# Patient Record
Sex: Female | Born: 1945 | Race: White | Hispanic: No | Marital: Married | Smoking: Never smoker
Health system: Southern US, Community
[De-identification: ages and names within clinical notes are randomized; demographics above are authoritative.]

## PROBLEM LIST (undated history)

## (undated) DIAGNOSIS — E871 Hypo-osmolality and hyponatremia: Secondary | ICD-10-CM

## (undated) DIAGNOSIS — E785 Hyperlipidemia, unspecified: Secondary | ICD-10-CM

## (undated) DIAGNOSIS — E039 Hypothyroidism, unspecified: Secondary | ICD-10-CM

## (undated) DIAGNOSIS — I10 Essential (primary) hypertension: Secondary | ICD-10-CM

## (undated) DIAGNOSIS — K635 Polyp of colon: Secondary | ICD-10-CM

## (undated) DIAGNOSIS — R001 Bradycardia, unspecified: Secondary | ICD-10-CM

## (undated) DIAGNOSIS — R609 Edema, unspecified: Secondary | ICD-10-CM

## (undated) DIAGNOSIS — Z950 Presence of cardiac pacemaker: Secondary | ICD-10-CM

## (undated) DIAGNOSIS — F419 Anxiety disorder, unspecified: Secondary | ICD-10-CM

## (undated) DIAGNOSIS — K649 Unspecified hemorrhoids: Secondary | ICD-10-CM

## (undated) DIAGNOSIS — J302 Other seasonal allergic rhinitis: Secondary | ICD-10-CM

## (undated) DIAGNOSIS — S83209A Unspecified tear of unspecified meniscus, current injury, unspecified knee, initial encounter: Secondary | ICD-10-CM

## (undated) DIAGNOSIS — M169 Osteoarthritis of hip, unspecified: Secondary | ICD-10-CM

## (undated) DIAGNOSIS — C4491 Basal cell carcinoma of skin, unspecified: Secondary | ICD-10-CM

## (undated) DIAGNOSIS — K5790 Diverticulosis of intestine, part unspecified, without perforation or abscess without bleeding: Secondary | ICD-10-CM

## (undated) DIAGNOSIS — R55 Syncope and collapse: Secondary | ICD-10-CM

## (undated) DIAGNOSIS — K219 Gastro-esophageal reflux disease without esophagitis: Secondary | ICD-10-CM

## (undated) DIAGNOSIS — N6019 Diffuse cystic mastopathy of unspecified breast: Secondary | ICD-10-CM

## (undated) DIAGNOSIS — E119 Type 2 diabetes mellitus without complications: Secondary | ICD-10-CM

## (undated) HISTORY — DX: Hypothyroidism, unspecified: E03.9

## (undated) HISTORY — DX: Basal cell carcinoma of skin, unspecified: C44.91

## (undated) HISTORY — DX: Gastro-esophageal reflux disease without esophagitis: K21.9

## (undated) HISTORY — PX: TONSILLECTOMY: SUR1361

## (undated) HISTORY — PX: OTHER SURGICAL HISTORY: SHX169

## (undated) HISTORY — PX: CHOLECYSTECTOMY: SHX55

## (undated) HISTORY — DX: Edema, unspecified: R60.9

## (undated) HISTORY — DX: Unspecified tear of unspecified meniscus, current injury, unspecified knee, initial encounter: S83.209A

## (undated) HISTORY — DX: Polyp of colon: K63.5

## (undated) HISTORY — DX: Diffuse cystic mastopathy of unspecified breast: N60.19

## (undated) HISTORY — DX: Bradycardia, unspecified: R00.1

## (undated) HISTORY — DX: Type 2 diabetes mellitus without complications: E11.9

## (undated) HISTORY — PX: KNEE ARTHROSCOPY: SUR90

## (undated) HISTORY — DX: Syncope and collapse: R55

## (undated) HISTORY — DX: Hyperlipidemia, unspecified: E78.5

## (undated) HISTORY — DX: Other seasonal allergic rhinitis: J30.2

## (undated) HISTORY — DX: Diverticulosis of intestine, part unspecified, without perforation or abscess without bleeding: K57.90

## (undated) HISTORY — DX: Essential (primary) hypertension: I10

## (undated) HISTORY — DX: Unspecified hemorrhoids: K64.9

## (undated) HISTORY — DX: Hypo-osmolality and hyponatremia: E87.1

## (undated) HISTORY — PX: TUBAL LIGATION: SHX77

## (undated) HISTORY — DX: Osteoarthritis of hip, unspecified: M16.9

---

## 1998-11-26 ENCOUNTER — Other Ambulatory Visit: Admission: RE | Admit: 1998-11-26 | Discharge: 1998-11-26 | Payer: Self-pay | Admitting: *Deleted

## 2000-04-12 ENCOUNTER — Other Ambulatory Visit: Admission: RE | Admit: 2000-04-12 | Discharge: 2000-04-12 | Payer: Self-pay | Admitting: Obstetrics and Gynecology

## 2000-05-11 ENCOUNTER — Other Ambulatory Visit: Admission: RE | Admit: 2000-05-11 | Discharge: 2000-05-11 | Payer: Self-pay | Admitting: Obstetrics and Gynecology

## 2001-02-01 ENCOUNTER — Encounter: Payer: Self-pay | Admitting: *Deleted

## 2001-02-01 ENCOUNTER — Ambulatory Visit (HOSPITAL_COMMUNITY): Admission: RE | Admit: 2001-02-01 | Discharge: 2001-02-01 | Payer: Self-pay | Admitting: *Deleted

## 2001-05-05 ENCOUNTER — Ambulatory Visit (HOSPITAL_COMMUNITY): Admission: RE | Admit: 2001-05-05 | Discharge: 2001-05-05 | Payer: Self-pay | Admitting: Family Medicine

## 2001-05-05 ENCOUNTER — Encounter: Payer: Self-pay | Admitting: Family Medicine

## 2001-06-21 ENCOUNTER — Other Ambulatory Visit: Admission: RE | Admit: 2001-06-21 | Discharge: 2001-06-21 | Payer: Self-pay | Admitting: Obstetrics and Gynecology

## 2001-10-10 ENCOUNTER — Ambulatory Visit (HOSPITAL_COMMUNITY): Admission: RE | Admit: 2001-10-10 | Discharge: 2001-10-10 | Payer: Self-pay | Admitting: *Deleted

## 2001-10-10 ENCOUNTER — Encounter: Payer: Self-pay | Admitting: *Deleted

## 2002-06-26 ENCOUNTER — Other Ambulatory Visit: Admission: RE | Admit: 2002-06-26 | Discharge: 2002-06-26 | Payer: Self-pay | Admitting: Obstetrics and Gynecology

## 2002-08-20 ENCOUNTER — Encounter: Admission: RE | Admit: 2002-08-20 | Discharge: 2002-11-18 | Payer: Self-pay | Admitting: *Deleted

## 2002-11-25 ENCOUNTER — Inpatient Hospital Stay (HOSPITAL_COMMUNITY): Admission: EM | Admit: 2002-11-25 | Discharge: 2002-11-28 | Payer: Self-pay | Admitting: Emergency Medicine

## 2002-11-25 ENCOUNTER — Encounter: Payer: Self-pay | Admitting: Emergency Medicine

## 2002-11-26 ENCOUNTER — Encounter: Payer: Self-pay | Admitting: Internal Medicine

## 2002-11-27 ENCOUNTER — Encounter: Payer: Self-pay | Admitting: Internal Medicine

## 2003-01-04 ENCOUNTER — Encounter: Payer: Self-pay | Admitting: Internal Medicine

## 2003-02-05 ENCOUNTER — Encounter: Admission: RE | Admit: 2003-02-05 | Discharge: 2003-05-06 | Payer: Self-pay | Admitting: *Deleted

## 2003-07-03 ENCOUNTER — Other Ambulatory Visit: Admission: RE | Admit: 2003-07-03 | Discharge: 2003-07-03 | Payer: Self-pay | Admitting: Obstetrics and Gynecology

## 2004-07-09 ENCOUNTER — Other Ambulatory Visit: Admission: RE | Admit: 2004-07-09 | Discharge: 2004-07-09 | Payer: Self-pay | Admitting: Obstetrics and Gynecology

## 2004-09-11 ENCOUNTER — Ambulatory Visit (HOSPITAL_COMMUNITY): Admission: RE | Admit: 2004-09-11 | Discharge: 2004-09-11 | Payer: Self-pay | Admitting: Family Medicine

## 2005-07-29 ENCOUNTER — Other Ambulatory Visit: Admission: RE | Admit: 2005-07-29 | Discharge: 2005-07-29 | Payer: Self-pay | Admitting: Obstetrics and Gynecology

## 2006-01-14 ENCOUNTER — Ambulatory Visit: Payer: Self-pay | Admitting: Internal Medicine

## 2006-03-03 ENCOUNTER — Ambulatory Visit: Payer: Self-pay | Admitting: Internal Medicine

## 2006-08-02 ENCOUNTER — Other Ambulatory Visit: Admission: RE | Admit: 2006-08-02 | Discharge: 2006-08-02 | Payer: Self-pay | Admitting: Obstetrics and Gynecology

## 2006-08-09 ENCOUNTER — Encounter: Admission: RE | Admit: 2006-08-09 | Discharge: 2006-08-09 | Payer: Self-pay | Admitting: Obstetrics and Gynecology

## 2007-02-01 ENCOUNTER — Encounter: Admission: RE | Admit: 2007-02-01 | Discharge: 2007-02-01 | Payer: Self-pay | Admitting: General Surgery

## 2007-08-03 ENCOUNTER — Other Ambulatory Visit: Admission: RE | Admit: 2007-08-03 | Discharge: 2007-08-03 | Payer: Self-pay | Admitting: Obstetrics and Gynecology

## 2007-08-30 ENCOUNTER — Encounter: Admission: RE | Admit: 2007-08-30 | Discharge: 2007-08-30 | Payer: Self-pay | Admitting: Obstetrics and Gynecology

## 2007-09-21 ENCOUNTER — Emergency Department (HOSPITAL_COMMUNITY): Admission: EM | Admit: 2007-09-21 | Discharge: 2007-09-21 | Payer: Self-pay | Admitting: Emergency Medicine

## 2008-04-04 ENCOUNTER — Ambulatory Visit (HOSPITAL_COMMUNITY): Admission: RE | Admit: 2008-04-04 | Discharge: 2008-04-04 | Payer: Self-pay | Admitting: Family Medicine

## 2008-04-26 ENCOUNTER — Ambulatory Visit (HOSPITAL_COMMUNITY): Admission: RE | Admit: 2008-04-26 | Discharge: 2008-04-26 | Payer: Self-pay | Admitting: Family Medicine

## 2008-05-01 ENCOUNTER — Ambulatory Visit: Payer: Self-pay | Admitting: Internal Medicine

## 2008-05-29 ENCOUNTER — Encounter: Payer: Self-pay | Admitting: Internal Medicine

## 2008-05-29 ENCOUNTER — Ambulatory Visit: Payer: Self-pay | Admitting: Internal Medicine

## 2008-06-07 ENCOUNTER — Encounter: Payer: Self-pay | Admitting: Internal Medicine

## 2008-08-05 ENCOUNTER — Other Ambulatory Visit: Admission: RE | Admit: 2008-08-05 | Discharge: 2008-08-05 | Payer: Self-pay | Admitting: Obstetrics and Gynecology

## 2008-08-30 ENCOUNTER — Encounter: Admission: RE | Admit: 2008-08-30 | Discharge: 2008-08-30 | Payer: Self-pay | Admitting: Obstetrics and Gynecology

## 2008-10-28 ENCOUNTER — Encounter: Payer: Self-pay | Admitting: Internal Medicine

## 2008-11-13 ENCOUNTER — Encounter: Payer: Self-pay | Admitting: Internal Medicine

## 2008-12-05 ENCOUNTER — Ambulatory Visit: Payer: Self-pay | Admitting: Internal Medicine

## 2008-12-05 DIAGNOSIS — R079 Chest pain, unspecified: Secondary | ICD-10-CM | POA: Insufficient documentation

## 2008-12-05 DIAGNOSIS — K219 Gastro-esophageal reflux disease without esophagitis: Secondary | ICD-10-CM

## 2008-12-05 DIAGNOSIS — K589 Irritable bowel syndrome without diarrhea: Secondary | ICD-10-CM | POA: Insufficient documentation

## 2009-07-03 ENCOUNTER — Ambulatory Visit: Payer: Self-pay | Admitting: Internal Medicine

## 2009-07-03 DIAGNOSIS — R109 Unspecified abdominal pain: Secondary | ICD-10-CM | POA: Insufficient documentation

## 2009-07-29 ENCOUNTER — Encounter: Admission: RE | Admit: 2009-07-29 | Discharge: 2009-07-29 | Payer: Self-pay | Admitting: Obstetrics and Gynecology

## 2009-08-06 ENCOUNTER — Other Ambulatory Visit: Admission: RE | Admit: 2009-08-06 | Discharge: 2009-08-06 | Payer: Self-pay | Admitting: Obstetrics and Gynecology

## 2009-10-06 ENCOUNTER — Telehealth: Payer: Self-pay | Admitting: Internal Medicine

## 2010-01-01 ENCOUNTER — Ambulatory Visit: Payer: Self-pay | Admitting: Internal Medicine

## 2010-01-01 DIAGNOSIS — R197 Diarrhea, unspecified: Secondary | ICD-10-CM

## 2010-01-01 DIAGNOSIS — R159 Full incontinence of feces: Secondary | ICD-10-CM

## 2010-01-01 DIAGNOSIS — B356 Tinea cruris: Secondary | ICD-10-CM | POA: Insufficient documentation

## 2010-02-19 ENCOUNTER — Ambulatory Visit: Payer: Self-pay | Admitting: Internal Medicine

## 2010-08-04 ENCOUNTER — Ambulatory Visit: Payer: Self-pay | Admitting: Internal Medicine

## 2010-08-04 DIAGNOSIS — K5900 Constipation, unspecified: Secondary | ICD-10-CM | POA: Insufficient documentation

## 2010-08-07 ENCOUNTER — Encounter: Admission: RE | Admit: 2010-08-07 | Discharge: 2010-08-07 | Payer: Self-pay | Admitting: Obstetrics and Gynecology

## 2010-08-10 ENCOUNTER — Ambulatory Visit: Payer: Self-pay | Admitting: Internal Medicine

## 2010-08-11 ENCOUNTER — Other Ambulatory Visit: Admission: RE | Admit: 2010-08-11 | Discharge: 2010-08-11 | Payer: Self-pay | Admitting: Obstetrics and Gynecology

## 2010-09-08 ENCOUNTER — Encounter: Payer: Self-pay | Admitting: Internal Medicine

## 2010-09-23 ENCOUNTER — Telehealth: Payer: Self-pay | Admitting: Internal Medicine

## 2010-10-20 ENCOUNTER — Encounter: Payer: Self-pay | Admitting: Internal Medicine

## 2010-12-03 NOTE — Progress Notes (Signed)
Summary: abnormal anorectal mano  Phone Note Outgoing Call   Summary of Call: let her know that anorectal maonometry shows abnormal function of the rectum - this can be helped by biofeedback training and a physical therapist in [place] can help her I would like to refer her for that. I am not sure if she was seen for more than anorectal mano at [facility], please clarify this and about PT referral Iva Boop MD, [facility]  September 23, 2010 5:27 PM    Follow-up for Phone Call        pt is notified of mano results.  She is unsure about PT.  She does not know what it entails and I was unable to adequately explain it to her.  Pt would also like to know if her insurance will pay for those services.  Pt states she did not see a provider at [facility], she just had the anorectal manometry. Dr. Leone Payor, who do you recommend for PT if the patient chooses to persue and do we have any info on biofeedback? Follow-up by: Francee Piccolo CMA Duncan Dull),  September 29, 2010 2:16 PM  Additional Follow-up for Phone Call Additional follow up Details #1::        I am meeting Ruben Gottron (PT) tomorrow AM to discuss options). will follow-up Iva Boop MD, [facility]  September 29, 2010 4:41 PM  LM to [facility] at home number Francee Piccolo CMA Duncan Dull)  September 30, 2010 9:50 AM   Pt notified of above and we will call her w/more info later.  Pt agreeable.  Additional Follow-up by: Francee Piccolo CMA Duncan Dull),  September 30, 2010 4:12 PM    Additional Follow-up for Phone Call Additional follow up Details #2::    I explained the biofeedback PT to patient and she is willijg to try. she would like to start before end of year if possible and [place] costs as she "will have to pay for everything" starting Jan 1. Did not guarantee that but at least perhaps she could get billing info, etc. 1) Fax an order for Physical therapy for pelvic floor dyssynergia to Ruben Gottron, PT at [facility] (fax # on card on my desk) 2)  Fax Amalia Greenhouse the last office note and the anorectal mano report.  Follow-up by: Iva Boop MD, Clementeen Graham,  October 01, 2010 6:38 PM  Additional Follow-up for Phone Call Additional follow up Details #3:: Details for Additional Follow-up Action Taken: APPT scheudled and pt notified.  Records faxed to [facility]. Additional Follow-up by: Francee Piccolo CMA Duncan Dull),  October 05, 2010 4:42 PM    MISC. Report  Procedure date:  09/08/2010  Findings:      ANORECTAL MANOMETRY:  Anal wink absent Balloon expulsion failed Good tone for squeeze on rectal pooor tone on rectal for bear down  hypotensive anal sphincter 43.1 mm Hg Squeeze anal sphincter pressure WNL 206.7 mm Hg RAIR present @10cc  Sensation @80  cc, Urge at 100 cc Discomfort at 150 cc Type I dyssynergia

## 2010-12-03 NOTE — Assessment & Plan Note (Signed)
Summary: FECAL INCONTINENCE....EM   History of Present Illness Visit Type: Follow-up Visit Primary GI MD: Stan Head MD [facility] Primary Provider: Durwin Nora, MD Requesting Provider: n/a Chief Complaint: Rectal bleeding, and fecal incontinence History of Present Illness:   65 yo woman last here in 07/2009 with abdominal wall pain that is better. Stools used to be formed but in Oct/Nov her stools changed such that the consistency is soft and formless and like melted chocolate. There is a problem cleansing adequately and it causes intermittent problems with walking due to perianal irritation from seepage.   She is ssing bright red  blood with wiping intermittently. She is not certain if she has had this before but if so it would have been years ago. She thinks fiber supplements relieved t then. There has been no change in medications or transient therapy or change in diet. She did start Benefiber in a change from chewable fiber tabs. Stopping fiber did not change this. They did firm up slightly with Benefiber.   GI Review of Systems      Denies abdominal pain, acid reflux, belching, bloating, chest pain, dysphagia with liquids, dysphagia with solids, heartburn, loss of appetite, nausea, vomiting, vomiting blood, weight loss, and  weight gain.      Reports fecal incontinence and  rectal bleeding.     Denies anal fissure, black tarry stools, change in bowel habit, constipation, diarrhea, diverticulosis, heme positive stool, hemorrhoids, irritable bowel syndrome, jaundice, light color stool, liver problems, and  rectal pain.    Current Medications (verified): 1)  Synthroid 50 Mcg Tabs (Levothyroxine Sodium) .Marland Kitchen.. 1 By Mouth Once Daily 2)  Crestor 5 Mg Tabs (Rosuvastatin Calcium) .Marland Kitchen.. 1 Tablet Mon & Thurs 3)  Adult Aspirin Ec Low Strength 81 Mg Tbec (Aspirin) .... Take 1 Tablet By Mouth Once A Day 4)  Lasix 20 Mg Tabs (Furosemide) .... As Needed 5)  Vitamin C 100 Mg Tabs (Ascorbic Acid) .... As  Directed 6)  Vitamin E 100 Unit Caps (Vitamin E) .... Take 1 Tablet By Mouth Once A Day 7)  Vitamin D 1000 Unit Tabs (Cholecalciferol) .... Take 1 Tablet By Mouth Once A Day 8)  Calcium + D 600-200 Mg-Unit Tabs (Calcium Carbonate-Vitamin D) .... Take 1 Tablet By Mouth Once A Day 9)  Azor 5-20 Mg Tabs (Amlodipine-Olmesartan) .... Take 1 Tablet By Mouth Once A Day 10)  Prilosec Otc 20 Mg Tbec (Omeprazole Magnesium) .Marland Kitchen.. 1 By Mouth Once Daily 11)  Zantac 300 Mg Tabs (Ranitidine Hcl) .Marland Kitchen.. 1 By Mouth Once Daily 12)  Benefiber  Powd (Wheat Dextrin) .... Twice Daily 13)  Sportscreme 10 % Crea (Trolamine Salicylate) .... Apply To Abdominal Wall 2 Times A Day  Allergies (verified): 1)  ! Amoxicillin 2)  ! Biaxin 3)  ! Hydrochlorothiazide  Past History:  Past Medical History: Diabetes Hyperlipidemia Hypertension Hypothyroidism Osteoarthritis GERD Irritable Bowel Syndrome Fecal Incontinence Some birth trauma with first of 3 vaginal deliveries Abdominal wall pain  Past Surgical History: Reviewed history from 07/03/2009 and no changes required. Cholecystectomy Tonsillectomy  Family History: Reviewed history from 07/03/2009 and no changes required. Family History of Pancreatic Cancer: Father Family History of Heart Disease: Brother Family History of Colon Cancer: Mother Family History of Diabetes: Uncle Family History of Liver Cancer: Mother?? mets from colon  Social History: Reviewed history from 07/03/2009 and no changes required. Patient has never smoked.  Illicit Drug Use - no Patient gets regular exercise. Alcohol Use - yes-rare Daily Caffeine Use-1 cup daily  Review of Systems       last TSH check would have been Oct and was ok as best she knows but is not certain it was checked still has intermittent chest pain related to foods and she took Nexium x few days only and then back to Prilosec  Vital Signs:  Patient profile:   65 year old female Height:      64  inches Weight:      190 pounds BMI:     32.73 BSA:     1.92 Pulse rate:   72 / minute Pulse rhythm:   regular BP sitting:   132 / 80  (left arm) Cuff size:   regular  Vitals Entered By: Ok Anis CMA (January 01, 2010 1:35 PM)  Physical Exam  General:  obese.  NAD Eyes:  anicteric Lungs:  Clear throughout to auscultation. Heart:  Regular rate and rhythm; no murmurs, rubs,  or bruits. Abdomen:  obese and non-tender no hsm or mass BS+ Rectal:  female staff present anoderm shows an erythematous perianal dermatitis slightly reduced sphincter tone at rest, no mass, soft brown stool. good voluntary squeeze. 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 TSH 2)  We will call you after we have lab results to arrange further follow up. 3)  Please pick up your medications at your pharmacy. NYSTATIN CREAM 4)  Hold your  benefiber. 5)  Copy sent to : Holley Bouche, MD 6)  The medication list was reviewed and reconciled.  All changed / newly prescribed medications were explained.  A complete medication list was provided to the patient / caregiver. Prescriptions: NYSTATIN-TRIAMCINOLONE 100000-0.1 UNIT/GM-% CREA (NYSTATIN-TRIAMCINOLONE) apply to anal area two times a day x 2 weeks and then as needed  #30 grams x 1   Entered and Authorized by:   Iva Boop MD, [facility]   Signed by:   Iva Boop MD, FACG on 01/01/2010   Method used:   Electronically to        [facility] [phone]* (retail)       [address]       [place], [place]  [address]       Ph: [phone]       Fax: [phone]   RxID:   [id number]  Patient: [name] Note: All result statuses are Final unless otherwise noted.  Tests: (1) TSH (TSH)   FastTSH  1.40 uIU/mL                 0.35-5.50  Note: An exclamation mark (!) indicates a result that was not dispersed into the flowsheet. Document Creation Date: 01/01/2010 4:36 PM

## 2010-12-03 NOTE — Assessment & Plan Note (Signed)
Summary: stomach problems....em   History of Present Illness Visit Type: Follow-up Visit Primary GI MD: Stan Head MD [facility] Primary Provider: Durwin Nora, MD Requesting Provider: n/a Chief Complaint: Follow up History of Present Illness:   Patient complains of some abdominal discomfort but nothing new to her. She states that she is having some constipation but her stools go from constipation to soft there is no median.  She gets constipated, will take a purge with citrate of Mgensia but then she begins to have fecal incontinence again and they leak out without warning. Small amount of stool intermittently. Once she notices that she will defecate and empty but still have incotinence again. she is unable to ttell me how many days of constipation there are, she says "it goes back and forth". She uses citrate of magnesia about 1x a month and is very concerned about regular laxative use. She thought that I had told her to stop Benefiber after last visit.  The perianal dermatitis comes and goes but responds to topical therapy.  while lifting a box over er head she developed a LUQ pain x 2  and has occasionally had similar problems with pushing lawnmower and sweeping Sports cream helps that but did not help much when she had the pain with the boxes.  She thinks Align is elping her "my stomach feels better when I to take  it" would like to try generic.   GI Review of Systems      Denies abdominal pain, acid reflux, belching, bloating, chest pain, dysphagia with liquids, dysphagia with solids, heartburn, loss of appetite, nausea, vomiting, vomiting blood, weight loss, and  weight gain.      Reports change in bowel habits and  constipation.     Denies anal fissure, black tarry stools, diarrhea, diverticulosis, fecal incontinence, heme positive stool, hemorrhoids, irritable bowel syndrome, jaundice, light color stool, liver problems, rectal bleeding, and  rectal pain.    Clinical Reports  Reviewed:  Colonoscopy:  05/29/2008:  Comments: 1) TWO DIMINUTIVE POLYPS REMOVED 2) SIGMOID DIVERTICULOSIS 3) VERY SMALL EXTERNAL HEMORRHOIDS 4) OTHERWISE NORMAL, EXCELLENT PREP 5) FAMILY HX COLON CANCER (ELDERLY MOM)  ***MICROSCOPIC EXAMINATION AND DIAGNOSIS***    COLON, SPLENIC FLEXURE/RECTUM, POLYPS:   - HYPERPLASTIC POLYP.   - SEPARATE PIECE OF POLYPOID COLORECTAL MUCOSA.   - THERE IS NO EVIDENCE OF MALIGNANCY.  EGD:  01/04/2003:  Comments: Mild-moderate non erosive gastritis. Otherwise normal. No cause of dysphagia seen.   Current Medications (verified): 1)  Synthroid 50 Mcg Tabs (Levothyroxine Sodium) .Marland Kitchen.. 1 By Mouth Once Daily 2)  Crestor 5 Mg Tabs (Rosuvastatin Calcium) .Marland Kitchen.. 1 Tablet Mon & Thurs 3)  Adult Aspirin Ec Low Strength 81 Mg Tbec (Aspirin) .... Take 1 Tablet By Mouth Once A Day 4)  Lasix 20 Mg Tabs (Furosemide) .... As Needed 5)  Vitamin C 100 Mg Tabs (Ascorbic Acid) .... As Directed 6)  Vitamin E 100 Unit Caps (Vitamin E) .... Take 1 Tablet By Mouth Once A Day 7)  Vitamin D 1000 Unit Tabs (Cholecalciferol) .... Take 1 Tablet By Mouth Once A Day 8)  Calcium + D 600-200 Mg-Unit Tabs (Calcium Carbonate-Vitamin D) .... Take 1 Tablet By Mouth Once A Day 9)  Azor 5-20 Mg Tabs (Amlodipine-Olmesartan) .... Take 1 Tablet By Mouth Once A Day 10)  Prilosec Otc 20 Mg Tbec (Omeprazole Magnesium) .Marland Kitchen.. 1 By Mouth Once Daily 11)  Zantac 300 Mg Tabs (Ranitidine Hcl) .Marland Kitchen.. 1 By Mouth Once Daily 12)  Benefiber  Powd (Wheat  Dextrin) .... Twice Daily--Hold 13)  Sportscreme 10 % Crea (Trolamine Salicylate) .... Apply To Abdominal Wall 2 Times A Day 14)  Align  Caps (Probiotic Product) .... One Capsule By Mouth Once Daily (Ran Out) 15)  Citrate of Magnesia  Soln (Magnesium Citrate) .... Use As Needed  Allergies (verified): 1)  ! Amoxicillin 2)  ! Biaxin 3)  ! Hydrochlorothiazide  Past History:  Past Medical History: Reviewed history from 01/01/2010 and no changes  required. 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 Cholecystectomy Tonsillectomy  Family History: Reviewed history from 07/03/2009 and no changes required. Family History of Pancreatic Cancer: Father Family History of Heart Disease: Brother Family History of Colon Cancer: Mother Family History of Diabetes: Uncle Family History of Liver Cancer: Mother?? mets from colon  Social History: Reviewed history from 07/03/2009 and no changes required. Patient has never smoked.  Illicit Drug Use - no Patient gets regular exercise. Alcohol Use - yes-rare Daily Caffeine Use-1 cup daily  Vital Signs:  Patient profile:   65 year old female Height:      64 inches Weight:      191.8 pounds BMI:     33.04 Pulse rate:   60 / minute Pulse rhythm:   regular BP sitting:   130 / 72  (left arm) Cuff size:   regular  Vitals Entered By: Harlow Mares CMA Duncan Dull) (August 04, 2010 3:55 PM)  Physical Exam  General:  obese.  NAD   Impression & Recommendations:  Problem # 1:  IRRITABLE BOWEL SYNDROME (ICD-564.1) Assessment Unchanged alternating habits continue, with periods of soft stools + fecal incontinence and then obstipation/constipation relieved by citrate of Magnesia she is fearful of regular laxatives "my mom was dependent" but is not satisfied with quality of life'either I miscommunicated about this or she misunderstood in past that regular fiber or laxatives could be what she needs - she was to resume Benefiber if constipation returmned per last note  sitz marks study planned to evaluate this will also refer to colonic motility expert for additional help in diagnostic evaluation and further therapy I will call patient once sitzmarks complete  Problem  # 2:  FULL INCONTINENCE OF 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 good voluntary squeeze. moderate rectocele  also has hx of birtrh trauma so sphincter defect at least part I suspect  will refer to colonic motility specialist as suspect ano-rectal manometry will help  Problem # 3:  ABDOMINAL WALL PAIN (ICD-789.09) Assessment: Unchanged still having from time to time  Other Orders: KUB for SITZ Marks (KUB for SITZ)  Patient Instructions: 1)  You have been given a Sitzmarks capsule to take tomorrow-08/05/10.  Please take with 8oz of water. 2)  Do not use any laxatives for the next five days. 3)  You will need to come to our [facility] department for an XRay on Monday morning. 4)  We will make a referral to Dr. Jacinto Reap at [facility].  We willl contact you with the appt date/time. 5)  We will call you with further follow up after reviewing these results. 6)  Copy sent to : Holley Bouche, MD 7)  The medication list was reviewed and reconciled.  All changed / newly prescribed  medications were explained.  A complete medication list was provided to the patient / caregiver.

## 2010-12-03 NOTE — Procedures (Signed)
Summary: Anorectal Manometry/[facility]  Anorectal Manometry/[facility]   Imported By: Sherian Rein 09/30/2010 11:37:52  _____________________________________________________________________  External Attachment:    Type:   Image     Comment:   External Document

## 2010-12-03 NOTE — Assessment & Plan Note (Signed)
Summary: 6 week follow up/sheri   History of Present Illness Visit Type: Follow-up Visit Primary GI MD: Stan Head MD [facility] Primary Provider: Durwin Nora, MD Requesting Provider: n/a Chief Complaint: 6/ week f/u, no problems with bowels the last week. Denies rectal bleeding and stool leakage. Pt states she did go see her PCP for back pain and got a bowel purge and felt better afterwards.  History of Present Illness:   65 yo woman last seen early March for fecal incontinence and anal rash. She is better since having a bowel pure for abdominal pain problems, originally thought to be a UTI. Her PCP recommended citrate of magnesia and she purgd with good results. Now no problems.  Anal irritation is improved.   GI Review of Systems      Denies abdominal pain, acid reflux, belching, bloating, chest pain, dysphagia with liquids, dysphagia with solids, heartburn, loss of appetite, nausea, vomiting, vomiting blood, weight loss, and  weight gain.        Denies anal fissure, black tarry stools, change in bowel habit, constipation, diarrhea, diverticulosis, fecal incontinence, heme positive stool, hemorrhoids, irritable bowel syndrome, jaundice, light color stool, liver problems, rectal bleeding, and  rectal pain.    Colonoscopy  Procedure date:  05/29/2008  Findings:      Results: Hemorrhoids.     Results: Diverticulosis.         Comments:      Repeat colonoscopy in 5 years. FAMILY HX COLON CANCR  EGD  Procedure date:  01/04/2003  Findings:      Findings: Gastritis     Current Medications (verified): 1)  Synthroid 50 Mcg Tabs (Levothyroxine Sodium) .Marland Kitchen.. 1 By Mouth Once Daily 2)  Crestor 5 Mg Tabs (Rosuvastatin Calcium) .Marland Kitchen.. 1 Tablet Mon & Thurs 3)  Adult Aspirin Ec Low Strength 81 Mg Tbec (Aspirin) .... Take 1 Tablet By Mouth Once A Day 4)  Lasix 20 Mg Tabs (Furosemide) .... As Needed 5)  Vitamin C 100 Mg Tabs (Ascorbic Acid) .... As Directed 6)  Vitamin E 100 Unit Caps  (Vitamin E) .... Take 1 Tablet By Mouth Once A Day 7)  Vitamin D 1000 Unit Tabs (Cholecalciferol) .... Take 1 Tablet By Mouth Once A Day 8)  Calcium + D 600-200 Mg-Unit Tabs (Calcium Carbonate-Vitamin D) .... Take 1 Tablet By Mouth Once A Day 9)  Azor 5-20 Mg Tabs (Amlodipine-Olmesartan) .... Take 1 Tablet By Mouth Once A Day 10)  Prilosec Otc 20 Mg Tbec (Omeprazole Magnesium) .Marland Kitchen.. 1 By Mouth Once Daily 11)  Zantac 300 Mg Tabs (Ranitidine Hcl) .Marland Kitchen.. 1 By Mouth Once Daily 12)  Benefiber  Powd (Wheat Dextrin) .... Twice Daily--Hold 13)  Sportscreme 10 % Crea (Trolamine Salicylate) .... Apply To Abdominal Wall 2 Times A Day 14)  Nystatin-Triamcinolone 100000-0.1 Unit/gm-% Crea (Nystatin-Triamcinolone) .... Apply To Anal Area Two Times A Day X 2 Weeks and Then As Needed 15)  Align  Caps (Probiotic Product) .... One Capsule By Mouth Once Daily  Allergies (verified): 1)  ! Amoxicillin 2)  ! Biaxin 3)  ! Hydrochlorothiazide  Past History:  Past Medical History: Reviewed history from 01/01/2010 and no changes required. 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 Cholecystectomy Tonsillectomy  Family History: Reviewed history from 07/03/2009 and no changes required. Family History of Pancreatic Cancer: Father Family History of Heart  Disease: Brother Family History of Colon Cancer: Mother Family History of Diabetes: Uncle Family History of Liver Cancer: Mother?? mets from colon  Social History: Reviewed history from 07/03/2009 and no changes required. Patient has never smoked.  Illicit Drug Use - no Patient gets regular exercise. Alcohol Use - yes-rare Daily Caffeine Use-1 cup daily  Review of Systems       weight increasing and knee pain  Vital  Signs:  Patient profile:   65 year old female Height:      64 inches Weight:      192.25 pounds BMI:     33.12 Pulse rate:   70 / minute Pulse rhythm:   regular BP sitting:   128 / 72  (right arm) Cuff size:   regular  Vitals Entered By: Christie Nottingham CMA Duncan Dull) (February 19, 2010 10:45 AM)  Physical Exam  General:  obese.  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 Maybe she had a high impaction problem. She is ok to re-introduce dietary fiber and watch for recurrent problems. If similar episodes may purge bowels - MiraLax purge instructions provided. Assessment from 3/3 is below. reduced resting sphincter tone, hx of some birth trauma so suspect that is likely cuprit though the loose stools may be primary issue now as was not having when stools were formed PMH from 12/2008 indicates IBS and fecal incontinence colonoscopy 2009 with diverticulosis, hemorrhoids and hyperplastic polyp so do not think endoscopic evaluation needed at this time  Problem # 2:  DERMATOPHYTOSIS OF GROIN AND PERIANAL AREA (ICD-110.3) Assessment: Improved She reports resolution.  Problem # 3:  IRRITABLE BOWEL SYNDROME (ICD-564.1) Assessment: Improved She can restart Benefiber when needed (if gets constipated again). Using [employer] and will reintroduce dietary fiber.  Patient Instructions: 1)  You may use the following regimen to help fture problems as discussed. 2)  Take 4 Dulcolax or Senokot tablets with a large glass of water. One (1) hour later drink Miralax 1 dose (1 packet or 1 tablespoon in 8 oz clear lquid) 6-8 times in 2-3 hours.  3)  Please schedule a follow-up appointment as needed.  4)  Copy sent to : Holley Bouche, MD 5)  The medication list was reviewed and reconciled.  All changed / newly prescribed medications were explained.  A complete medication list was  provided to the patient / caregiver. Patient: [name] Note: All result statuses are Final unless otherwise noted.  Tests: (1) TSH (TSH)   FastTSH                   1.40 uIU/mL                 0.35-5.50  Note: An exclamation mark (!) indicates a result that was not dispersed into the flowsheet. Document Creation Date: 01/01/2010 4:36 PM

## 2010-12-03 NOTE — Consult Note (Signed)
Summary: [facility]   Imported By: Sherian Rein 11/03/2010 10:55:07  _____________________________________________________________________  External Attachment:    Type:   Image     Comment:   External Document

## 2011-01-14 ENCOUNTER — Ambulatory Visit (HOSPITAL_COMMUNITY)
Admission: RE | Admit: 2011-01-14 | Discharge: 2011-01-14 | Disposition: A | Discharge status: Home / Self Care | Payer: 59 | Source: Ambulatory Visit | Attending: Cardiology | Admitting: Cardiology

## 2011-01-14 DIAGNOSIS — R079 Chest pain, unspecified: Secondary | ICD-10-CM | POA: Insufficient documentation

## 2011-01-14 LAB — GLUCOSE, CAPILLARY: Glucose-Capillary: 124 mg/dL — ABNORMAL HIGH (ref 70–99)

## 2011-01-25 NOTE — Procedures (Signed)
NAMEJAQUILLA, WOODROOF NO.:  [id number]  MEDICAL RECORD NO.:  [id number]           PATIENT TYPE:  O  LOCATION:  [facility]                         FACILITY:  [facility]  PHYSICIAN:  Jake Bathe, MD      DATE OF BIRTH:  1945-11-14  DATE OF PROCEDURE:  01/14/2011 DATE OF DISCHARGE:  01/14/2011                           CARDIAC CATHETERIZATION   PROCEDURES:  Left heart catheterization via right femoral artery, angiography, left ventriculogram, coronary angiography.  INDICATIONS:  A 65 year old female with ongoing chest discomfort who had mildly abnormal nuclear stress test showing ischemia in the apical/distal anterolateral/inferolateral segments with some T-wave inversion seen on EKG in the precordial leads.  Informed consent was obtained.  Risk of stroke, heart attack, death, renal impairment, arterial damage, bleeding were explained to the patient at length.  Radial artery access was attempted; however, nonpulsatile flow was obtained and wire was not able to pass effectively.  Radial site was aborted.  Hemostasis maintained and groin access was then performed.  Femoral visualization was performed via fluoroscopy prior to insertion of sheath.  A 5-French sheath was then inserted in the right femoral artery after lidocaine and using the Seldinger technique.  Judkins right #4 catheter and Judkins left #4 catheter were used to selectively cannulate the coronary arteries. Multiple views with hand injection of Omnipaque were obtained.  Angled pigtail was then used to crossing the left ventricle in RAO position, 30 mL left ventriculogram was performed with power injection.  Following procedure, sheath was removed.  She tolerated the procedure well without any difficulty.  FINDINGS: 1. Left main artery branched into the circumflex as well as the LAD.     No angiographically significant disease. 2. LAD - there is two very small diagonal branches proximally, then a  moderate-sized diagonal branch in the mid segment.  The LAD then     continues to the apex and takes a sharp bend in the mid-to-distal     segment and is quite small in caliber in the distal segment.  There     is no angiographically significant disease present however. 3. Circumflex artery - there are two significant obtuse marginal     branches both of which are widely patent.  No angiographically     significant disease. 4. Right coronary artery - there is no angiographically significant     disease present.  The right coronary artery distributes to the     posterior descending artery.  The mid-to-distal posterior     descending artery is quite small in caliber and tortuous.  LEFT VENTRICULOGRAM:  Normal left ventricular ejection fraction of 60% with no wall motion abnormalities.  No mitral regurgitation present. Thoracic aorta appears normal.  IMPRESSION: 1. No angiographically significant coronary artery disease. 2. Normal left ventricular ejection fraction with no wall motion     abnormalities. 3. No mitral regurgitation.  No aortic stenosis.  Hemodynamics showed left ventricular systolic pressure 135 with an end- diastolic pressure of 16 mmHg.  Aortic pressure was 135/60 with a mean of 94 mmHg.  PLAN:  Reassuring cardiac catheterization.  Abnormal nuclear stress test most likely  secondary to attenuation artifact; however, she does have small caliber distal vessels.  Nonetheless, continue with medical management.  Also with her hyponatremia which has been a recurring issue, avoid use of hydrochlorothiazide and use of furosemide.  Reduce free water intake.  I asked her to once again see Dr. Tiburcio Pea. For further evaluation and to revisit this issue.  I will see her back in followup in 1 week.     Jake Bathe, MD     MCS/MEDQ  D:  01/14/2011  T:  01/15/2011  Job:  [id number]  cc:   Melida Quitter, M.D.  Electronically Signed by Donato Schultz MD on 01/25/2011  02:33:42 PM

## 2011-03-16 NOTE — Consult Note (Signed)
NAMEMARGIE, [name] NO.:  [id number]   MEDICAL RECORD NO.:  [id number]          PATIENT TYPE:  EMS   LOCATION:  [facility]                         FACILITY:  [facility]   PHYSICIAN:  Jake Bathe, MD      DATE OF BIRTH:  September 13, 1946   DATE OF CONSULTATION:  DATE OF DISCHARGE:  09/21/2007                                 CONSULTATION   REASON FOR CONSULTATION:  Evaluation of syncope.   HISTORY OF PRESENT ILLNESS:  This is a 65 year old female whom I saw in  clinic yesterday with diabetes, hypertension, hyperlipidemia, GERD, who  yesterday increased her Azor 5/20 to 10/40, which she took last night.  This morning, she was feeling slight abdominal discomfort, went to the  bathroom, had slightly loose stool, stood up and tried to walk to her  bedroom, but passed out.  Her left side hit door knob.  She awoken after  a short time and was able to call a friend, but was having some  difficulty getting up.  They then brought her over to Dr. Tiburcio Pea'  office, who performed a EKG and blood pressure.  Her blood pressure at  that time was in the 90s to upper 80s systolic.  After discussing the  case with me, I told her to come to the [facility] Emergency Department  for further evaluation.  When she arrived here, her blood pressure was  110 systolic with the pulse rate in the 60s.  She was feeling improved.  While in the emergency room, she received cardiac enzyme, which were  normal.  She received IV fluids.  She had no chest discomfort during  this episode.  No shortness of breath.  Nausea has improved.  Diaphoresis has gone away.  Currently, feeling improved except for some  right flank pain from the fall.   PAST MEDICAL HISTORY:  As above.   PAST SURGICAL HISTORY:  Cholecystectomy and tonsillectomy.   SOCIAL HISTORY:  She lives with her husband and has a son at home.  Denies any tobacco abuse, but her husband smokes heavily.  No  significant alcohol use.  No illicit drug use.   Her friend is a critical  care nurse here with her currently.   FAMILY HISTORY:  Brother had a myocardial infarction in his 25s.  Mother  had a MI in her 31s.   REVIEW OF SYSTEMS:  No bleeding problems, flank pain, or prior syncope.  No chest pain.  No shortness of breath.  No further nausea.  Unless  explained above, all other 12 review of systems are negative.   MEDICATIONS:  1. Azor was changed from 5/20 to 10/40.  2. Metoprolol 25 mg has not been filled.  3. Synthroid 50 mcg.  4. Aspirin 81 mg.  5. Micardis has been discontinued.  6. Prilosec.  7. Fish oil.   ALLERGIES:  BIAXIN, HYDROCHLOROTHIAZIDE, and AMOXICILLIN.  HYDROCHLOROTHIAZIDE supposedly caused decreased sodium and potassium.   PHYSICAL EXAMINATION:  VITAL SIGNS:  Blood pressure currently 144/75  standing and 133/70 lying down, pulse 61, respirations 16, and  temperature 96.6.  GENERAL:  Alert and oriented x3, in no acute distress, pleasant, here  with her friend.  HEENT:  Eyes, well perfused conjunctiva.  EOMI.  No scleral icterus.  Moist mucous membranes.  NECK:  Supple.  No thyromegaly.  CARDIOVASCULAR:  Regular rate and rhythm.  No murmurs, rubs, or gallops.  LUNGS:  Clear to auscultation bilaterally.  Normal respiratory effort.  ABDOMEN:  Soft, nontender, and normal active bowel sounds.  No bruits.  EXTREMITIES:  No clubbing, cyanosis, or edema.  Distal pulses are  slightly diminished, 2+ femoral pulses.  NEUROLOGIC:  Nonfocal.  Currently normal gait.  SKIN:  There is some mild or slight bruising in the right flank area  where she fell against the door knob.   DATA:  ECG obtained today demonstrates sinus rhythm rate 60 with T-wave  inversion in V2, V3, V4, which is essentially unchanged from compared  ECG at clinic yesterday.  I personally reviewed.  Chest x-ray and rib  film personally reviewed demonstrated 2 small likely calcified  granulomas; repeat chest x-ray in 6 months.  No acute cardiopulmonary   process.   LABORATORY:  White count slightly elevated at 14.9, hemoglobin 14,  hematocrit 43, and platelets 264,000.  Sodium 127 slightly low,  potassium 4.3, CO2 26, BUN 13, creatinine 0.9, and albumin 4.4.  Liver  function normal.  CK 211, MB 5, and troponin normal.   ASSESSMENT AND PLAN:  A 65 year old female with multiple cardiac risk  factors with recent fall or syncopal episode this morning.  1. Syncope - possible etiologies include iatrogenic secondary to      doubling her Azor from 5/20 to 10/40, which she took last night.      Her blood pressure in clinic was in the 160s; however, she      apparently could not tolerate this increased dose.      Electrocardiogram is unchanged.  Telemetry has been normal.  Blood      pressures currently are normal.  No orthostatic hypotension.  She      has received approximately 1 liter of IV fluid.  Personally, I      stood her, she feels well enough to go home.  We will have close      follow up with her on Monday in clinic.  I told her to hold all antihypertensives at this time and we will likely  restart the half dose Azor on Monday.  1. Hypertension - hold all antihypertensives at this time.  We will      reevaluate on Monday in clinic.  2. Hyperlipidemia - continue current medical management.  3. Diabetes - continue current management.  4. Prevention - aspirin 81.  5. Hypothyroidism - Synthroid 50 mcg.   I have planned on doing a cardiac catheterization on Monday given her  symptom previously in clinic of stable angina.  However, I will  postponed this until stable blood pressure.  Will see in clinic Monday  AM.      Jake Bathe, MD  Electronically Signed     MCS/MEDQ  D:  09/21/2007  T:  09/22/2007  Job:  [id number]   cc:   Holley Bouche, M.D.  Osvaldo Human, M.D.

## 2011-03-19 NOTE — Consult Note (Signed)
[name], GRONER NO.:  [id number]   MEDICAL RECORD NO.:  [id number]                   PATIENT TYPE:  INP   LOCATION:  2039                                 FACILITY:  [facility]   PHYSICIAN:  Meade Maw, M.D.                 DATE OF BIRTH:  1946/07/10   DATE OF CONSULTATION:  11/26/2002  DATE OF DISCHARGE:                                   CONSULTATION   REASON FOR CONSULTATION:  Chest pain.   IMPRESSION:  1. Atypical chest pain in a 65 year old female with coronary risk factors     including age, hypertension, dyslipidemia, obesity, borderline diabetes,     and family history.  She has had a thirteen year history of substernal     chest pain which is intermittent in nature, persistent for seconds up to     four minutes.  There appears to be no aggravating or alleviating factors.     She had her first workup approximately 13 years prior by a cardiologist     who performed an ECG without further workup.  She, subsequently, had a     stress test performed by Dr. Viann Fish three years prior.  The     stress test is reportedly negative.  She presented to the hospital this     time for increasing frequency of her chest pain.  2. Hypertension, under good control.  3. Elevated liver enzymes thought to be secondary to Lipitor/TriCor.  4. Hyponatremia.  Sodium is 128.  5. Hypothyroidism.   PLAN:  The plan is to proceed with a stress Cardiolite for further  evaluation as the pain is somewhat atypical.  Her ECG changes are somewhat  diagnostic in the setting of hyponatremia.  Blood pressure is currently well-  controlled with Avapro and Atenolol.  May consider switching Atenolol to  Toprol for less adverse reaction of fatigue.  Her nitroglycerin will be  discontinued, as she has a significant headache and may use sublingual  nitroglycerin.   HISTORY OF PRESENT ILLNESS:  This is a 65 year old female with more than a  13 year history of episodic  intervals of chest pain which are described as  substernal.  Previous workup as noted above.  This is associated with  dyspnea on exertion and fatigue.  She is also noted to have T-wave  abnormalities during this admission, unable to exclude new ECG changes.  On  review of systems it is noted that she is treated with cefuroxime for a  sinus infection.  She has been told that her elevated liver enzymes are  secondary to Lipitor and TriCor.  Her Atenolol was decreased from 50 to 25.   PAST MEDICAL HISTORY:  1. Hypertension for 10 years.  2. Hypothyroidism.  3. Diabetes mellitus 5 years untreated.  Glucose is 104.  4. Dyslipidemia.  5. Hyponatremia, felt to be secondary to hydrochlorothiazide in the  past.  6. History of chest pain, status post stress Cardiolite by Dr. Donnie Aho,     within normal limits.   PAST SURGICAL HISTORY:  1. Tonsillectomy.  2. Tubal ligation.  3. Cholecystectomy.    ALLERGIES:  1. AMOXICILLIN.  2. BIAXIN.  3. HYDROCHLOROTHIAZIDE results in hyponatremia.   MEDICATIONS:  At home include:  1. Synthroid  50 mcg q.d.  2. Avapro 150 mg q.d.  3. Cefuroxime 250 mg b.i.d.  4. TriCor 160 mg q.d.  5. Atenolol 50 mg q.d.  As noted above, Atenolol has been decreased to 25 mg     q.d.  6. Protonix 40 mg q.d. has been added.  7. Lovenox 80 mg subcu. b.i.d.   SOCIAL HISTORY:  There is no history of tobacco use.  Social drinker only.  Worked as a Futures trader for 28 years.  Has 3 sons who are alive and well.   FAMILY HISTORY:  Father passed from pancreatic cancer in his 53s.  Mother  passed from myocardial infarction at 7.  One brother with myocardial  infarction at age 10.  One sister alive and well.   REVIEW OF SYMPTOMS:  As noted above.  She has also had episodic dizziness  and dysphagia.  She complains of a dry mouth, lower back pain, episodic  urinary and fecal incontinence, arthritic pain in her left shoulder.   PHYSICAL EXAMINATION:  GENERAL:  This is a  middle-aged female appearing much  older than her stated age.  She is a difficult historian.  She appears to be  confused at times and is easily distracted.  VITAL SIGNS:  Systolic pressure is 130-150/70-80.  She is afebrile.  Telemetry reveals sinus rhythm rate 47-79, primarily staying in the 50s and  60s.  HEENT:  Unremarkable.  She has good carotid upstrokes, no carotid bruits.  No neck vein distention.  PULMONARY:  Breath sounds are equal and clear to auscultation, no use of the  accessory muscles.  CARDIOVASCULAR:  Regular rate and rhythm.  No rubs, murmurs or gallops  noted.  ABDOMEN:  Soft and nontender.  No usual bruits or pulsations are noted.  EXTREMITIES:  Good distal pulses.  Motor is 5/5 throughout.  NEUROLOGIC:  As noted above.   LABORATORY DATA:  White count 6,000, hematocrit 40.8, platelet count  237,000.  Her sodium is low at 128, creatinine is 0.9, glucose is 104.  Liver function tests are normal.  Her serial CKs are slightly elevated, but  with a normal relative index.  Her troponin I's are normal.  BNP is less  than 130.  Her ECG reveals sinus bradycardia at a rate of 59 beats per  minutes.  There is an interventricular conduction delay and a right bundle  branch block morphology.  There is T-wave inversion in V1-V6.  ECG is  unchanged from ECG earlier this morning.   Dictation ended at this point.                                               Meade Maw, M.D.    HP/MEDQ  D:  11/26/2002  T:  11/26/2002  Job:  [id number]

## 2011-03-19 NOTE — Discharge Summary (Signed)
NAMEJAVONDA, SUH NO.:  [id number]   MEDICAL RECORD NO.:  [id number]                   PATIENT TYPE:  INP   LOCATION:  2039                                 FACILITY:  [facility]   PHYSICIAN:  Sherin Quarry, MD                   DATE OF BIRTH:  [birth year]   DATE OF ADMISSION:  11/25/2002  DATE OF DISCHARGE:  11/28/2002                                 DISCHARGE SUMMARY   HISTORY OF PRESENT ILLNESS:  The patient is a 65 year old woman who  initially presented to the [facility] Emergency Room on November 25, 2002  with a history that one week prior to that time, she had developed the  findings of a sinus infection which was treated with ciprofloxacin.  She had  complaints of tiredness and her blood pressure medication was changed from a  beta blocker to an ACE inhibitor because of findings of bradycardia.  Subsequently, the blood pressure medicine was switched from an ACE inhibitor  to an ARB secondary to generalized complaints of malaise.  Subsequently, the  patient had intermittent complaints of chest pain, shoulder pain and back  pain; this was not associated with any dyspnea, orthopnea or PND.  There was  no associated nausea, vomiting or diaphoresis.   PHYSICAL EXAMINATION:  On physical exam, her blood pressure was 144/75,  pulse was 53, respirations 20.  O2 saturation was 100%.  HEENT exam was  within normal limits.  The chest was clear.  Cardiovascular exam revealed  normal S1 and S2 without murmurs, rubs, or gallops.  The abdomen was benign.  Neurologic testing and examination of extremities was normal according to  Dr. Deirdre Peer. Polite's examination.   LABORATORY AND ACCESSORY CLINICAL DATA:  Electrocardiogram showed a normal  sinus rhythm.  There was a left axis deviation and nonspecific ST and T wave  changes were identified.   Laboratory studies:  The sodium was 128, potassium 4.1, creatinine was 0.8,  BUN was 7 and glucose was 94.  CBC  revealed a hemoglobin of 13.3, white  count was 5400. Liver profile was normal.  The CPK was initially 383 with an  MB fraction of 7.3, troponin was 0.01 on several determinations and  subsequent CPKs came down; the values were sequentially 371, 294 and 233.   HOSPITAL COURSE:  Consultation was obtained from Dr. Meade Maw of the  cardiology service.  It was her impression that the patient's chest pain  should be viewed seriously in light of her risk factors which include age,  hypertension, dyslipidemia, obesity and borderline diabetes.  She  recommended proceeding with a Cardiolite study.  Ultimately, the Cardiolite  study was done on November 27, 2002; it showed normal perfusion, ejection  fraction was 65%.  It was Dr. Lindell Spar conclusion that the patient's  complaints were not secondary to an underlying cardiac problem.  On November 28, 2002, it was felt reasonable to discharge the patient.   DISCHARGE DIAGNOSES:  1. Atypical chest pain not of cardiac origin, possibly secondary to     gastroesophageal reflux.  2. Mild diabetes.  3. Hypertension.  4. Hypothyroidism.  5. Chronic bradycardia.   DISCHARGE MEDICATIONS:  1. In light of the patient's persistent bradycardia, I thought it would be     advisable to continue to withhold the atenolol.  Her blood pressure will     need to be closely monitored.  2. We will continue her on Avapro at a dose of 150 mg daily.  3. She will also continue Synthroid 50 mcg daily and Tricor 160 mg daily.   FOLLOWUP:  The patient indicates that she already has a return appointment  with Dr. Tama Headings. Marina Goodell, which will be appropriate as it will be necessary  to keep a close eye on her blood pressure.  She expresses persistent concern  about her sodium level and it would be prudent to check this again on her  next visit; the last sodium level prior to discharge was 133.   CONDITION ON DISCHARGE:  The patient's condition at the time of discharge   was good.                                               Sherin Quarry, MD    SY/MEDQ  D:  11/28/2002  T:  11/28/2002  Job:  [id number]   cc:   Meade Maw, M.D.  [address]  [place]  [place] [address]  Fax: [phone]   Tama Headings. Marina Goodell, M.D.  [address]  [place]  [place] [address]  Fax: [phone]

## 2011-04-28 ENCOUNTER — Other Ambulatory Visit: Payer: Self-pay | Admitting: Internal Medicine

## 2011-04-28 ENCOUNTER — Ambulatory Visit
Admission: RE | Admit: 2011-04-28 | Discharge: 2011-04-28 | Disposition: A | Discharge status: Home / Self Care | Source: Ambulatory Visit | Attending: Internal Medicine | Admitting: Internal Medicine

## 2011-04-28 DIAGNOSIS — R0609 Other forms of dyspnea: Secondary | ICD-10-CM

## 2011-04-28 DIAGNOSIS — R0989 Other specified symptoms and signs involving the circulatory and respiratory systems: Secondary | ICD-10-CM

## 2011-07-21 ENCOUNTER — Other Ambulatory Visit: Payer: Self-pay | Admitting: Obstetrics and Gynecology

## 2011-07-21 DIAGNOSIS — Z1231 Encounter for screening mammogram for malignant neoplasm of breast: Secondary | ICD-10-CM

## 2011-08-09 ENCOUNTER — Ambulatory Visit
Admission: RE | Admit: 2011-08-09 | Discharge: 2011-08-09 | Disposition: A | Discharge status: Home / Self Care | Payer: 59 | Source: Ambulatory Visit | Attending: Obstetrics and Gynecology | Admitting: Obstetrics and Gynecology

## 2011-08-09 DIAGNOSIS — Z1231 Encounter for screening mammogram for malignant neoplasm of breast: Secondary | ICD-10-CM

## 2011-08-10 LAB — DIFFERENTIAL
Lymphocytes Relative: 7 — ABNORMAL LOW
Lymphs Abs: 1.1
Monocytes Relative: 3
Neutro Abs: 13.4 — ABNORMAL HIGH
Neutrophils Relative %: 90 — ABNORMAL HIGH

## 2011-08-10 LAB — CBC
MCHC: 34.3
Platelets: 264
RBC: 4.96

## 2011-08-10 LAB — COMPREHENSIVE METABOLIC PANEL
BUN: 13
CO2: 26
Calcium: 9.4
Creatinine, Ser: 0.97
GFR calc non Af Amer: 59 — ABNORMAL LOW
Glucose, Bld: 140 — ABNORMAL HIGH
Sodium: 127 — ABNORMAL LOW
Total Protein: 7.5

## 2011-08-10 LAB — PROTIME-INR
INR: 0.9
Prothrombin Time: 12.4

## 2011-08-10 LAB — CK TOTAL AND CKMB (NOT AT ARMC)
CK, MB: 5 — ABNORMAL HIGH
Total CK: 211 — ABNORMAL HIGH

## 2011-09-03 ENCOUNTER — Ambulatory Visit (HOSPITAL_BASED_OUTPATIENT_CLINIC_OR_DEPARTMENT_OTHER)
Admission: RE | Admit: 2011-09-03 | Discharge: 2011-09-03 | Disposition: A | Discharge status: Home / Self Care | Payer: 59 | Source: Ambulatory Visit | Attending: Specialist | Admitting: Specialist

## 2011-09-03 DIAGNOSIS — M1710 Unilateral primary osteoarthritis, unspecified knee: Secondary | ICD-10-CM | POA: Insufficient documentation

## 2011-09-03 DIAGNOSIS — M23302 Other meniscus derangements, unspecified lateral meniscus, unspecified knee: Secondary | ICD-10-CM | POA: Insufficient documentation

## 2011-09-03 DIAGNOSIS — Z01812 Encounter for preprocedural laboratory examination: Secondary | ICD-10-CM | POA: Insufficient documentation

## 2011-09-03 DIAGNOSIS — M23329 Other meniscus derangements, posterior horn of medial meniscus, unspecified knee: Secondary | ICD-10-CM | POA: Insufficient documentation

## 2011-09-03 LAB — POCT I-STAT 4, (NA,K, GLUC, HGB,HCT): Glucose, Bld: 133 mg/dL — ABNORMAL HIGH (ref 70–99)

## 2011-09-03 LAB — GLUCOSE, CAPILLARY: Glucose-Capillary: 85 mg/dL (ref 70–99)

## 2011-09-11 NOTE — Op Note (Signed)
[name], FUELLING NO.:  [id number]  MEDICAL RECORD NO.:  [id number]  LOCATION:                                 FACILITY:  PHYSICIAN:  Jene Every, M.D.         DATE OF BIRTH:  DATE OF PROCEDURE:  09/03/2011 DATE OF DISCHARGE:                              OPERATIVE REPORT   PREOPERATIVE DIAGNOSIS:  Medial and lateral meniscus tear, degenerative joint disease of the right knee.  POSTOPERATIVE DIAGNOSIS:  Medial and lateral meniscus tear, degenerative joint disease of the right knee, extensive grade 3 changes of the medial femoral condyle, medial tibial plateau, patellofemoral arthrosis, minor grade 4 changes on the sulcus.  PROCEDURE PERFORMED:  Right knee arthroscopy, partial medial and lateral meniscectomies, chondroplasty of medial femoral condyle, patella femoral sulcus, partial lateral meniscectomy.  ANESTHESIA:  General.  No assistant.  BRIEF HISTORY:  A 65 year old with knee pain refractory to conservative treatment.  MRI indicating meniscus tear, tricompartmental degenerative changes, medial joint space narrowing.  She was indicated for arthroscopic debridement failing conservative treatment including corticosteroid injection, activity modification, analgesics.  Risk and benefits discussed including bleeding, infection, damage to neurovascular structures, no change in symptoms, worsening symptoms, need for repeat debridement, DVT, PE, anesthetic complications, etc., need for total knee.  TECHNIQUE:  Patient in supine position after induction of adequate anesthesia, 1 g vancomycin, right lower extremity prepped and draped in the usual sterile fashion.  A lateral parapatellar portal was fashioned with #11 blade.  Ingress cannula atraumatically placed.  Irrigant was utilized to insufflate the joint.  Under direct visualization, medial parapatellar portal was fashioned with #11 blade after localization with an 18-gauge needle sparing the  medial meniscus.  Noted medial compartment with extensive grade 3 changes, chondral flap tear of the femoral condyle, tearing of the posterior half of the medial meniscus, radial tearing, grade 3 changes of the tibial plateau.  A 2.5 shaver introduced and utilized to perform a light chondroplasty of femoral condyle and tibial plateau and debrided the meniscus to a stable base. Further contoured with a straight basket.  Approximately 20% posterior half of the size.  The remnant was stable to probe palpation.  ACL was unremarkable.  Lateral compartment revealed minor grade 2 changes of the femoral condyle and tibial plateau, radial tearing of the lateral meniscus.  The anterior horn shaver was introduced and utilized to perform partial lateral meniscectomy to a stable base.  Suprapatellar pouch revealed extensive grade 3 changes of the patellofemoral joint, sulcus patella.  Normal patellofemoral tracking, small grade 4 lesion there.  Light chondroplasty performed here. Gutters were unremarkable, reexamined all compartments.  No furtherpathology amenable to arthroscopic intervention.  We evacuated fair amount of loose cartilaginous debris.  Instrumentation was removed.  Portals were closed with 4-0 nylon simple sutures.  0.25% Marcaine with epinephrine was infiltrated in the joint. Wound was dressed sterilely.  Awoken without difficulty and transported to recovery room in satisfactory condition.  Patient tolerated the procedure well.  No complications.  No assistant. Minimal blood loss.     Jene Every, M.D.     Cordelia Pen  D:  09/03/2011  T:  09/04/2011  Job:  [id number]  Electronically Signed by Jene Every M.D. on 09/11/2011 06:54:27 AM

## 2012-07-21 ENCOUNTER — Other Ambulatory Visit: Payer: Self-pay | Admitting: Obstetrics and Gynecology

## 2012-07-21 DIAGNOSIS — Z1231 Encounter for screening mammogram for malignant neoplasm of breast: Secondary | ICD-10-CM

## 2012-08-09 ENCOUNTER — Ambulatory Visit
Admission: RE | Admit: 2012-08-09 | Discharge: 2012-08-09 | Disposition: A | Discharge status: Home / Self Care | Payer: 59 | Source: Ambulatory Visit | Attending: Obstetrics and Gynecology | Admitting: Obstetrics and Gynecology

## 2012-08-09 DIAGNOSIS — Z1231 Encounter for screening mammogram for malignant neoplasm of breast: Secondary | ICD-10-CM

## 2012-10-11 DIAGNOSIS — B373 Candidiasis of vulva and vagina: Secondary | ICD-10-CM | POA: Diagnosis not present

## 2012-10-11 DIAGNOSIS — N390 Urinary tract infection, site not specified: Secondary | ICD-10-CM | POA: Diagnosis not present

## 2012-10-11 DIAGNOSIS — E871 Hypo-osmolality and hyponatremia: Secondary | ICD-10-CM | POA: Diagnosis not present

## 2012-12-29 DIAGNOSIS — I498 Other specified cardiac arrhythmias: Secondary | ICD-10-CM | POA: Diagnosis not present

## 2012-12-29 DIAGNOSIS — I10 Essential (primary) hypertension: Secondary | ICD-10-CM | POA: Diagnosis not present

## 2013-02-27 DIAGNOSIS — E039 Hypothyroidism, unspecified: Secondary | ICD-10-CM | POA: Diagnosis not present

## 2013-02-27 DIAGNOSIS — E785 Hyperlipidemia, unspecified: Secondary | ICD-10-CM | POA: Diagnosis not present

## 2013-02-27 DIAGNOSIS — E119 Type 2 diabetes mellitus without complications: Secondary | ICD-10-CM | POA: Diagnosis not present

## 2013-02-27 DIAGNOSIS — Z23 Encounter for immunization: Secondary | ICD-10-CM | POA: Diagnosis not present

## 2013-02-27 DIAGNOSIS — E871 Hypo-osmolality and hyponatremia: Secondary | ICD-10-CM | POA: Diagnosis not present

## 2013-02-27 DIAGNOSIS — I10 Essential (primary) hypertension: Secondary | ICD-10-CM | POA: Diagnosis not present

## 2013-05-08 ENCOUNTER — Encounter: Payer: Self-pay | Admitting: Internal Medicine

## 2013-06-26 DIAGNOSIS — G43909 Migraine, unspecified, not intractable, without status migrainosus: Secondary | ICD-10-CM | POA: Diagnosis not present

## 2013-06-26 DIAGNOSIS — E119 Type 2 diabetes mellitus without complications: Secondary | ICD-10-CM | POA: Diagnosis not present

## 2013-07-04 DIAGNOSIS — R748 Abnormal levels of other serum enzymes: Secondary | ICD-10-CM | POA: Diagnosis not present

## 2013-07-04 DIAGNOSIS — I498 Other specified cardiac arrhythmias: Secondary | ICD-10-CM | POA: Diagnosis not present

## 2013-07-04 DIAGNOSIS — E785 Hyperlipidemia, unspecified: Secondary | ICD-10-CM | POA: Diagnosis not present

## 2013-07-04 DIAGNOSIS — E119 Type 2 diabetes mellitus without complications: Secondary | ICD-10-CM | POA: Diagnosis not present

## 2013-07-04 DIAGNOSIS — E871 Hypo-osmolality and hyponatremia: Secondary | ICD-10-CM | POA: Diagnosis not present

## 2013-07-04 DIAGNOSIS — I10 Essential (primary) hypertension: Secondary | ICD-10-CM | POA: Diagnosis not present

## 2013-07-10 ENCOUNTER — Other Ambulatory Visit: Payer: Self-pay

## 2013-07-10 DIAGNOSIS — Z1231 Encounter for screening mammogram for malignant neoplasm of breast: Secondary | ICD-10-CM

## 2013-08-10 ENCOUNTER — Ambulatory Visit
Admission: RE | Admit: 2013-08-10 | Discharge: 2013-08-10 | Disposition: A | Discharge status: Home / Self Care | Payer: Medicare Other | Source: Ambulatory Visit

## 2013-08-10 DIAGNOSIS — Z1231 Encounter for screening mammogram for malignant neoplasm of breast: Secondary | ICD-10-CM | POA: Diagnosis not present

## 2013-08-21 ENCOUNTER — Other Ambulatory Visit: Payer: Self-pay | Admitting: Nurse Practitioner

## 2013-08-21 ENCOUNTER — Other Ambulatory Visit (HOSPITAL_COMMUNITY)
Admission: RE | Admit: 2013-08-21 | Discharge: 2013-08-21 | Disposition: A | Discharge status: Home / Self Care | Payer: Medicare Other | Source: Ambulatory Visit | Attending: Nurse Practitioner | Admitting: Nurse Practitioner

## 2013-08-21 DIAGNOSIS — Z1151 Encounter for screening for human papillomavirus (HPV): Secondary | ICD-10-CM | POA: Insufficient documentation

## 2013-08-21 DIAGNOSIS — N760 Acute vaginitis: Secondary | ICD-10-CM | POA: Diagnosis not present

## 2013-08-21 DIAGNOSIS — Z01419 Encounter for gynecological examination (general) (routine) without abnormal findings: Secondary | ICD-10-CM | POA: Diagnosis not present

## 2013-08-21 DIAGNOSIS — R300 Dysuria: Secondary | ICD-10-CM | POA: Diagnosis not present

## 2013-08-29 DIAGNOSIS — N390 Urinary tract infection, site not specified: Secondary | ICD-10-CM | POA: Diagnosis not present

## 2013-08-29 DIAGNOSIS — I10 Essential (primary) hypertension: Secondary | ICD-10-CM | POA: Diagnosis not present

## 2013-08-29 DIAGNOSIS — E119 Type 2 diabetes mellitus without complications: Secondary | ICD-10-CM | POA: Diagnosis not present

## 2013-08-29 DIAGNOSIS — Z23 Encounter for immunization: Secondary | ICD-10-CM | POA: Diagnosis not present

## 2013-08-29 DIAGNOSIS — E039 Hypothyroidism, unspecified: Secondary | ICD-10-CM | POA: Diagnosis not present

## 2013-08-29 DIAGNOSIS — E785 Hyperlipidemia, unspecified: Secondary | ICD-10-CM | POA: Diagnosis not present

## 2013-08-29 DIAGNOSIS — K219 Gastro-esophageal reflux disease without esophagitis: Secondary | ICD-10-CM | POA: Diagnosis not present

## 2013-08-30 DIAGNOSIS — D235 Other benign neoplasm of skin of trunk: Secondary | ICD-10-CM | POA: Diagnosis not present

## 2013-09-13 ENCOUNTER — Encounter: Payer: Self-pay | Admitting: Cardiology

## 2013-10-23 DIAGNOSIS — R350 Frequency of micturition: Secondary | ICD-10-CM | POA: Diagnosis not present

## 2013-10-30 DIAGNOSIS — R3915 Urgency of urination: Secondary | ICD-10-CM | POA: Diagnosis not present

## 2013-11-15 DIAGNOSIS — R3915 Urgency of urination: Secondary | ICD-10-CM | POA: Diagnosis not present

## 2013-11-15 DIAGNOSIS — R300 Dysuria: Secondary | ICD-10-CM | POA: Diagnosis not present

## 2013-12-10 DIAGNOSIS — J329 Chronic sinusitis, unspecified: Secondary | ICD-10-CM | POA: Diagnosis not present

## 2013-12-10 DIAGNOSIS — H01009 Unspecified blepharitis unspecified eye, unspecified eyelid: Secondary | ICD-10-CM | POA: Diagnosis not present

## 2014-01-02 ENCOUNTER — Ambulatory Visit: Payer: 59 | Admitting: Cardiology

## 2014-01-07 ENCOUNTER — Ambulatory Visit (INDEPENDENT_AMBULATORY_CARE_PROVIDER_SITE_OTHER): Payer: Medicare Other | Admitting: Cardiology

## 2014-01-07 ENCOUNTER — Encounter: Payer: Self-pay | Admitting: Cardiology

## 2014-01-07 VITALS — BP 138/74 | HR 55 | Ht 64.5 in | Wt 191.0 lb

## 2014-01-07 DIAGNOSIS — E669 Obesity, unspecified: Secondary | ICD-10-CM | POA: Diagnosis not present

## 2014-01-07 DIAGNOSIS — M549 Dorsalgia, unspecified: Secondary | ICD-10-CM

## 2014-01-07 DIAGNOSIS — I10 Essential (primary) hypertension: Secondary | ICD-10-CM

## 2014-01-07 NOTE — Patient Instructions (Signed)
Your physician recommends that you continue on your current medications as directed. Please refer to the Current Medication list given to you today.  Your physician wants you to follow-up in: 6 months with Dr. Skains. You will receive a reminder letter in the mail two months in advance. If you don't receive a letter, please call our office to schedule the follow-up appointment.  

## 2014-01-07 NOTE — Progress Notes (Signed)
[facility]. [address], [place], [place]  [address] Phone: [phone] Fax:  [phone]  Date:  01/07/2014   ID:  [name], DOB 1946/02/01, MRN [id number]  PCP:  Default, Provider, MD   History of Present Illness: [name] is a 68 y.o. female with hyperlipidemia, hypertension, prior syncope due to elevated Azor dose, prior hyponatremia on a mild diuretic cardiac catheterization with no angiographically significant coronary artery disease 3/12.  BP can be elevated at times with stomach issues. 170 at times. Some head aches. Occasional light headed issues. Dr. Kenton Kingfisher has tried to change medications.  CPK 300's. Stable. Now taking once a day. No prior MI. +DM, no prior CVA. No early CAD. +HTN. Dr. Kenton Kingfisher wants to maintain Crestor because of DM.    Wt Readings from Last 3 Encounters:  01/07/14 191 lb (86.637 kg)  08/04/10 191 lb 12.8 oz (87 kg)  02/19/10 192 lb 4 oz (87.204 kg)     Past Medical History  Diagnosis Date  . Syncope   . Hyperlipidemia   . HTN (hypertension)   . Diabetes   . Hypothyroidism   . Esophageal reflux   . Seasonal allergies   . Fibrocystic breast   . OA (osteoarthritis) of hip   . Hemorrhoids   . Colon polyp   . Diverticulosis   . Basal cell carcinoma   . Acute meniscal tear of knee     No past surgical history on file.  Current Outpatient Prescriptions  Medication Sig Dispense Refill  . amLODipine (NORVASC) 5 MG tablet Take 5 mg by mouth daily.      . Ascorbic Acid (VITAMIN C) 100 MG tablet Take 100 mg by mouth daily.      Marland Kitchen aspirin 81 MG tablet Take 81 mg by mouth daily.      . Calcium Carbonate-Vitamin D (CALCIUM-D) 600-400 MG-UNIT TABS Take 1 tablet by mouth daily.      . fluticasone (FLONASE) 50 MCG/ACT nasal spray Place 2 sprays into both nostrils as needed for allergies or rhinitis.      Marland Kitchen levothyroxine (SYNTHROID, LEVOTHROID) 50 MCG tablet Take 50 mcg by mouth daily before breakfast.      . losartan (COZAAR) 50 MG  tablet Take 50 mg by mouth daily.      Marland Kitchen omeprazole (PRILOSEC) 40 MG capsule Take 40 mg by mouth daily.      . rosuvastatin (CRESTOR) 5 MG tablet Take 5 mg by mouth daily.      . vitamin E 100 UNIT capsule Take by mouth daily.       No current facility-administered medications for this visit.    Allergies:    Allergies  Allergen Reactions  . Amoxicillin     REACTION: Rash  . Clarithromycin     REACTION: Nausea and vomiting  . Hydrochlorothiazide     REACTION: Decreased Potassium and decreased Sodium  . Lipitor [Atorvastatin] Other (See Comments)    CPK  . Niaspan [Niacin Er] Other (See Comments)    CPK  . Triglide [Fenofibrate] Other (See Comments)    CPK  . Welchol [Colesevelam Hcl] Other (See Comments)    CPK  . Zetia [Ezetimibe] Other (See Comments)    CPK   . Zostavax [Zoster Vaccine Live]     Social History:  The patient  reports that she has never smoked. She does not have any smokeless tobacco history on file. She reports that she drinks alcohol. She reports  that she does not use illicit drugs.   ROS:  Please see the history of present illness.   Back pain behind shoulder blade after walk.     PHYSICAL EXAM: VS:  BP 138/74  Pulse 55  Ht 5' 4.5" (1.638 m)  Wt 191 lb (86.637 kg)  BMI 32.29 kg/m2 Well nourished, well developed, in no acute distress HEENT: normal Neck: no JVD Cardiac:  normal S1, S2; RRR; no murmur Lungs:  clear to auscultation bilaterally, no wheezing, rhonchi or rales Abd: soft, nontender, no hepatomegaly Ext: no edema Skin: warm and dry Neuro: no focal abnormalities noted  EKG:  None today. Previously sinus bradycardia 50, T wave inversion.  ASSESSMENT AND PLAN:  1. Hypertension-currently well controlled. Continue with current medications. Prior syncope with Azor. 2. Mid back/scapular pain - likely musculoskeletal. She is concerned because her mother had mid back pain prior to her heart attack. We did cardiac catheterization which was  normal. Reassurance. Primary prevention. 3. Obesity-struggles with weight loss. Trying to decrease carbohydrates. She is now taking her husband to the mall to walk. Continue. Excellent. 4. Elevated CPK-Dr. Kenton Kingfisher has been monitoring closely. Continuing with low-dose Crestor.  Signed, Candee Furbish, MD [facility]  01/07/2014 9:11 AM

## 2014-02-27 DIAGNOSIS — N390 Urinary tract infection, site not specified: Secondary | ICD-10-CM | POA: Diagnosis not present

## 2014-02-27 DIAGNOSIS — K649 Unspecified hemorrhoids: Secondary | ICD-10-CM | POA: Diagnosis not present

## 2014-02-27 DIAGNOSIS — E039 Hypothyroidism, unspecified: Secondary | ICD-10-CM | POA: Diagnosis not present

## 2014-02-27 DIAGNOSIS — E119 Type 2 diabetes mellitus without complications: Secondary | ICD-10-CM | POA: Diagnosis not present

## 2014-02-27 DIAGNOSIS — E785 Hyperlipidemia, unspecified: Secondary | ICD-10-CM | POA: Diagnosis not present

## 2014-02-27 DIAGNOSIS — I10 Essential (primary) hypertension: Secondary | ICD-10-CM | POA: Diagnosis not present

## 2014-05-16 DIAGNOSIS — R079 Chest pain, unspecified: Secondary | ICD-10-CM | POA: Diagnosis not present

## 2014-05-16 DIAGNOSIS — R1013 Epigastric pain: Secondary | ICD-10-CM | POA: Diagnosis not present

## 2014-06-13 DIAGNOSIS — K219 Gastro-esophageal reflux disease without esophagitis: Secondary | ICD-10-CM | POA: Diagnosis not present

## 2014-07-02 ENCOUNTER — Ambulatory Visit (INDEPENDENT_AMBULATORY_CARE_PROVIDER_SITE_OTHER): Payer: Medicare Other | Admitting: Cardiology

## 2014-07-02 ENCOUNTER — Encounter: Payer: Self-pay | Admitting: Cardiology

## 2014-07-02 VITALS — BP 150/84 | HR 64 | Ht 65.0 in | Wt 190.0 lb

## 2014-07-02 DIAGNOSIS — R9431 Abnormal electrocardiogram [ECG] [EKG]: Secondary | ICD-10-CM | POA: Insufficient documentation

## 2014-07-02 DIAGNOSIS — I10 Essential (primary) hypertension: Secondary | ICD-10-CM

## 2014-07-02 DIAGNOSIS — R748 Abnormal levels of other serum enzymes: Secondary | ICD-10-CM | POA: Diagnosis not present

## 2014-07-02 DIAGNOSIS — R0789 Other chest pain: Secondary | ICD-10-CM | POA: Diagnosis not present

## 2014-07-02 DIAGNOSIS — E119 Type 2 diabetes mellitus without complications: Secondary | ICD-10-CM | POA: Diagnosis not present

## 2014-07-02 DIAGNOSIS — E669 Obesity, unspecified: Secondary | ICD-10-CM

## 2014-07-02 MED ORDER — LOSARTAN POTASSIUM 50 MG PO TABS: 75.0000 mg | ORAL_TABLET | Freq: Every day | ORAL | Status: DC

## 2014-07-02 NOTE — Progress Notes (Signed)
[facility]. [address] [place], [place]  [address] Phone: [phone] Fax:  [phone]  Date:  07/02/2014   ID:  [name], DOB 1946-07-23, MRN [id number]  PCP:  Shirline Frees, MD   History of Present Illness: [name] is a 68 y.o. female with hyperlipidemia, hypertension, prior syncope due to elevated Azor dose, prior hyponatremia on a mild diuretic cardiac catheterization with no angiographically significant coronary artery disease 3/12.   CPK 290-300's. Stable. Now taking once a day. No prior MI. +DM, no prior CVA. No early CAD. +HTN. Crestor because of DM.  Had an issue with stomach pain. PPI seems to be helping. When she has heartburn, shoulder pain occurs, back pain. Challenging for her.    Wt Readings from Last 3 Encounters:  07/02/14 190 lb (86.183 kg)  01/07/14 191 lb (86.637 kg)  08/04/10 191 lb 12.8 oz (87 kg)     Past Medical History  Diagnosis Date  . Syncope   . Hyperlipidemia   . HTN (hypertension)   . Diabetes   . Hypothyroidism   . Esophageal reflux   . Seasonal allergies   . Fibrocystic breast   . OA (osteoarthritis) of hip   . Hemorrhoids   . Colon polyp   . Diverticulosis   . Basal cell carcinoma   . Acute meniscal tear of knee     No past surgical history on file.  Current Outpatient Prescriptions  Medication Sig Dispense Refill  . amLODipine (NORVASC) 5 MG tablet Take 5 mg by mouth daily.      . Ascorbic Acid (VITAMIN C) 100 MG tablet Take 100 mg by mouth daily.      Marland Kitchen aspirin 81 MG tablet Take 81 mg by mouth daily.      . Calcium Carbonate-Vitamin D (CALCIUM-D) 600-400 MG-UNIT TABS Take 1 tablet by mouth daily.      . fluticasone (FLONASE) 50 MCG/ACT nasal spray Place 2 sprays into both nostrils as needed for allergies or rhinitis.      Marland Kitchen levothyroxine (SYNTHROID, LEVOTHROID) 50 MCG tablet Take 50 mcg by mouth daily before breakfast.      . losartan (COZAAR) 50 MG tablet Take 50 mg by mouth daily.      Marland Kitchen  omeprazole (PRILOSEC) 40 MG capsule Take 40 mg by mouth daily.      . ranitidine (ZANTAC) 300 MG tablet Take 300 mg by mouth at bedtime.      . rosuvastatin (CRESTOR) 5 MG tablet Take 5 mg by mouth daily.      . vitamin E 100 UNIT capsule Take by mouth daily.       No current facility-administered medications for this visit.    Allergies:    Allergies  Allergen Reactions  . Amoxicillin     REACTION: Rash  . Clarithromycin     REACTION: Nausea and vomiting  . Hydrochlorothiazide     REACTION: Decreased Potassium and decreased Sodium  . Lipitor [Atorvastatin] Other (See Comments)    CPK  . Niaspan [Niacin Er] Other (See Comments)    CPK  . Triglide [Fenofibrate] Other (See Comments)    CPK  . Welchol [Colesevelam Hcl] Other (See Comments)    CPK  . Zetia [Ezetimibe] Other (See Comments)    CPK   . Zostavax [Zoster Vaccine Live]     Social History:  The patient  reports that she has never smoked. She does not have any smokeless tobacco history on  file. She reports that she drinks alcohol. She reports that she does not use illicit drugs.   ROS:  Please see the history of present illness.   Back pain behind shoulder blade after walk.     PHYSICAL EXAM: VS:  BP 150/84  Pulse 64  Ht 5\' 5"  (1.651 m)  Wt 190 lb (86.183 kg)  BMI 31.62 kg/m2 Well nourished, well developed, in no acute distress HEENT: normal Neck: no JVD Cardiac:  normal S1, S2; RRR; no murmur Lungs:  clear to auscultation bilaterally, no wheezing, rhonchi or rales Abd: soft, nontender, no hepatomegaly Ext: no edema Skin: warm and dry Neuro: no focal abnormalities noted  EKG:  None today. 7/15 Previously sinus bradycardia 50, T wave inversion. No change from prior.  ASSESSMENT AND PLAN:  1. Abnormal ECG - with her history of HTN, deep T wave inversion, will check ECG to make sure no evidence of hypertrophic cardiomyopathy.  2. Hypertension-currently elevated. Was elevated at last doctor's office visit as  well. Will increase losartan to 75mg . She is seeing Dr. Kenton Kingfisher in one month. If this is working well, he can continue with 75 mg prescribed. If still elevated, increase to 100 mg. Prior syncope with Azor. 3. Prior atypical chest pain-previously has been helped with proton pump inhibitor. Reassuring cardiac catheterization in 2012. We discussed once again. 4. Obesity-struggles with weight loss. Trying to decrease carbohydrates. Challenging for her. Her weight is not changing. 5. Hyperlipidemia - low dose statin.  6. Elevated CPK-Dr. Kenton Kingfisher has been monitoring closely. Continuing with low-dose Crestor. Once yearly check seems reasonable.  7. One year follow up.  Signed, Candee Furbish, MD [facility]  07/02/2014 3:29 PM

## 2014-07-02 NOTE — Patient Instructions (Signed)
Please increase your Cozaar to 75 mg a day (1 and 1/2 tablets) Continue all other medications as listed.  Your physician has requested that you have an echocardiogram. Echocardiography is a painless test that uses sound waves to create images of your heart. It provides your doctor with information about the size and shape of your heart and how well your heart's chambers and valves are working. This procedure takes approximately one hour. There are no restrictions for this procedure.  Follow up in 1 year with Dr. Marlou Porch.  You will receive a letter in the mail 2 months before you are due.  Please call us when you receive this letter to schedule your follow up appointment.

## 2014-07-04 ENCOUNTER — Other Ambulatory Visit (HOSPITAL_COMMUNITY): Payer: Medicare Other

## 2014-07-04 ENCOUNTER — Ambulatory Visit (HOSPITAL_COMMUNITY)
Admission: RE | Admit: 2014-07-04 | Discharge: 2014-07-04 | Disposition: A | Discharge status: Home / Self Care | Payer: Medicare Other | Source: Ambulatory Visit | Attending: Internal Medicine | Admitting: Internal Medicine

## 2014-07-04 DIAGNOSIS — R55 Syncope and collapse: Secondary | ICD-10-CM | POA: Insufficient documentation

## 2014-07-04 DIAGNOSIS — E785 Hyperlipidemia, unspecified: Secondary | ICD-10-CM | POA: Insufficient documentation

## 2014-07-04 DIAGNOSIS — R9431 Abnormal electrocardiogram [ECG] [EKG]: Secondary | ICD-10-CM | POA: Diagnosis not present

## 2014-07-04 DIAGNOSIS — I359 Nonrheumatic aortic valve disorder, unspecified: Secondary | ICD-10-CM

## 2014-07-04 DIAGNOSIS — I10 Essential (primary) hypertension: Secondary | ICD-10-CM | POA: Insufficient documentation

## 2014-07-04 DIAGNOSIS — I253 Aneurysm of heart: Secondary | ICD-10-CM | POA: Diagnosis not present

## 2014-07-04 NOTE — Progress Notes (Signed)
2D Echo Performed 07/04/2014    Marygrace Drought, RCS

## 2014-07-10 ENCOUNTER — Other Ambulatory Visit: Payer: Self-pay

## 2014-07-10 DIAGNOSIS — Z1231 Encounter for screening mammogram for malignant neoplasm of breast: Secondary | ICD-10-CM

## 2014-07-11 ENCOUNTER — Encounter: Payer: Self-pay | Admitting: Cardiology

## 2014-07-24 DIAGNOSIS — K219 Gastro-esophageal reflux disease without esophagitis: Secondary | ICD-10-CM | POA: Diagnosis not present

## 2014-07-24 DIAGNOSIS — Z800 Family history of malignant neoplasm of digestive organs: Secondary | ICD-10-CM | POA: Diagnosis not present

## 2014-08-13 ENCOUNTER — Ambulatory Visit
Admission: RE | Admit: 2014-08-13 | Discharge: 2014-08-13 | Disposition: A | Discharge status: Home / Self Care | Payer: Medicare Other | Source: Ambulatory Visit

## 2014-08-13 DIAGNOSIS — Z1231 Encounter for screening mammogram for malignant neoplasm of breast: Secondary | ICD-10-CM

## 2014-08-14 ENCOUNTER — Other Ambulatory Visit: Payer: Self-pay | Admitting: Gastroenterology

## 2014-08-14 DIAGNOSIS — Z1211 Encounter for screening for malignant neoplasm of colon: Secondary | ICD-10-CM | POA: Diagnosis not present

## 2014-08-14 DIAGNOSIS — D128 Benign neoplasm of rectum: Secondary | ICD-10-CM | POA: Diagnosis not present

## 2014-08-14 DIAGNOSIS — K621 Rectal polyp: Secondary | ICD-10-CM | POA: Diagnosis not present

## 2014-08-14 DIAGNOSIS — K5730 Diverticulosis of large intestine without perforation or abscess without bleeding: Secondary | ICD-10-CM | POA: Diagnosis not present

## 2014-08-14 DIAGNOSIS — Z800 Family history of malignant neoplasm of digestive organs: Secondary | ICD-10-CM | POA: Diagnosis not present

## 2014-08-29 DIAGNOSIS — R001 Bradycardia, unspecified: Secondary | ICD-10-CM | POA: Diagnosis not present

## 2014-08-29 DIAGNOSIS — E119 Type 2 diabetes mellitus without complications: Secondary | ICD-10-CM | POA: Diagnosis not present

## 2014-08-29 DIAGNOSIS — J309 Allergic rhinitis, unspecified: Secondary | ICD-10-CM | POA: Diagnosis not present

## 2014-08-29 DIAGNOSIS — R300 Dysuria: Secondary | ICD-10-CM | POA: Diagnosis not present

## 2014-08-29 DIAGNOSIS — R600 Localized edema: Secondary | ICD-10-CM | POA: Diagnosis not present

## 2014-08-29 DIAGNOSIS — E871 Hypo-osmolality and hyponatremia: Secondary | ICD-10-CM | POA: Diagnosis not present

## 2014-08-29 DIAGNOSIS — I10 Essential (primary) hypertension: Secondary | ICD-10-CM | POA: Diagnosis not present

## 2014-08-29 DIAGNOSIS — Z23 Encounter for immunization: Secondary | ICD-10-CM | POA: Diagnosis not present

## 2014-08-29 DIAGNOSIS — E780 Pure hypercholesterolemia: Secondary | ICD-10-CM | POA: Diagnosis not present

## 2014-08-30 DIAGNOSIS — L821 Other seborrheic keratosis: Secondary | ICD-10-CM | POA: Diagnosis not present

## 2014-08-30 DIAGNOSIS — B078 Other viral warts: Secondary | ICD-10-CM | POA: Diagnosis not present

## 2014-08-30 DIAGNOSIS — D225 Melanocytic nevi of trunk: Secondary | ICD-10-CM | POA: Diagnosis not present

## 2014-08-30 DIAGNOSIS — L814 Other melanin hyperpigmentation: Secondary | ICD-10-CM | POA: Diagnosis not present

## 2014-10-07 DIAGNOSIS — I10 Essential (primary) hypertension: Secondary | ICD-10-CM | POA: Diagnosis not present

## 2014-10-07 DIAGNOSIS — J0110 Acute frontal sinusitis, unspecified: Secondary | ICD-10-CM | POA: Diagnosis not present

## 2015-02-28 DIAGNOSIS — E119 Type 2 diabetes mellitus without complications: Secondary | ICD-10-CM | POA: Diagnosis not present

## 2015-02-28 DIAGNOSIS — I10 Essential (primary) hypertension: Secondary | ICD-10-CM | POA: Diagnosis not present

## 2015-02-28 DIAGNOSIS — E039 Hypothyroidism, unspecified: Secondary | ICD-10-CM | POA: Diagnosis not present

## 2015-02-28 DIAGNOSIS — E780 Pure hypercholesterolemia: Secondary | ICD-10-CM | POA: Diagnosis not present

## 2015-02-28 DIAGNOSIS — Z1159 Encounter for screening for other viral diseases: Secondary | ICD-10-CM | POA: Diagnosis not present

## 2015-02-28 DIAGNOSIS — M545 Low back pain: Secondary | ICD-10-CM | POA: Diagnosis not present

## 2015-04-14 DIAGNOSIS — J019 Acute sinusitis, unspecified: Secondary | ICD-10-CM | POA: Diagnosis not present

## 2015-04-25 DIAGNOSIS — R001 Bradycardia, unspecified: Secondary | ICD-10-CM | POA: Diagnosis not present

## 2015-04-25 DIAGNOSIS — R1013 Epigastric pain: Secondary | ICD-10-CM | POA: Diagnosis not present

## 2015-04-30 ENCOUNTER — Encounter: Payer: Self-pay | Admitting: Internal Medicine

## 2015-05-07 DIAGNOSIS — R109 Unspecified abdominal pain: Secondary | ICD-10-CM | POA: Diagnosis not present

## 2015-06-03 ENCOUNTER — Ambulatory Visit (INDEPENDENT_AMBULATORY_CARE_PROVIDER_SITE_OTHER): Payer: Medicare Other | Admitting: Physician Assistant

## 2015-06-03 ENCOUNTER — Encounter: Payer: Self-pay | Admitting: Physician Assistant

## 2015-06-03 VITALS — BP 164/82 | HR 62 | Ht 65.0 in | Wt 191.0 lb

## 2015-06-03 DIAGNOSIS — I10 Essential (primary) hypertension: Secondary | ICD-10-CM

## 2015-06-03 DIAGNOSIS — R001 Bradycardia, unspecified: Secondary | ICD-10-CM | POA: Diagnosis not present

## 2015-06-03 DIAGNOSIS — R0789 Other chest pain: Secondary | ICD-10-CM | POA: Diagnosis not present

## 2015-06-03 NOTE — Assessment & Plan Note (Signed)
Patient's bradycardia early in the morning just after waking.  She is asymptomatic.  EKG today shows a rate of 62 bpm QT interval is just mildly prolonged. Previous EKG her heart rate was 52. I don't think a morning heart rate of 38 is that far off and given she is asymptomatic and don't think we need to do anything. I did offer a heart monitor for couple days. She needs to increase her amlodipine back to 10mg  for better blood pressure control I think that's okay

## 2015-06-03 NOTE — Assessment & Plan Note (Signed)
Patient reports blood pressures typically within the normal range. She will continue to monitor.

## 2015-06-03 NOTE — Progress Notes (Signed)
Patient ID: [name], female   DOB: 08/12/1946, 69 y.o.   MRN: [id number]    Date:  06/03/2015   ID:  [name], DOB 05-27-1946, MRN [id number]  PCP:  Shirline Frees, MD  Primary Cardiologist:  [facility]   Chief Complaint  Patient presents with  . Advice,  radical     patient experienced some chest and upper back discomfort. she feels this pain when she has stomach discomfort. she will also get pain running down her left arm.  also has lower extrem edema. denies having any nausea or vomitting.     History of Present Illness: [name] is a 69 y.o. female with hyperlipidemia, hypertension, prior syncope due to elevated Azor dose, prior hyponatremia on a mild diuretic cardiac catheterization with no angiographically significant coronary artery disease 3/12.   CPK 290-300's. Stable. Now taking once a day. No prior MI. +DM, no prior CVA. No early CAD. +HTN. Crestor because of DM.  Had an issue with stomach pain. PPI seems to be helping.   Patient presents today because she was concerned that her heart rate was dropping too low. She checked the other morning and it was 38. She was asymptomatic but she reports that her heart rate quickly in the morning is typically 40s and 50s. Her EKG last year that was 51.  She has had problems with low sodium and when it was checked most recently was 134  The patient currently denies nausea, vomiting, fever, chest pain, shortness of breath, orthopnea, dizziness, PND, cough, congestion, hematochezia, melena, claudication.  Wt Readings from Last 3 Encounters:  06/03/15 191 lb (86.637 kg)  07/02/14 190 lb (86.183 kg)  01/07/14 191 lb (86.637 kg)     Past Medical History  Diagnosis Date  . Syncope   . Hyperlipidemia   . HTN (hypertension)   . Diabetes   . Hypothyroidism   . Esophageal reflux   . Seasonal allergies   . Fibrocystic breast   . OA (osteoarthritis) of hip   . Hemorrhoids   . Colon polyp   . Diverticulosis   . Basal cell  carcinoma   . Acute meniscal tear of knee     Current Outpatient Prescriptions  Medication Sig Dispense Refill  . amLODipine (NORVASC) 5 MG tablet Take 5 mg by mouth daily.    . Ascorbic Acid (VITAMIN C) 100 MG tablet Take 100 mg by mouth daily.    Marland Kitchen aspirin 81 MG tablet Take 81 mg by mouth daily.    . Calcium Carbonate-Vitamin D (CALCIUM-D) 600-400 MG-UNIT TABS Take 1 tablet by mouth daily.    . fluticasone (FLONASE) 50 MCG/ACT nasal spray Place 2 sprays into both nostrils as needed for allergies or rhinitis.    Marland Kitchen levothyroxine (SYNTHROID, LEVOTHROID) 50 MCG tablet Take 50 mcg by mouth daily before breakfast.    . losartan (COZAAR) 50 MG tablet Take 1.5 tablets (75 mg total) by mouth daily.    Marland Kitchen omeprazole (PRILOSEC) 40 MG capsule Take 40 mg by mouth daily.    . pantoprazole (PROTONIX) 40 MG tablet Take 40 mg by mouth daily.  5  . ranitidine (ZANTAC) 300 MG tablet Take 300 mg by mouth at bedtime.    . rosuvastatin (CRESTOR) 5 MG tablet Take 5 mg by mouth daily.    . vitamin E 100 UNIT capsule Take by mouth daily.     No current facility-administered medications for this visit.    Allergies:    Allergies  Allergen  Reactions  . Amoxicillin     REACTION: Rash  . Clarithromycin     REACTION: Nausea and vomiting  . Hydrochlorothiazide     REACTION: Decreased Potassium and decreased Sodium  . Lipitor [Atorvastatin] Other (See Comments)    CPK  . Niaspan [Niacin Er] Other (See Comments)    CPK  . Triglide [Fenofibrate] Other (See Comments)    CPK  . Welchol [Colesevelam Hcl] Other (See Comments)    CPK  . Zetia [Ezetimibe] Other (See Comments)    CPK   . Zostavax [Zoster Vaccine Live]     Social History:  The patient  reports that she has never smoked. She does not have any smokeless tobacco history on file. She reports that she drinks alcohol. She reports that she does not use illicit drugs.   Family history:   Family History  Problem Relation Age of Onset  . Colon cancer  Mother   . Hypertension Mother   . Heart disease Mother   . Coronary artery disease Mother   . Pancreatic cancer Father   . Other Brother     CABG    ROS:  Please see the history of present illness.  All other systems reviewed and negative.   PHYSICAL EXAM: VS:  BP 164/82 mmHg  Pulse 62  Ht 5\' 5"  (1.651 m)  Wt 191 lb (86.637 kg)  BMI 31.78 kg/m2 Well nourished, well developed, in no acute distress HEENT: Pupils are equal round react to light accommodation extraocular movements are intact.  Neck: no JVDNo cervical lymphadenopathy. Cardiac: Regular rate and rhythm 1/6 systolic murmurb. Lungs:  clear to auscultation bilaterally, no wheezing, rhonchi or rales Abd: soft, nontender, positive bowel sounds all quadrants, no hepatosplenomegaly Ext: Trace lower extremity edema.  2+ radial and dorsalis pedis pulses. Skin: warm and dry Neuro:  Grossly normal  EKG:    Sinus rhythm rate 62 bpm QT interval 475. T-wave inversions are old.  ASSESSMENT AND PLAN:  Problem List Items Addressed This Visit    Essential hypertension, benign    Patient reports blood pressures typically within the normal range. She will continue to monitor.      Relevant Orders   EKG 12-Lead   Bradycardia    Patient's bradycardia early in the morning just after waking.  She is asymptomatic.  EKG today shows a rate of 62 bpm QT interval is just mildly prolonged. Previous EKG her heart rate was 52. I don't think a morning heart rate of 38 is that far off and given she is asymptomatic and don't think we need to do anything. I did offer a heart monitor for couple days. She needs to increase her amlodipine back to 10mg  for better blood pressure control I think that's okay      Atypical chest pain - Primary   Relevant Orders   EKG 12-Lead      1. Abnormal ECG - with her history of HTN, deep T wave inversion, will check ECG to make sure no evidence of hypertrophic cardiomyopathy.  2. Hypertension-currently  elevated. Was elevated at last doctor's office visit as well. Will increase losartan to 75mg . She is seeing Dr. Kenton Kingfisher in one month. If this is working well, he can continue with 75 mg prescribed. If still elevated, increase to 100 mg. Prior syncope with Azor. 3. Prior atypical chest pain-previously has been helped with proton pump inhibitor. Reassuring cardiac catheterization in 2012. We discussed once again. 4. Obesity-struggles with weight loss. Trying to decrease carbohydrates. Challenging  for her. Her weight is not changing. 5. Hyperlipidemia - low dose statin.  6. Elevated CPK-Dr. Kenton Kingfisher has been monitoring closely. Continuing with low-dose Crestor. Once yearly check seems reasonable.  7. One year follow up.

## 2015-06-03 NOTE — Patient Instructions (Signed)
Your physician recommends that you schedule a follow-up appointment in: 1 month with Dr. Marlou Porch. Continue all medications as prescribed.

## 2015-07-02 ENCOUNTER — Ambulatory Visit: Payer: Medicare Other | Admitting: Cardiology

## 2015-07-08 DIAGNOSIS — E119 Type 2 diabetes mellitus without complications: Secondary | ICD-10-CM | POA: Diagnosis not present

## 2015-07-10 ENCOUNTER — Other Ambulatory Visit: Payer: Self-pay

## 2015-07-10 DIAGNOSIS — Z1231 Encounter for screening mammogram for malignant neoplasm of breast: Secondary | ICD-10-CM

## 2015-07-23 ENCOUNTER — Encounter: Payer: Self-pay | Admitting: Cardiology

## 2015-07-23 ENCOUNTER — Ambulatory Visit (INDEPENDENT_AMBULATORY_CARE_PROVIDER_SITE_OTHER): Payer: Medicare Other | Admitting: Cardiology

## 2015-07-23 VITALS — BP 136/84 | HR 61 | Ht 65.0 in | Wt 194.8 lb

## 2015-07-23 DIAGNOSIS — R748 Abnormal levels of other serum enzymes: Secondary | ICD-10-CM

## 2015-07-23 DIAGNOSIS — R9431 Abnormal electrocardiogram [ECG] [EKG]: Secondary | ICD-10-CM | POA: Diagnosis not present

## 2015-07-23 DIAGNOSIS — R001 Bradycardia, unspecified: Secondary | ICD-10-CM | POA: Diagnosis not present

## 2015-07-23 DIAGNOSIS — I10 Essential (primary) hypertension: Secondary | ICD-10-CM | POA: Diagnosis not present

## 2015-07-23 MED ORDER — LOSARTAN POTASSIUM 50 MG PO TABS: 50.0000 mg | ORAL_TABLET | Freq: Every day | ORAL | Status: DC

## 2015-07-23 NOTE — Progress Notes (Signed)
Patient ID: [name], female   DOB: 12/15/1945, 69 y.o.   MRN: [id number]    Date:  07/23/2015   ID:  [name], DOB [birth year], MRN [id number]  PCP:  Molly Frees, MD  Primary Cardiologist:  [name]     History of Present Illness: [name] is a 69 y.o. female with hyperlipidemia, hypertension, prior syncope due to elevated Azor dose, prior hyponatremia on a mild diuretic cardiac catheterization with no angiographically significant coronary artery disease 3/12, abnormal EKG with T-wave inversions.   CPK 290-300's. Stable. Now taking once a day. No prior MI. +DM, no prior CVA. No early CAD. +HTN. Crestor because of DM.  Had an issue with stomach pain. PPI seems to be helping.   Patient presents today for follow-up heart rate was dropping too low. Recently saw [name]. She checked the other morning and it was 38. She was asymptomatic but she reports that her heart rate quickly in the morning is typically 40s and 50s. Question PVCs Her EKG last year that was 51.  She has had problems with low sodium and when it was checked most recently was 134. Currently she is doing much better. Her heart rate is improved after decreasing the amlodipine to 5 mg.   The patient currently denies nausea, vomiting, fever, chest pain, shortness of breath, orthopnea, dizziness, PND, cough, congestion, hematochezia, melena, claudication.  Wt Readings from Last 3 Encounters:  07/23/15 194 lb 12.8 oz (88.361 kg)  06/03/15 191 lb (86.637 kg)  07/02/14 190 lb (86.183 kg)     Past Medical History  Diagnosis Date  . Syncope   . Hyperlipidemia   . HTN (hypertension)   . Diabetes   . Hypothyroidism   . Esophageal reflux   . Seasonal allergies   . Fibrocystic breast   . OA (osteoarthritis) of hip   . Hemorrhoids   . Colon polyp   . Diverticulosis   . Basal cell carcinoma   . Acute meniscal tear of knee     Current Outpatient Prescriptions  Medication Sig Dispense Refill  .  amLODipine (NORVASC) 5 MG tablet Take 5 mg by mouth daily.    . Ascorbic Acid (VITAMIN C) 100 MG tablet Take 100 mg by mouth daily.    Marland Kitchen aspirin 81 MG tablet Take 81 mg by mouth daily.    . Calcium Carbonate-Vitamin D (CALCIUM-D) 600-400 MG-UNIT TABS Take 1 tablet by mouth daily.    . fluticasone (FLONASE) 50 MCG/ACT nasal spray Place 2 sprays into both nostrils as needed for allergies or rhinitis.    Marland Kitchen levothyroxine (SYNTHROID, LEVOTHROID) 50 MCG tablet Take 50 mcg by mouth daily before breakfast.    . losartan (COZAAR) 50 MG tablet Take 1.5 tablets (75 mg total) by mouth daily.    Marland Kitchen omeprazole (PRILOSEC) 20 MG capsule Take 20 mg by mouth daily.    . pantoprazole (PROTONIX) 40 MG tablet Take 40 mg by mouth daily.  5  . ranitidine (ZANTAC) 300 MG tablet Take 300 mg by mouth at bedtime.    . rosuvastatin (CRESTOR) 5 MG tablet Take 5 mg by mouth daily.    . vitamin E 100 UNIT capsule Take by mouth daily.     No current facility-administered medications for this visit.    Allergies:    Allergies  Allergen Reactions  . Amoxicillin     REACTION: Rash  . Clarithromycin     REACTION: Nausea and vomiting  . Hydrochlorothiazide  REACTION: Decreased Potassium and decreased Sodium  . Lipitor [Atorvastatin] Other (See Comments)    CPK  . Niaspan [Niacin Er] Other (See Comments)    CPK  . Triglide [Fenofibrate] Other (See Comments)    CPK  . Welchol [Colesevelam Hcl] Other (See Comments)    CPK  . Zetia [Ezetimibe] Other (See Comments)    CPK   . Zostavax [Zoster Vaccine Live]     Social History:  The patient  reports that she has never smoked. She does not have any smokeless tobacco history on file. She reports that she drinks alcohol. She reports that she does not use illicit drugs.   Family history:   Family History  Problem Relation Age of Onset  . Colon cancer Mother   . Hypertension Mother   . Heart disease Mother   . Coronary artery disease Mother   . Pancreatic cancer  Father   . Other Brother     CABG    ROS:  Please see the history of present illness.  All other systems reviewed and negative.   PHYSICAL EXAM: VS:  BP 136/84 mmHg  Pulse 61  Ht 5\' 5"  (1.651 m)  Wt 194 lb 12.8 oz (88.361 kg)  BMI 32.42 kg/m2  SpO2 98% Well nourished, well developed, in no acute distress HEENT: Pupils are equal round react to light accommodation extraocular movements are intact.  Neck: no JVDNo cervical lymphadenopathy. Cardiac: Regular rate and rhythm 1/6 systolic murmurb. Lungs:  clear to auscultation bilaterally, no wheezing, rhonchi or rales Abd: soft, nontender, positive bowel sounds all quadrants, no hepatosplenomegaly Ext: Trace lower extremity edema.  2+ radial and dorsalis pedis pulses. Skin: warm and dry Neuro:  Grossly normal  EKG:    06/03/15-personally viewed-Sinus rhythm rate 62 bpm QT interval 475. T-wave inversions are old.  ASSESSMENT AND PLAN:  Problem List Items Addressed This Visit    None      1. Abnormal ECG - with her history of HTN, deep T wave inversion. Prior heart catheterization reassuring. Is quite possible that her prior bradycardia picked up on heart monitor was secondary to frequent PVCs, non-conducted beats. After [name] reduced her amlodipine this was much improved.   2. Hypertension-well-controlled.  She has actually been taking losartan 50 mg, prior reduction in amlodipine to 5 mg. 3. Prior atypical chest pain-previously has been helped with proton pump inhibitor. Reassuring cardiac catheterization in 2012. We discussed once again. 4. Obesity-struggles with weight loss. Trying to decrease carbohydrates. Challenging for her. Her weight is not changing. 5. Hyperlipidemia - low dose statin.  6. Elevated CPK-[name] has been monitoring closely. Continuing with low-dose Crestor. Once yearly check seems reasonable.  7. One year follow up.

## 2015-07-23 NOTE — Patient Instructions (Signed)
Medication Instructions:  Your physician recommends that you continue on your current medications as directed. Please refer to the Current Medication list given to you today.  Labwork: NONE  Testing/Procedures: NONE  Follow-Up: Your physician wants you to follow-up in: 1 year follow-up.  You will receive a reminder letter in the mail two months in advance. If you don't receive a letter, please call our office to schedule the follow-up appointment.

## 2015-07-29 DIAGNOSIS — Z23 Encounter for immunization: Secondary | ICD-10-CM | POA: Diagnosis not present

## 2015-08-19 ENCOUNTER — Ambulatory Visit
Admission: RE | Admit: 2015-08-19 | Discharge: 2015-08-19 | Disposition: A | Discharge status: Home / Self Care | Payer: Medicare Other | Source: Ambulatory Visit

## 2015-08-19 DIAGNOSIS — Z1231 Encounter for screening mammogram for malignant neoplasm of breast: Secondary | ICD-10-CM

## 2015-08-29 DIAGNOSIS — Z85828 Personal history of other malignant neoplasm of skin: Secondary | ICD-10-CM | POA: Diagnosis not present

## 2015-08-29 DIAGNOSIS — Z08 Encounter for follow-up examination after completed treatment for malignant neoplasm: Secondary | ICD-10-CM | POA: Diagnosis not present

## 2015-08-29 DIAGNOSIS — D225 Melanocytic nevi of trunk: Secondary | ICD-10-CM | POA: Diagnosis not present

## 2015-08-29 DIAGNOSIS — L821 Other seborrheic keratosis: Secondary | ICD-10-CM | POA: Diagnosis not present

## 2015-09-01 DIAGNOSIS — E039 Hypothyroidism, unspecified: Secondary | ICD-10-CM | POA: Diagnosis not present

## 2015-09-01 DIAGNOSIS — I10 Essential (primary) hypertension: Secondary | ICD-10-CM | POA: Diagnosis not present

## 2015-09-01 DIAGNOSIS — J301 Allergic rhinitis due to pollen: Secondary | ICD-10-CM | POA: Diagnosis not present

## 2015-09-01 DIAGNOSIS — E786 Lipoprotein deficiency: Secondary | ICD-10-CM | POA: Diagnosis not present

## 2015-09-01 DIAGNOSIS — E119 Type 2 diabetes mellitus without complications: Secondary | ICD-10-CM | POA: Diagnosis not present

## 2015-09-01 DIAGNOSIS — K219 Gastro-esophageal reflux disease without esophagitis: Secondary | ICD-10-CM | POA: Diagnosis not present

## 2015-09-01 DIAGNOSIS — E2839 Other primary ovarian failure: Secondary | ICD-10-CM | POA: Diagnosis not present

## 2015-09-30 DIAGNOSIS — J0110 Acute frontal sinusitis, unspecified: Secondary | ICD-10-CM | POA: Diagnosis not present

## 2015-10-09 DIAGNOSIS — Z780 Asymptomatic menopausal state: Secondary | ICD-10-CM | POA: Diagnosis not present

## 2016-01-01 DIAGNOSIS — K5900 Constipation, unspecified: Secondary | ICD-10-CM | POA: Diagnosis not present

## 2016-01-01 DIAGNOSIS — R109 Unspecified abdominal pain: Secondary | ICD-10-CM | POA: Diagnosis not present

## 2016-01-01 DIAGNOSIS — R102 Pelvic and perineal pain: Secondary | ICD-10-CM | POA: Diagnosis not present

## 2016-01-06 DIAGNOSIS — R319 Hematuria, unspecified: Secondary | ICD-10-CM | POA: Diagnosis not present

## 2016-03-11 DIAGNOSIS — E871 Hypo-osmolality and hyponatremia: Secondary | ICD-10-CM | POA: Diagnosis not present

## 2016-03-11 DIAGNOSIS — K219 Gastro-esophageal reflux disease without esophagitis: Secondary | ICD-10-CM | POA: Diagnosis not present

## 2016-03-11 DIAGNOSIS — R748 Abnormal levels of other serum enzymes: Secondary | ICD-10-CM | POA: Diagnosis not present

## 2016-03-11 DIAGNOSIS — E785 Hyperlipidemia, unspecified: Secondary | ICD-10-CM | POA: Diagnosis not present

## 2016-03-11 DIAGNOSIS — E119 Type 2 diabetes mellitus without complications: Secondary | ICD-10-CM | POA: Diagnosis not present

## 2016-03-11 DIAGNOSIS — E039 Hypothyroidism, unspecified: Secondary | ICD-10-CM | POA: Diagnosis not present

## 2016-03-11 DIAGNOSIS — I10 Essential (primary) hypertension: Secondary | ICD-10-CM | POA: Diagnosis not present

## 2016-07-08 DIAGNOSIS — E119 Type 2 diabetes mellitus without complications: Secondary | ICD-10-CM | POA: Diagnosis not present

## 2016-07-08 DIAGNOSIS — Z23 Encounter for immunization: Secondary | ICD-10-CM | POA: Diagnosis not present

## 2016-07-19 ENCOUNTER — Other Ambulatory Visit: Payer: Self-pay | Admitting: Family Medicine

## 2016-07-19 DIAGNOSIS — Z1231 Encounter for screening mammogram for malignant neoplasm of breast: Secondary | ICD-10-CM

## 2016-07-26 ENCOUNTER — Ambulatory Visit (INDEPENDENT_AMBULATORY_CARE_PROVIDER_SITE_OTHER): Payer: Medicare Other | Admitting: Cardiology

## 2016-07-26 ENCOUNTER — Encounter: Payer: Self-pay | Admitting: Cardiology

## 2016-07-26 VITALS — BP 132/78 | HR 54 | Ht 65.0 in | Wt 188.4 lb

## 2016-07-26 DIAGNOSIS — R9431 Abnormal electrocardiogram [ECG] [EKG]: Secondary | ICD-10-CM

## 2016-07-26 DIAGNOSIS — R001 Bradycardia, unspecified: Secondary | ICD-10-CM | POA: Diagnosis not present

## 2016-07-26 DIAGNOSIS — I10 Essential (primary) hypertension: Secondary | ICD-10-CM

## 2016-07-26 NOTE — Patient Instructions (Signed)

## 2016-07-26 NOTE — Progress Notes (Signed)
Patient ID: [name], female   DOB: Dec 15, 1945, 70 y.o.   MRN: [id number]    Date:  07/26/2016   ID:  [name], DOB 1946-08-07, MRN [id number]  PCP:  Shirline Frees, MD  Primary Cardiologist:  Marlou Porch     History of Present Illness: [name] is a 70 y.o. female with hyperlipidemia, hypertension, prior syncope due to elevated Azor dose, prior hyponatremia on a mild diuretic cardiac catheterization with no angiographically significant coronary artery disease 3/12, abnormal EKG with T-wave inversions.   CPK 290-300's. Stable. Now taking once a day. No prior MI. +DM, no prior CVA. No early CAD. +HTN. Crestor because of DM.  Had an issue with stomach pain. PPI seems to be helping.   Chronic pedal edema.  Has had her blood pressure cuff show heart rates in the past in the upper 30s, likely secondary to frequent PVCs. This was improved after Dr. Kenton Kingfisher reduced the dose of amlodipine.  The patient currently denies nausea, vomiting, fever, chest pain, shortness of breath, orthopnea, dizziness, PND, cough, congestion, hematochezia, melena, claudication.  Wt Readings from Last 3 Encounters:  07/26/16 188 lb 6.4 oz (85.5 kg)  07/23/15 194 lb 12.8 oz (88.4 kg)  06/03/15 191 lb (86.6 kg)     Past Medical History:  Diagnosis Date  . Acute meniscal tear of knee   . Basal cell carcinoma   . Colon polyp   . Diabetes ([place])   . Diverticulosis   . Esophageal reflux   . Fibrocystic breast   . Hemorrhoids   . HTN (hypertension)   . Hyperlipidemia   . Hypothyroidism   . OA (osteoarthritis) of hip   . Seasonal allergies   . Syncope     Current Outpatient Prescriptions  Medication Sig Dispense Refill  . amLODipine (NORVASC) 5 MG tablet Take 5 mg by mouth daily.    . Ascorbic Acid (VITAMIN C) 100 MG tablet Take 100 mg by mouth daily.    Marland Kitchen aspirin 81 MG tablet Take 81 mg by mouth daily.    . Calcium Carbonate-Vitamin D (CALCIUM-D) 600-400 MG-UNIT TABS Take 1 tablet by mouth  daily.    . fluticasone (FLONASE) 50 MCG/ACT nasal spray Place 2 sprays into both nostrils as needed for allergies or rhinitis.    Marland Kitchen levothyroxine (SYNTHROID, LEVOTHROID) 50 MCG tablet Take 50 mcg by mouth daily before breakfast.    . losartan (COZAAR) 50 MG tablet Take 1 tablet (50 mg total) by mouth daily.    Marland Kitchen omeprazole (PRILOSEC) 40 MG capsule Take 40 mg by mouth daily.     . ranitidine (ZANTAC) 300 MG tablet Take 300 mg by mouth at bedtime.    . rosuvastatin (CRESTOR) 5 MG tablet Take 5 mg by mouth daily.    . vitamin E 100 UNIT capsule Take by mouth daily.     No current facility-administered medications for this visit.     Allergies:    Allergies  Allergen Reactions  . Amoxicillin     REACTION: Rash  . Clarithromycin     REACTION: Nausea and vomiting  . Hydrochlorothiazide     REACTION: Decreased Potassium and decreased Sodium  . Lipitor [Atorvastatin] Other (See Comments)    CPK  . Niaspan [Niacin Er] Other (See Comments)    CPK  . Triglide [Fenofibrate] Other (See Comments)    CPK  . Welchol [Colesevelam Hcl] Other (See Comments)    CPK  . Zetia [Ezetimibe] Other (See Comments)  CPK   . Zostavax [Zoster Vaccine Live]     Social History:  The patient  reports that she has never smoked. She does not have any smokeless tobacco history on file. She reports that she drinks alcohol. She reports that she does not use drugs.   Family history:   Family History  Problem Relation Age of Onset  . Colon cancer Mother   . Hypertension Mother   . Heart disease Mother   . Coronary artery disease Mother   . Pancreatic cancer Father   . Other Brother     CABG    ROS:  Please see the history of present illness.  All other systems reviewed and negative.   PHYSICAL EXAM: VS:  BP 132/78   Pulse (!) 54   Ht 5\' 5"  (1.651 m)   Wt 188 lb 6.4 oz (85.5 kg)   BMI 31.35 kg/m  Well nourished, well developed, in no acute distress  HEENT: Pupils are equal round react to light  accommodation extraocular movements are intact.  Neck: no JVD No cervical lymphadenopathy. Cardiac: Regular rate and rhythm 1/6 systolic murmur. Rare ectopy. Lungs:  clear to auscultation bilaterally, no wheezing, rhonchi or rales  Abd: soft, nontender, positive bowel sounds all quadrants, no hepatosplenomegaly  Ext: Trace lower extremity edema.  2+ radial and dorsalis pedis pulses. Skin: warm and dry  Neuro:  Grossly normal  EKG:    Ordered today - 06/25/16 - SB 51 TWI V3-5. 06/03/15-personally viewed-Sinus rhythm rate 62 bpm QT interval 475. T-wave inversions are old.  ASSESSMENT AND PLAN:  Problem List Items Addressed This Visit    Essential hypertension, benign   Relevant Orders   EKG 12-Lead   Abnormal EKG   Relevant Orders   EKG 12-Lead   Bradycardia - Primary   Relevant Orders   EKG 12-Lead    Other Visit Diagnoses   None.     1. Abnormal ECG - with her history of HTN, deep T wave inversion. Prior heart catheterization reassuring. Is quite possible that her prior bradycardia picked up on heart monitor was secondary to frequent PVCs, non-conducted beats. After Dr. Kenton Kingfisher reduced her amlodipine this was much improved. Currently doing well. No anginal symptoms. 2. Hypertension-well-controlled.  She has actually been taking losartan 50 mg, prior reduction in amlodipine to 5 mg. seems to be doing quite well. 3. Prior atypical chest pain-previously has been helped with proton pump inhibitor. Reassuring cardiac catheterization in 2012. We discussed once again. 4. Obesity-struggles with weight loss. Trying to decrease carbohydrates. Challenging for her. She hasn't at which helps her walk 6000 steps a day, 35 minutes of activity. Continue. 5. Hyperlipidemia - low dose statin. Crestor 5. 6. Elevated CPK-Dr. Kenton Kingfisher has been monitoring closely. Continuing with low-dose Crestor. Once yearly check seems reasonable.  7. One year follow up.

## 2016-08-19 ENCOUNTER — Ambulatory Visit: Payer: Self-pay

## 2016-08-24 ENCOUNTER — Ambulatory Visit
Admission: RE | Admit: 2016-08-24 | Discharge: 2016-08-24 | Disposition: A | Discharge status: Home / Self Care | Payer: Medicare Other | Source: Ambulatory Visit | Attending: Family Medicine | Admitting: Family Medicine

## 2016-08-24 DIAGNOSIS — Z1231 Encounter for screening mammogram for malignant neoplasm of breast: Secondary | ICD-10-CM

## 2016-08-24 DIAGNOSIS — R350 Frequency of micturition: Secondary | ICD-10-CM | POA: Diagnosis not present

## 2016-08-27 DIAGNOSIS — L821 Other seborrheic keratosis: Secondary | ICD-10-CM | POA: Diagnosis not present

## 2016-08-27 DIAGNOSIS — D1801 Hemangioma of skin and subcutaneous tissue: Secondary | ICD-10-CM | POA: Diagnosis not present

## 2016-08-27 DIAGNOSIS — L814 Other melanin hyperpigmentation: Secondary | ICD-10-CM | POA: Diagnosis not present

## 2016-08-27 DIAGNOSIS — D235 Other benign neoplasm of skin of trunk: Secondary | ICD-10-CM | POA: Diagnosis not present

## 2016-08-27 DIAGNOSIS — Z85828 Personal history of other malignant neoplasm of skin: Secondary | ICD-10-CM | POA: Diagnosis not present

## 2016-09-17 DIAGNOSIS — K219 Gastro-esophageal reflux disease without esophagitis: Secondary | ICD-10-CM | POA: Diagnosis not present

## 2016-09-17 DIAGNOSIS — I10 Essential (primary) hypertension: Secondary | ICD-10-CM | POA: Diagnosis not present

## 2016-09-17 DIAGNOSIS — Z0000 Encounter for general adult medical examination without abnormal findings: Secondary | ICD-10-CM | POA: Diagnosis not present

## 2016-09-17 DIAGNOSIS — E7800 Pure hypercholesterolemia, unspecified: Secondary | ICD-10-CM | POA: Diagnosis not present

## 2016-09-17 DIAGNOSIS — E039 Hypothyroidism, unspecified: Secondary | ICD-10-CM | POA: Diagnosis not present

## 2016-09-17 DIAGNOSIS — E119 Type 2 diabetes mellitus without complications: Secondary | ICD-10-CM | POA: Diagnosis not present

## 2016-12-14 DIAGNOSIS — I10 Essential (primary) hypertension: Secondary | ICD-10-CM | POA: Diagnosis not present

## 2016-12-14 DIAGNOSIS — J0110 Acute frontal sinusitis, unspecified: Secondary | ICD-10-CM | POA: Diagnosis not present

## 2017-03-17 DIAGNOSIS — E039 Hypothyroidism, unspecified: Secondary | ICD-10-CM | POA: Diagnosis not present

## 2017-03-17 DIAGNOSIS — J301 Allergic rhinitis due to pollen: Secondary | ICD-10-CM | POA: Diagnosis not present

## 2017-03-17 DIAGNOSIS — R600 Localized edema: Secondary | ICD-10-CM | POA: Diagnosis not present

## 2017-03-17 DIAGNOSIS — E7800 Pure hypercholesterolemia, unspecified: Secondary | ICD-10-CM | POA: Diagnosis not present

## 2017-03-17 DIAGNOSIS — J0101 Acute recurrent maxillary sinusitis: Secondary | ICD-10-CM | POA: Diagnosis not present

## 2017-03-17 DIAGNOSIS — K219 Gastro-esophageal reflux disease without esophagitis: Secondary | ICD-10-CM | POA: Diagnosis not present

## 2017-03-17 DIAGNOSIS — E119 Type 2 diabetes mellitus without complications: Secondary | ICD-10-CM | POA: Diagnosis not present

## 2017-03-17 DIAGNOSIS — I10 Essential (primary) hypertension: Secondary | ICD-10-CM | POA: Diagnosis not present

## 2017-05-23 ENCOUNTER — Telehealth: Payer: Self-pay

## 2017-05-23 DIAGNOSIS — M25512 Pain in left shoulder: Secondary | ICD-10-CM | POA: Diagnosis not present

## 2017-05-23 DIAGNOSIS — Z1389 Encounter for screening for other disorder: Secondary | ICD-10-CM | POA: Diagnosis not present

## 2017-05-23 DIAGNOSIS — R079 Chest pain, unspecified: Secondary | ICD-10-CM | POA: Diagnosis not present

## 2017-05-23 DIAGNOSIS — K219 Gastro-esophageal reflux disease without esophagitis: Secondary | ICD-10-CM | POA: Diagnosis not present

## 2017-05-23 DIAGNOSIS — I10 Essential (primary) hypertension: Secondary | ICD-10-CM | POA: Diagnosis not present

## 2017-05-23 NOTE — Telephone Encounter (Signed)
SENT TO SCHEDULING

## 2017-05-23 NOTE — Telephone Encounter (Signed)
Sent to scheduling

## 2017-05-25 ENCOUNTER — Ambulatory Visit (INDEPENDENT_AMBULATORY_CARE_PROVIDER_SITE_OTHER): Payer: Medicare Other | Admitting: Cardiology

## 2017-05-25 ENCOUNTER — Encounter: Payer: Self-pay | Admitting: Cardiology

## 2017-05-25 VITALS — BP 152/88 | HR 68 | Ht 65.0 in | Wt 183.8 lb

## 2017-05-25 DIAGNOSIS — I10 Essential (primary) hypertension: Secondary | ICD-10-CM

## 2017-05-25 DIAGNOSIS — R0789 Other chest pain: Secondary | ICD-10-CM

## 2017-05-25 DIAGNOSIS — R748 Abnormal levels of other serum enzymes: Secondary | ICD-10-CM | POA: Diagnosis not present

## 2017-05-25 NOTE — Patient Instructions (Signed)
Medication Instructions:  Your physician recommends that you continue on your current medications as directed. Please refer to the Current Medication list given to you today.   Labwork: None ordered  Testing/Procedures: Your physician has requested that you have an echocardiogram. Echocardiography is a painless test that uses sound waves to create images of your heart. It provides your doctor with information about the size and shape of your heart and how well your heart's chambers and valves are working. This procedure takes approximately one hour. There are no restrictions for this procedure.   Follow-Up: Your physician wants you to follow-up in: 6 months with Dr. Marlou Porch. You will receive a reminder letter in the mail two months in advance. If you don't receive a letter, please call our office to schedule the follow-up appointment.   Any Other Special Instructions Will Be Listed Below (If Applicable).     If you need a refill on your cardiac medications before your next appointment, please call your pharmacy.

## 2017-05-25 NOTE — Progress Notes (Signed)
Patient ID: [name], female   DOB: 1946-05-18, 71 y.o.   MRN: [id number]    Date:  05/25/2017   ID:  [name], DOB 10/03/1946, MRN [id number]  PCP:  Shirline Frees, MD  Primary Cardiologist:  Marlou Porch     History of Present Illness: [name] is a 71 y.o. female with hyperlipidemia, hypertension, prior syncope due to elevated Azor dose, prior hyponatremia on a mild diuretic cardiac catheterization with no angiographically significant coronary artery disease 3/12, abnormal EKG with T-wave inversions here for evaluation of chest pain at the request of Dr. Moreen Fowler.   EKG on 05/23/17 personally reviewed showed heart rate of 48 bpm sinus rhythm with T-wave inversion noted in V3 through V5. This is unchanged from her prior EKG.  BP was mildly elevated.   In review of notes from 05/23/17, she was noting left shoulder pain, midepigastric discomfort as well with nausea. Has history of GERD. Her left shoulder pain also has heard in the past when she has a stomachache or if she has a cold she states. Sometimes her left shoulder will ache 4 hours and days.  In the past she has had CPK 290-300's. Stable.  No prior MI. +DM, no prior CVA. No early CAD. +HTN. Crestor because of DM.  PPI seems to be helping.   Chronic pedal edema.   Wt Readings from Last 3 Encounters:  05/25/17 172 lb (78 kg)  07/26/16 188 lb 6.4 oz (85.5 kg)  07/23/15 194 lb 12.8 oz (88.4 kg)     Past Medical History:  Diagnosis Date  . Acute meniscal tear of knee   . Basal cell carcinoma   . Colon polyp   . Diabetes ([place])   . Diverticulosis   . Esophageal reflux   . Fibrocystic breast   . Hemorrhoids   . HTN (hypertension)   . Hyperlipidemia   . Hypothyroidism   . OA (osteoarthritis) of hip   . Seasonal allergies   . Syncope     Current Outpatient Prescriptions  Medication Sig Dispense Refill  . amLODipine (NORVASC) 5 MG tablet Take 5 mg by mouth daily.    . Ascorbic Acid (VITAMIN C) 100 MG tablet  Take 100 mg by mouth daily.    Marland Kitchen aspirin 81 MG tablet Take 81 mg by mouth daily.    . Calcium Carbonate-Vitamin D (CALCIUM-D) 600-400 MG-UNIT TABS Take 1 tablet by mouth daily.    . fluticasone (FLONASE) 50 MCG/ACT nasal spray Place 2 sprays into both nostrils as needed for allergies or rhinitis.    Marland Kitchen levothyroxine (SYNTHROID, LEVOTHROID) 50 MCG tablet Take 50 mcg by mouth daily before breakfast.    . losartan (COZAAR) 50 MG tablet Take 1 tablet (50 mg total) by mouth daily.    Marland Kitchen omeprazole (PRILOSEC) 40 MG capsule Take 40 mg by mouth daily.     . ranitidine (ZANTAC) 300 MG tablet Take 300 mg by mouth at bedtime.    . rosuvastatin (CRESTOR) 5 MG tablet Take 5 mg by mouth daily.    . vitamin E 100 UNIT capsule Take by mouth daily.     No current facility-administered medications for this visit.     Allergies:    Allergies  Allergen Reactions  . Amoxicillin     REACTION: Rash  . Ciprofloxacin     myalgias  . Clarithromycin     REACTION: Nausea and vomiting  . Clindamycin/Lincomycin     Rash  . Hydrochlorothiazide  REACTION: Decreased Potassium and decreased Sodium  . Lipitor [Atorvastatin] Other (See Comments)    CPK  . Niaspan [Niacin Er] Other (See Comments)    CPK  . Triglide [Fenofibrate] Other (See Comments)    CPK  . Welchol [Colesevelam Hcl] Other (See Comments)    CPK  . Zetia [Ezetimibe] Other (See Comments)    CPK   . Zostavax [Zoster Vaccine Live]     Social History:  The patient  reports that she has never smoked. She has never used smokeless tobacco. She reports that she drinks alcohol. She reports that she does not use drugs.   Family history:   Family History  Problem Relation Age of Onset  . Colon cancer Mother   . Hypertension Mother   . Heart disease Mother   . Coronary artery disease Mother   . Pancreatic cancer Father   . Other Brother        CABG    ROS:  Please see the history of present illness.  All other systems reviewed and negative.     PHYSICAL EXAM: VS:  BP (!) 152/88   Pulse 68   Ht 5\' 5"  (1.651 m)   Wt 172 lb (78 kg)   BMI 28.62 kg/m  GEN: Well nourished, well developed, in no acute distress  HEENT: normal  Neck: no JVD, carotid bruits, or masses Cardiac: RRR; no murmurs, rubs, or gallops,no edema  Respiratory:  clear to auscultation bilaterally, normal work of breathing GI: soft, nontender, nondistended, + BS MS: no deformity or atrophy  Skin: warm and dry, no rash Neuro:  Alert and Oriented x 3, Strength and sensation are intact Psych: euthymic mood, full affect   EKG:    Most recent ECG from 05/23/17 no change from before. T-wave inversions noted. 06/25/16 - SB 51 TWI V3-5. 06/03/15-personally viewed-Sinus rhythm rate 62 bpm QT interval 475. T-wave inversions are old.  ASSESSMENT AND PLAN:  Problem List Items Addressed This Visit    Essential hypertension, benign   Elevated CPK   Atypical chest pain - Primary   Relevant Orders   ECHOCARDIOGRAM COMPLETE      1. Abnormal ECG - with her history of HTN, deep T wave inversion. Prior heart catheterization reassuring. Unchanged. 2. Hypertension-well-controlled.  No changes in medication. 3. Prior atypical chest pain-previously has been helped with proton pump inhibitor. Reassuring cardiac catheterization in 2012. We discussed once again. I think it is reasonable to recheck an echocardiogram given that it is been 3 years. Previously normal function. I think that her pain is mostly GI related. 4. Obesity-good job with recent weight loss.  5. Hyperlipidemia - low dose statin. Crestor 5. Unchanged. No myalgia. 6. Elevated CPK-Chronic issue, no change.  7. 6 month follow up.

## 2017-05-30 ENCOUNTER — Other Ambulatory Visit: Payer: Self-pay

## 2017-05-30 ENCOUNTER — Ambulatory Visit (HOSPITAL_COMMUNITY): Payer: Medicare Other | Attending: Internal Medicine

## 2017-05-30 DIAGNOSIS — R0789 Other chest pain: Secondary | ICD-10-CM | POA: Insufficient documentation

## 2017-05-30 DIAGNOSIS — E785 Hyperlipidemia, unspecified: Secondary | ICD-10-CM | POA: Diagnosis not present

## 2017-05-30 DIAGNOSIS — I10 Essential (primary) hypertension: Secondary | ICD-10-CM | POA: Insufficient documentation

## 2017-05-30 DIAGNOSIS — I351 Nonrheumatic aortic (valve) insufficiency: Secondary | ICD-10-CM | POA: Insufficient documentation

## 2017-06-23 DIAGNOSIS — E039 Hypothyroidism, unspecified: Secondary | ICD-10-CM | POA: Diagnosis not present

## 2017-06-23 DIAGNOSIS — E119 Type 2 diabetes mellitus without complications: Secondary | ICD-10-CM | POA: Diagnosis not present

## 2017-06-23 DIAGNOSIS — Z23 Encounter for immunization: Secondary | ICD-10-CM | POA: Diagnosis not present

## 2017-06-23 DIAGNOSIS — E7800 Pure hypercholesterolemia, unspecified: Secondary | ICD-10-CM | POA: Diagnosis not present

## 2017-06-23 DIAGNOSIS — I10 Essential (primary) hypertension: Secondary | ICD-10-CM | POA: Diagnosis not present

## 2017-06-23 DIAGNOSIS — K219 Gastro-esophageal reflux disease without esophagitis: Secondary | ICD-10-CM | POA: Diagnosis not present

## 2017-07-19 DIAGNOSIS — H524 Presbyopia: Secondary | ICD-10-CM | POA: Diagnosis not present

## 2017-07-19 DIAGNOSIS — E119 Type 2 diabetes mellitus without complications: Secondary | ICD-10-CM | POA: Diagnosis not present

## 2017-07-19 DIAGNOSIS — H5203 Hypermetropia, bilateral: Secondary | ICD-10-CM | POA: Diagnosis not present

## 2017-07-22 ENCOUNTER — Other Ambulatory Visit: Payer: Self-pay | Admitting: Family Medicine

## 2017-07-22 DIAGNOSIS — Z1231 Encounter for screening mammogram for malignant neoplasm of breast: Secondary | ICD-10-CM

## 2017-08-02 ENCOUNTER — Ambulatory Visit: Payer: Medicare Other

## 2017-08-26 DIAGNOSIS — L821 Other seborrheic keratosis: Secondary | ICD-10-CM | POA: Diagnosis not present

## 2017-08-26 DIAGNOSIS — Z85828 Personal history of other malignant neoplasm of skin: Secondary | ICD-10-CM | POA: Diagnosis not present

## 2017-08-26 DIAGNOSIS — D229 Melanocytic nevi, unspecified: Secondary | ICD-10-CM | POA: Diagnosis not present

## 2017-08-26 DIAGNOSIS — L814 Other melanin hyperpigmentation: Secondary | ICD-10-CM | POA: Diagnosis not present

## 2017-08-26 DIAGNOSIS — L570 Actinic keratosis: Secondary | ICD-10-CM | POA: Diagnosis not present

## 2017-08-29 ENCOUNTER — Ambulatory Visit
Admission: RE | Admit: 2017-08-29 | Discharge: 2017-08-29 | Disposition: A | Discharge status: Home / Self Care | Payer: Medicare Other | Source: Ambulatory Visit | Attending: Family Medicine | Admitting: Family Medicine

## 2017-08-29 DIAGNOSIS — Z1231 Encounter for screening mammogram for malignant neoplasm of breast: Secondary | ICD-10-CM | POA: Diagnosis not present

## 2017-09-07 DIAGNOSIS — B070 Plantar wart: Secondary | ICD-10-CM | POA: Diagnosis not present

## 2017-09-07 DIAGNOSIS — R350 Frequency of micturition: Secondary | ICD-10-CM | POA: Diagnosis not present

## 2017-10-04 DIAGNOSIS — R350 Frequency of micturition: Secondary | ICD-10-CM | POA: Diagnosis not present

## 2017-10-21 DIAGNOSIS — M25512 Pain in left shoulder: Secondary | ICD-10-CM | POA: Diagnosis not present

## 2017-10-21 DIAGNOSIS — R109 Unspecified abdominal pain: Secondary | ICD-10-CM | POA: Diagnosis not present

## 2017-10-21 DIAGNOSIS — R079 Chest pain, unspecified: Secondary | ICD-10-CM | POA: Diagnosis not present

## 2017-11-08 DIAGNOSIS — R600 Localized edema: Secondary | ICD-10-CM | POA: Diagnosis not present

## 2017-11-08 DIAGNOSIS — I10 Essential (primary) hypertension: Secondary | ICD-10-CM | POA: Diagnosis not present

## 2017-11-08 DIAGNOSIS — J301 Allergic rhinitis due to pollen: Secondary | ICD-10-CM | POA: Diagnosis not present

## 2017-11-08 DIAGNOSIS — N3000 Acute cystitis without hematuria: Secondary | ICD-10-CM | POA: Diagnosis not present

## 2017-11-08 DIAGNOSIS — M791 Myalgia, unspecified site: Secondary | ICD-10-CM | POA: Diagnosis not present

## 2017-11-08 DIAGNOSIS — E039 Hypothyroidism, unspecified: Secondary | ICD-10-CM | POA: Diagnosis not present

## 2017-11-08 DIAGNOSIS — K219 Gastro-esophageal reflux disease without esophagitis: Secondary | ICD-10-CM | POA: Diagnosis not present

## 2017-11-08 DIAGNOSIS — E7800 Pure hypercholesterolemia, unspecified: Secondary | ICD-10-CM | POA: Diagnosis not present

## 2017-11-21 DIAGNOSIS — N3021 Other chronic cystitis with hematuria: Secondary | ICD-10-CM | POA: Diagnosis not present

## 2017-11-21 DIAGNOSIS — R278 Other lack of coordination: Secondary | ICD-10-CM | POA: Diagnosis not present

## 2017-11-21 DIAGNOSIS — K5909 Other constipation: Secondary | ICD-10-CM | POA: Diagnosis not present

## 2017-11-22 ENCOUNTER — Ambulatory Visit (INDEPENDENT_AMBULATORY_CARE_PROVIDER_SITE_OTHER): Payer: Medicare Other | Admitting: Cardiology

## 2017-11-22 ENCOUNTER — Encounter: Payer: Self-pay | Admitting: Cardiology

## 2017-11-22 VITALS — BP 150/70 | HR 110 | Ht 65.0 in | Wt 185.0 lb

## 2017-11-22 DIAGNOSIS — I10 Essential (primary) hypertension: Secondary | ICD-10-CM | POA: Diagnosis not present

## 2017-11-22 DIAGNOSIS — R748 Abnormal levels of other serum enzymes: Secondary | ICD-10-CM

## 2017-11-22 DIAGNOSIS — R9431 Abnormal electrocardiogram [ECG] [EKG]: Secondary | ICD-10-CM

## 2017-11-22 DIAGNOSIS — R0789 Other chest pain: Secondary | ICD-10-CM

## 2017-11-22 NOTE — Progress Notes (Signed)
Patient ID: [name], female   DOB: 1946/08/29, 72 y.o.   MRN: [id number]    Date:  11/22/2017   ID:  [name], DOB May 18, 1946, MRN [id number]  PCP:  Shirline Frees, MD  Primary Cardiologist:  Marlou Porch     History of Present Illness: [name] is a 72 y.o. female with hyperlipidemia, hypertension, prior syncope due to elevated Azor dose, prior hyponatremia on a mild diuretic cardiac catheterization with no angiographically significant coronary artery disease 3/12, abnormal EKG with T-wave inversions here for follow up of chest pain at the request of Dr. Moreen Fowler.   EKG on 05/23/17 personally reviewed showed heart rate of 48 bpm sinus rhythm with T-wave inversion noted in V3 through V5. This is unchanged from her prior EKG.  BP was mildly elevated.   In review of notes from 05/23/17, she was noting left shoulder pain, midepigastric discomfort as well with nausea. Has history of GERD. Her left shoulder pain also has heard in the past when she has a stomachache or if she has a cold she states. Sometimes her left shoulder will ache 4 hours and days.  In the past she has had CPK 290-300's. Stable.  No prior MI. +DM, no prior CVA. No early CAD. +HTN. Crestor because of DM.  PPI seems to be helping.   Chronic pedal edema.  11/22/17 -overall doing well with no significant changes, no fevers chills nausea vomiting syncope bleeding.  On recheck, pulse was in the 60s.  Perhaps this was sinus tachycardia as she was coming into the room.  She is currently battling with urinary tract infection and some discomfort and slightly elevated blood pressures.  No chest pain return.  Usually GI discomfort.  No syncope bleeding orthopnea PND.   Wt Readings from Last 3 Encounters:  11/22/17 185 lb (83.9 kg)  05/25/17 183 lb 12.8 oz (83.4 kg)  07/26/16 188 lb 6.4 oz (85.5 kg)     Past Medical History:  Diagnosis Date  . Acute meniscal tear of knee   . Basal cell carcinoma   . Colon polyp   .  Diabetes ([place])   . Diverticulosis   . Esophageal reflux   . Fibrocystic breast   . Hemorrhoids   . HTN (hypertension)   . Hyperlipidemia   . Hypothyroidism   . OA (osteoarthritis) of hip   . Seasonal allergies   . Syncope     Current Outpatient Medications  Medication Sig Dispense Refill  . amLODipine (NORVASC) 5 MG tablet Take 5 mg by mouth daily.    . Ascorbic Acid (VITAMIN C) 100 MG tablet Take 100 mg by mouth daily.    Marland Kitchen aspirin 81 MG tablet Take 81 mg by mouth daily.    . Calcium Carbonate-Vitamin D (CALCIUM-D) 600-400 MG-UNIT TABS Take 1 tablet by mouth daily.    . fluticasone (FLONASE) 50 MCG/ACT nasal spray Place 2 sprays into both nostrils as needed for allergies or rhinitis.    Marland Kitchen levothyroxine (SYNTHROID, LEVOTHROID) 50 MCG tablet Take 50 mcg by mouth daily before breakfast.    . losartan (COZAAR) 50 MG tablet Take 1 tablet (50 mg total) by mouth daily.    Marland Kitchen omeprazole (PRILOSEC) 40 MG capsule Take 40 mg by mouth daily.     . ranitidine (ZANTAC) 300 MG tablet Take 300 mg by mouth at bedtime.    . rosuvastatin (CRESTOR) 5 MG tablet Take 5 mg by mouth daily.    . vitamin E 100 UNIT capsule  Take by mouth daily.     No current facility-administered medications for this visit.     Allergies:    Allergies  Allergen Reactions  . Amoxicillin     REACTION: Rash  . Ciprofloxacin     myalgias  . Clarithromycin     REACTION: Nausea and vomiting  . Clindamycin/Lincomycin     Rash  . Hydrochlorothiazide     REACTION: Decreased Potassium and decreased Sodium  . Lipitor [Atorvastatin] Other (See Comments)    CPK  . Niaspan [Niacin Er] Other (See Comments)    CPK  . Triglide [Fenofibrate] Other (See Comments)    CPK  . Welchol [Colesevelam Hcl] Other (See Comments)    CPK  . Zetia [Ezetimibe] Other (See Comments)    CPK   . Zostavax [Zoster Vaccine Live]     Social History:  The patient  reports that  has never smoked. she has never used smokeless tobacco. She  reports that she drinks alcohol. She reports that she does not use drugs.   Family history:   Family History  Problem Relation Age of Onset  . Colon cancer Mother   . Hypertension Mother   . Heart disease Mother   . Coronary artery disease Mother   . Pancreatic cancer Father   . Other Brother        CABG    ROS:  Please see the history of present illness.  All other systems reviewed and negative.   PHYSICAL EXAM: VS:  BP (!) 150/70   Pulse (!) 110   Ht 5\' 5"  (1.651 m)   Wt 185 lb (83.9 kg)   BMI 30.79 kg/m  GEN: Well nourished, well developed, in no acute distress  HEENT: normal  Neck: no JVD, carotid bruits, or masses Cardiac: RRR; no murmurs, rubs, or gallops,mild ankle edema  Respiratory:  clear to auscultation bilaterally, normal work of breathing GI: soft, nontender, nondistended, + BS MS: no deformity or atrophy  Skin: warm and dry, no rash Neuro:  Alert and Oriented x 3, Strength and sensation are intact Psych: euthymic mood, full affect    EKG:    Most recent ECG from 05/23/17 no change from before. T-wave inversions noted. 06/25/16 - SB 51 TWI V3-5. 06/03/15-personally viewed-Sinus rhythm rate 62 bpm QT interval 475. T-wave inversions are old.  ECHO: 05/30/17 Reassuring normal left ventricular ejection fraction, mild aortic regurgitation. Candee Furbish, MD  ASSESSMENT AND PLAN:  Problem List Items Addressed This Visit    Essential hypertension, benign   Elevated CPK   Abnormal EKG   Atypical chest pain - Primary      1. Abnormal ECG - with her history of HTN, deep T wave inversion. Prior heart catheterization reassuring. Unchanged. 2. Hypertension-usually well-controlled.  UTI, labile. Discomfort. No changes in medication. Watch 3. Prior atypical chest pain-previously has been helped with proton pump inhibitor. Reassuring cardiac catheterization in 2012. Likely GI related.  4. Obesity-good job with recent weight loss.  5. Hyperlipidemia - low dose statin.  Crestor 5. Unchanged. No myalgia. 6. Elevated CPK-250, Chronic issue, no change.  7. 12 month follow up.

## 2017-11-22 NOTE — Patient Instructions (Signed)

## 2017-12-05 DIAGNOSIS — N3021 Other chronic cystitis with hematuria: Secondary | ICD-10-CM | POA: Diagnosis not present

## 2017-12-05 DIAGNOSIS — K5730 Diverticulosis of large intestine without perforation or abscess without bleeding: Secondary | ICD-10-CM | POA: Diagnosis not present

## 2017-12-16 DIAGNOSIS — R197 Diarrhea, unspecified: Secondary | ICD-10-CM | POA: Diagnosis not present

## 2017-12-16 DIAGNOSIS — N3021 Other chronic cystitis with hematuria: Secondary | ICD-10-CM | POA: Diagnosis not present

## 2017-12-30 DIAGNOSIS — N390 Urinary tract infection, site not specified: Secondary | ICD-10-CM | POA: Diagnosis not present

## 2017-12-30 DIAGNOSIS — I10 Essential (primary) hypertension: Secondary | ICD-10-CM | POA: Diagnosis not present

## 2017-12-30 DIAGNOSIS — R300 Dysuria: Secondary | ICD-10-CM | POA: Diagnosis not present

## 2017-12-30 DIAGNOSIS — E119 Type 2 diabetes mellitus without complications: Secondary | ICD-10-CM | POA: Diagnosis not present

## 2017-12-30 DIAGNOSIS — E039 Hypothyroidism, unspecified: Secondary | ICD-10-CM | POA: Diagnosis not present

## 2017-12-30 DIAGNOSIS — E7800 Pure hypercholesterolemia, unspecified: Secondary | ICD-10-CM | POA: Diagnosis not present

## 2018-01-26 DIAGNOSIS — I10 Essential (primary) hypertension: Secondary | ICD-10-CM | POA: Diagnosis not present

## 2018-01-26 DIAGNOSIS — R1084 Generalized abdominal pain: Secondary | ICD-10-CM | POA: Diagnosis not present

## 2018-01-27 ENCOUNTER — Other Ambulatory Visit: Payer: Self-pay | Admitting: Family Medicine

## 2018-01-27 ENCOUNTER — Ambulatory Visit
Admission: RE | Admit: 2018-01-27 | Discharge: 2018-01-27 | Disposition: A | Discharge status: Home / Self Care | Payer: Medicare Other | Source: Ambulatory Visit | Attending: Family Medicine | Admitting: Family Medicine

## 2018-01-27 DIAGNOSIS — R1084 Generalized abdominal pain: Secondary | ICD-10-CM

## 2018-01-27 DIAGNOSIS — R1030 Lower abdominal pain, unspecified: Secondary | ICD-10-CM | POA: Diagnosis not present

## 2018-02-02 DIAGNOSIS — R300 Dysuria: Secondary | ICD-10-CM | POA: Diagnosis not present

## 2018-03-29 DIAGNOSIS — L309 Dermatitis, unspecified: Secondary | ICD-10-CM | POA: Diagnosis not present

## 2018-03-29 DIAGNOSIS — I10 Essential (primary) hypertension: Secondary | ICD-10-CM | POA: Diagnosis not present

## 2018-04-21 DIAGNOSIS — E119 Type 2 diabetes mellitus without complications: Secondary | ICD-10-CM | POA: Diagnosis not present

## 2018-04-21 DIAGNOSIS — I10 Essential (primary) hypertension: Secondary | ICD-10-CM | POA: Diagnosis not present

## 2018-04-21 DIAGNOSIS — E039 Hypothyroidism, unspecified: Secondary | ICD-10-CM | POA: Diagnosis not present

## 2018-04-21 DIAGNOSIS — K219 Gastro-esophageal reflux disease without esophagitis: Secondary | ICD-10-CM | POA: Diagnosis not present

## 2018-04-21 DIAGNOSIS — E7800 Pure hypercholesterolemia, unspecified: Secondary | ICD-10-CM | POA: Diagnosis not present

## 2018-05-31 DIAGNOSIS — J0101 Acute recurrent maxillary sinusitis: Secondary | ICD-10-CM | POA: Diagnosis not present

## 2018-05-31 DIAGNOSIS — R300 Dysuria: Secondary | ICD-10-CM | POA: Diagnosis not present

## 2018-05-31 DIAGNOSIS — T50905A Adverse effect of unspecified drugs, medicaments and biological substances, initial encounter: Secondary | ICD-10-CM | POA: Diagnosis not present

## 2018-06-15 DIAGNOSIS — N3021 Other chronic cystitis with hematuria: Secondary | ICD-10-CM | POA: Diagnosis not present

## 2018-07-18 ENCOUNTER — Other Ambulatory Visit: Payer: Self-pay | Admitting: Family Medicine

## 2018-07-18 DIAGNOSIS — Z1231 Encounter for screening mammogram for malignant neoplasm of breast: Secondary | ICD-10-CM

## 2018-07-25 DIAGNOSIS — E119 Type 2 diabetes mellitus without complications: Secondary | ICD-10-CM | POA: Diagnosis not present

## 2018-07-25 DIAGNOSIS — H524 Presbyopia: Secondary | ICD-10-CM | POA: Diagnosis not present

## 2018-08-24 DIAGNOSIS — Z85828 Personal history of other malignant neoplasm of skin: Secondary | ICD-10-CM | POA: Diagnosis not present

## 2018-08-24 DIAGNOSIS — D1801 Hemangioma of skin and subcutaneous tissue: Secondary | ICD-10-CM | POA: Diagnosis not present

## 2018-08-24 DIAGNOSIS — D225 Melanocytic nevi of trunk: Secondary | ICD-10-CM | POA: Diagnosis not present

## 2018-08-24 DIAGNOSIS — L821 Other seborrheic keratosis: Secondary | ICD-10-CM | POA: Diagnosis not present

## 2018-08-30 ENCOUNTER — Ambulatory Visit
Admission: RE | Admit: 2018-08-30 | Discharge: 2018-08-30 | Disposition: A | Discharge status: Home / Self Care | Payer: Medicare Other | Source: Ambulatory Visit | Attending: Family Medicine | Admitting: Family Medicine

## 2018-08-30 DIAGNOSIS — Z1231 Encounter for screening mammogram for malignant neoplasm of breast: Secondary | ICD-10-CM | POA: Diagnosis not present

## 2018-10-27 ENCOUNTER — Encounter: Payer: Self-pay | Admitting: Cardiology

## 2018-11-08 DIAGNOSIS — R1013 Epigastric pain: Secondary | ICD-10-CM | POA: Diagnosis not present

## 2018-11-08 DIAGNOSIS — R0789 Other chest pain: Secondary | ICD-10-CM | POA: Diagnosis not present

## 2018-11-22 DIAGNOSIS — N898 Other specified noninflammatory disorders of vagina: Secondary | ICD-10-CM | POA: Diagnosis not present

## 2018-11-22 DIAGNOSIS — R399 Unspecified symptoms and signs involving the genitourinary system: Secondary | ICD-10-CM | POA: Diagnosis not present

## 2018-11-24 ENCOUNTER — Ambulatory Visit (INDEPENDENT_AMBULATORY_CARE_PROVIDER_SITE_OTHER): Payer: Medicare Other | Admitting: Cardiology

## 2018-11-24 ENCOUNTER — Encounter: Payer: Self-pay | Admitting: Cardiology

## 2018-11-24 ENCOUNTER — Encounter: Payer: Self-pay | Admitting: *Deleted

## 2018-11-24 VITALS — BP 138/72 | HR 55 | Ht 65.0 in | Wt 177.8 lb

## 2018-11-24 DIAGNOSIS — R0789 Other chest pain: Secondary | ICD-10-CM

## 2018-11-24 DIAGNOSIS — I10 Essential (primary) hypertension: Secondary | ICD-10-CM

## 2018-11-24 DIAGNOSIS — R748 Abnormal levels of other serum enzymes: Secondary | ICD-10-CM | POA: Diagnosis not present

## 2018-11-24 DIAGNOSIS — R9431 Abnormal electrocardiogram [ECG] [EKG]: Secondary | ICD-10-CM

## 2018-11-24 DIAGNOSIS — Z01812 Encounter for preprocedural laboratory examination: Secondary | ICD-10-CM | POA: Diagnosis not present

## 2018-11-24 NOTE — Progress Notes (Signed)
Cardiology Office Note:    Date:  11/24/2018   ID:  [name], DOB 21-Jan-1946, MRN [id number]  PCP:  Shirline Frees, MD  Cardiologist:  Candee Furbish, MD  Electrophysiologist:  None   Referring MD: Shirline Frees, MD     History of Present Illness:    [name] is a 73 y.o. female here for the follow-up of prior syncope hyperlipidemia hypertension with diabetes.  Several years ago had syncope after increasing her dose of Azor.  Cardiac catheterization previously showed no significant coronary artery disease.   EKG previously showed T wave inversion in V3 through V5.  Previous left shoulder pain midepigastric discomfort occasional nausea.  She also had mildly elevated CKs in the 290s to 300s which have been stable.  No prior MI, no prior strokes.  Has chronic pedal edema.  Was sick for most of the year. Stomach, left arm discomfort, sweats, Pain acrorss stomach. No cough. Stomach still iffy.  Also worried about her heart rate mainly in the 50s.  She believes that it does not increase appropriately during exercise.  No syncope.  No bleeding no orthopnea.   Past Medical History:  Diagnosis Date  . Acute meniscal tear of knee   . Basal cell carcinoma   . Colon polyp   . Diabetes ([place])   . Diverticulosis   . Esophageal reflux   . Fibrocystic breast   . Hemorrhoids   . HTN (hypertension)   . Hyperlipidemia   . Hypothyroidism   . OA (osteoarthritis) of hip   . Seasonal allergies   . Syncope     History reviewed. No pertinent surgical history.  Current Medications: Current Meds  Medication Sig  . amLODipine (NORVASC) 5 MG tablet Take 5 mg by mouth daily.  Marland Kitchen aspirin 81 MG tablet Take 81 mg by mouth daily.  Marland Kitchen esomeprazole (NEXIUM) 40 MG capsule Take 40 mg by mouth daily.  . famotidine (PEPCID) 40 MG tablet Take 40 mg by mouth daily.  . fluticasone (FLONASE) 50 MCG/ACT nasal spray Place 2 sprays into both nostrils as needed for allergies or rhinitis.  Marland Kitchen  levothyroxine (SYNTHROID, LEVOTHROID) 50 MCG tablet Take 50 mcg by mouth daily before breakfast.  . losartan (COZAAR) 100 MG tablet Take 100 mg by mouth daily.  . rosuvastatin (CRESTOR) 5 MG tablet Take 5 mg by mouth daily.     Allergies:   Amoxicillin; Ciprofloxacin; Clarithromycin; Clindamycin/lincomycin; Hydrochlorothiazide; Lipitor [atorvastatin]; Niaspan [niacin er]; Triglide [fenofibrate]; Welchol [colesevelam hcl]; Zetia [ezetimibe]; and Zostavax [zoster vaccine live]   Social History   Socioeconomic History  . Marital status: Married    Spouse name: Not on file  . Number of children: Not on file  . Years of education: Not on file  . Highest education level: Not on file  Occupational History  . Not on file  Social Needs  . Financial resource strain: Not on file  . Food insecurity:    Worry: Not on file    Inability: Not on file  . Transportation needs:    Medical: Not on file    Non-medical: Not on file  Tobacco Use  . Smoking status: Never Smoker  . Smokeless tobacco: Never Used  Substance and Sexual Activity  . Alcohol use: Yes    Alcohol/week: 0.0 standard drinks    Comment: Occassionally  . Drug use: No  . Sexual activity: Not on file  Lifestyle  . Physical activity:    Days per week: Not on file  Minutes per session: Not on file  . Stress: Not on file  Relationships  . Social connections:    Talks on phone: Not on file    Gets together: Not on file    Attends religious service: Not on file    Active member of club or organization: Not on file    Attends meetings of clubs or organizations: Not on file    Relationship status: Not on file  Other Topics Concern  . Not on file  Social History Narrative  . Not on file     Family History: The patient's family history includes Colon cancer in her mother; Coronary artery disease in her mother; Heart disease in her mother; Hypertension in her mother; Other in her brother; Pancreatic cancer in her  father.  ROS:   Please see the history of present illness.     All other systems reviewed and are negative.  EKGs/Labs/Other Studies Reviewed:    The following studies were reviewed today: Prior office note lab work EKG  EKG:  EKG is not ordered today.  Previously shows fairly diffuse T wave inversions, personally reviewed and interpreted.  Recent Labs: No results found for requested labs within last 8760 hours.  Recent Lipid Panel No results found for: CHOL, TRIG, HDL, CHOLHDL, VLDL, LDLCALC, LDLDIRECT  Physical Exam:    VS:  BP 138/72   Pulse (!) 55   Ht 5\' 5"  (1.651 m)   Wt 177 lb 12.8 oz (80.6 kg)   SpO2 98%   BMI 29.59 kg/m     Wt Readings from Last 3 Encounters:  11/24/18 177 lb 12.8 oz (80.6 kg)  11/22/17 185 lb (83.9 kg)  05/25/17 183 lb 12.8 oz (83.4 kg)     GEN:  Well nourished, well developed in no acute distress HEENT: Normal NECK: No JVD; No carotid bruits LYMPHATICS: No lymphadenopathy CARDIAC: brady reg, no murmurs, rubs, gallops RESPIRATORY:  Clear to auscultation without rales, wheezing or rhonchi  ABDOMEN: Soft, non-tender, non-distended MUSCULOSKELETAL:  No edema; No deformity  SKIN: Warm and dry NEUROLOGIC:  Alert and oriented x 3 PSYCHIATRIC:  Normal affect   ASSESSMENT:    1. Atypical chest pain   2. Elevated CPK   3. Essential hypertension, benign   4. Abnormal EKG   5. Pre-procedure lab exam    PLAN:    In order of problems listed above:  Abnormal EKG - Prior heart catheterization reassuring this was in 2012.  Deep T wave inversion noted previously.  Dr. Kenton Kingfisher EKG reportedly unchanged from this.  Because of her atypical chest pain symptoms, across her stomach, some left shoulder left arm, we will go ahead and proceed with cardiac CT with possible FFR since it is been several years since we have evaluated her coronary arteries.  Essential hypertension with diabetes - Currently controlled medications reviewed. Recently increased  losartan 100mg .   Atypical chest pain - Off-and-on over the past few years.  Cardiac cath 2012 reassuring.  Thought to be mostly GI related.  Since it is been several years checking CT scan of coronary arteries.  Hyperlipidemia -Continue with low-dose statin.  No myalgias.  CPK has been mildly elevated in the past chronically.  Elevated CPK -Chronic issue no changes.  Bradycardia - No syncope, no high risk symptoms.  She is concerned because her heart rate does not increase that much during activity.  Continue to monitor for evidence of chronotropic incompetence.  Reassured her that increasing losartan should not decrease her heart  rate.     Medication Adjustments/Labs and Tests Ordered: Current medicines are reviewed at length with the patient today.  Concerns regarding medicines are outlined above.  Orders Placed This Encounter  Procedures  . CT CORONARY MORPH W/CTA COR W/SCORE W/CA W/CM &/OR WO/CM  . CT CORONARY FRACTIONAL FLOW RESERVE DATA PREP  . CT CORONARY FRACTIONAL FLOW RESERVE FLUID ANALYSIS  . Basic metabolic panel   No orders of the defined types were placed in this encounter.   Patient Instructions  Medication Instructions:  The current medical regimen is effective;  continue present plan and medications.  If you need a refill on your cardiac medications before your next appointment, please call your pharmacy.   Lab work: Please have blood work before your CT scan. (BMP) If you have labs (blood work) drawn today and your tests are completely normal, you will receive your results only by: Marland Kitchen MyChart Message (if you have MyChart) OR . A paper copy in the mail If you have any lab test that is abnormal or we need to change your treatment, we will call you to review the results.  Testing/Procedures: Your physician has requested that you have Coronary CT. Cardiac computed tomography (CT) is a painless test that uses an x-ray machine to take clear, detailed pictures of  your heart. For further information please visit HugeFiesta.tn. Please follow instruction sheet as given.  Follow-Up: At [facility], you and your health needs are our priority.  As part of our continuing mission to provide you with exceptional heart care, we have created designated Provider Care Teams.  These Care Teams include your primary Cardiologist (physician) and Advanced Practice Providers (APPs -  Physician Assistants and Nurse Practitioners) who all work together to provide you with the care you need, when you need it. You will need a follow up appointment in 12 months.  Please call our office 2 months in advance to schedule this appointment.  You may see Candee Furbish, MD or one of the following Advanced Practice Providers on your designated Care Team:   Truitt Merle, NP Cecilie Kicks, NP . Kathyrn Drown, NP  Thank you for choosing [facility]!!          Signed, Candee Furbish, MD  11/24/2018 9:51 AM    [place]

## 2018-11-24 NOTE — Patient Instructions (Signed)
Medication Instructions:  The current medical regimen is effective;  continue present plan and medications.  If you need a refill on your cardiac medications before your next appointment, please call your pharmacy.   Lab work: Please have blood work before your CT scan. (BMP) If you have labs (blood work) drawn today and your tests are completely normal, you will receive your results only by: Marland Kitchen MyChart Message (if you have MyChart) OR . A paper copy in the mail If you have any lab test that is abnormal or we need to change your treatment, we will call you to review the results.  Testing/Procedures: Your physician has requested that you have Coronary CT. Cardiac computed tomography (CT) is a painless test that uses an x-ray machine to take clear, detailed pictures of your heart. For further information please visit HugeFiesta.tn. Please follow instruction sheet as given.  Follow-Up: At [facility], you and your health needs are our priority.  As part of our continuing mission to provide you with exceptional heart care, we have created designated Provider Care Teams.  These Care Teams include your primary Cardiologist (physician) and Advanced Practice Providers (APPs -  Physician Assistants and Nurse Practitioners) who all work together to provide you with the care you need, when you need it. You will need a follow up appointment in 12 months.  Please call our office 2 months in advance to schedule this appointment.  You may see Candee Furbish, MD or one of the following Advanced Practice Providers on your designated Care Team:   Truitt Merle, NP Cecilie Kicks, NP . Kathyrn Drown, NP  Thank you for choosing [facility]!!

## 2018-12-01 ENCOUNTER — Other Ambulatory Visit (HOSPITAL_COMMUNITY)
Admission: RE | Admit: 2018-12-01 | Discharge: 2018-12-01 | Disposition: A | Discharge status: Home / Self Care | Payer: Medicare Other | Source: Ambulatory Visit | Attending: Family Medicine | Admitting: Family Medicine

## 2018-12-01 ENCOUNTER — Other Ambulatory Visit: Payer: Self-pay | Admitting: Family Medicine

## 2018-12-01 DIAGNOSIS — E039 Hypothyroidism, unspecified: Secondary | ICD-10-CM | POA: Diagnosis not present

## 2018-12-01 DIAGNOSIS — Z124 Encounter for screening for malignant neoplasm of cervix: Secondary | ICD-10-CM | POA: Insufficient documentation

## 2018-12-01 DIAGNOSIS — K219 Gastro-esophageal reflux disease without esophagitis: Secondary | ICD-10-CM | POA: Diagnosis not present

## 2018-12-01 DIAGNOSIS — E119 Type 2 diabetes mellitus without complications: Secondary | ICD-10-CM | POA: Diagnosis not present

## 2018-12-01 DIAGNOSIS — J301 Allergic rhinitis due to pollen: Secondary | ICD-10-CM | POA: Diagnosis not present

## 2018-12-01 DIAGNOSIS — I10 Essential (primary) hypertension: Secondary | ICD-10-CM | POA: Diagnosis not present

## 2018-12-01 DIAGNOSIS — Z0000 Encounter for general adult medical examination without abnormal findings: Secondary | ICD-10-CM | POA: Diagnosis not present

## 2018-12-01 DIAGNOSIS — E7800 Pure hypercholesterolemia, unspecified: Secondary | ICD-10-CM | POA: Diagnosis not present

## 2018-12-04 LAB — CYTOLOGY - PAP: Diagnosis: NEGATIVE

## 2018-12-06 ENCOUNTER — Other Ambulatory Visit: Payer: Medicare Other | Admitting: *Deleted

## 2018-12-06 DIAGNOSIS — R9431 Abnormal electrocardiogram [ECG] [EKG]: Secondary | ICD-10-CM | POA: Diagnosis not present

## 2018-12-06 DIAGNOSIS — R0789 Other chest pain: Secondary | ICD-10-CM | POA: Diagnosis not present

## 2018-12-06 DIAGNOSIS — I10 Essential (primary) hypertension: Secondary | ICD-10-CM | POA: Diagnosis not present

## 2018-12-06 DIAGNOSIS — Z01812 Encounter for preprocedural laboratory examination: Secondary | ICD-10-CM

## 2018-12-06 LAB — BASIC METABOLIC PANEL
BUN/Creatinine Ratio: 16 (ref 12–28)
BUN: 14 mg/dL (ref 8–27)
CO2: 22 mmol/L (ref 20–29)
CREATININE: 0.87 mg/dL (ref 0.57–1.00)
Calcium: 9.5 mg/dL (ref 8.7–10.3)
Chloride: 96 mmol/L (ref 96–106)
GFR calc Af Amer: 78 mL/min/{1.73_m2} (ref >59)
GFR calc non Af Amer: 67 mL/min/{1.73_m2} (ref >59)
GLUCOSE: 120 mg/dL — AB (ref 65–99)
Potassium: 4.3 mmol/L (ref 3.5–5.2)
Sodium: 134 mmol/L (ref 134–144)

## 2018-12-25 ENCOUNTER — Telehealth (HOSPITAL_COMMUNITY): Payer: Self-pay | Admitting: Emergency Medicine

## 2018-12-25 NOTE — Telephone Encounter (Signed)
Reaching out to patient to offer assistance regarding upcoming cardiac imaging study; pt verbalizes understanding of appt date/time, parking situation and where to check in, pre-test NPO status and medications ordered, and verified current allergies; name and call back number provided for further questions should they arise Shawan Corella RN Navigator Cardiac Imaging [facility] [phone] office [phone] cell 

## 2018-12-26 ENCOUNTER — Ambulatory Visit (HOSPITAL_COMMUNITY): Admission: RE | Admit: 2018-12-26 | Payer: Medicare Other | Source: Ambulatory Visit

## 2018-12-26 ENCOUNTER — Ambulatory Visit (HOSPITAL_COMMUNITY)
Admission: RE | Admit: 2018-12-26 | Discharge: 2018-12-26 | Disposition: A | Discharge status: Home / Self Care | Payer: Medicare Other | Source: Ambulatory Visit | Attending: Cardiology | Admitting: Cardiology

## 2018-12-26 DIAGNOSIS — I10 Essential (primary) hypertension: Secondary | ICD-10-CM | POA: Insufficient documentation

## 2018-12-26 DIAGNOSIS — R9431 Abnormal electrocardiogram [ECG] [EKG]: Secondary | ICD-10-CM | POA: Insufficient documentation

## 2018-12-26 DIAGNOSIS — R0789 Other chest pain: Secondary | ICD-10-CM | POA: Diagnosis not present

## 2018-12-26 MED ORDER — NITROGLYCERIN 0.4 MG SL SUBL
SUBLINGUAL_TABLET | SUBLINGUAL | Status: AC
  Filled 2018-12-26: qty 2

## 2018-12-26 MED ORDER — NITROGLYCERIN 0.4 MG SL SUBL
0.8000 mg | SUBLINGUAL_TABLET | Freq: Once | SUBLINGUAL | Status: AC
  Administered 2018-12-26: 0.8 mg via SUBLINGUAL
  Filled 2018-12-26: qty 25

## 2018-12-26 MED ORDER — IOPAMIDOL (ISOVUE-370) INJECTION 76%
80.0000 mL | Freq: Once | INTRAVENOUS | Status: AC | PRN
  Administered 2018-12-26: 80 mL via INTRAVENOUS

## 2018-12-29 DIAGNOSIS — N390 Urinary tract infection, site not specified: Secondary | ICD-10-CM | POA: Diagnosis not present

## 2018-12-29 DIAGNOSIS — R300 Dysuria: Secondary | ICD-10-CM | POA: Diagnosis not present

## 2019-01-02 ENCOUNTER — Other Ambulatory Visit: Payer: Self-pay | Admitting: Gastroenterology

## 2019-01-02 DIAGNOSIS — R1310 Dysphagia, unspecified: Secondary | ICD-10-CM

## 2019-01-04 ENCOUNTER — Ambulatory Visit
Admission: RE | Admit: 2019-01-04 | Discharge: 2019-01-04 | Disposition: A | Discharge status: Home / Self Care | Payer: Medicare Other | Source: Ambulatory Visit | Attending: Gastroenterology | Admitting: Gastroenterology

## 2019-01-04 DIAGNOSIS — K449 Diaphragmatic hernia without obstruction or gangrene: Secondary | ICD-10-CM | POA: Diagnosis not present

## 2019-01-04 DIAGNOSIS — R1310 Dysphagia, unspecified: Secondary | ICD-10-CM

## 2019-01-04 DIAGNOSIS — K224 Dyskinesia of esophagus: Secondary | ICD-10-CM | POA: Diagnosis not present

## 2019-01-04 DIAGNOSIS — K219 Gastro-esophageal reflux disease without esophagitis: Secondary | ICD-10-CM | POA: Diagnosis not present

## 2019-01-05 DIAGNOSIS — R300 Dysuria: Secondary | ICD-10-CM | POA: Diagnosis not present

## 2019-01-05 DIAGNOSIS — N3000 Acute cystitis without hematuria: Secondary | ICD-10-CM | POA: Diagnosis not present

## 2019-01-10 DIAGNOSIS — R1314 Dysphagia, pharyngoesophageal phase: Secondary | ICD-10-CM | POA: Diagnosis not present

## 2019-06-14 DIAGNOSIS — E119 Type 2 diabetes mellitus without complications: Secondary | ICD-10-CM | POA: Diagnosis not present

## 2019-06-14 DIAGNOSIS — K219 Gastro-esophageal reflux disease without esophagitis: Secondary | ICD-10-CM | POA: Diagnosis not present

## 2019-06-14 DIAGNOSIS — Z23 Encounter for immunization: Secondary | ICD-10-CM | POA: Diagnosis not present

## 2019-06-14 DIAGNOSIS — E039 Hypothyroidism, unspecified: Secondary | ICD-10-CM | POA: Diagnosis not present

## 2019-06-14 DIAGNOSIS — R001 Bradycardia, unspecified: Secondary | ICD-10-CM | POA: Diagnosis not present

## 2019-06-14 DIAGNOSIS — I10 Essential (primary) hypertension: Secondary | ICD-10-CM | POA: Diagnosis not present

## 2019-06-14 DIAGNOSIS — E7800 Pure hypercholesterolemia, unspecified: Secondary | ICD-10-CM | POA: Diagnosis not present

## 2019-06-18 ENCOUNTER — Telehealth: Payer: Self-pay | Admitting: *Deleted

## 2019-06-18 ENCOUNTER — Other Ambulatory Visit: Payer: Self-pay | Admitting: *Deleted

## 2019-06-18 DIAGNOSIS — R001 Bradycardia, unspecified: Secondary | ICD-10-CM

## 2019-06-18 NOTE — Telephone Encounter (Signed)
[employer] to ship a 30 day cardiac event monitor to her home.  Instructions reviewed briefly as they are included in the monitor kit.

## 2019-06-21 ENCOUNTER — Ambulatory Visit (INDEPENDENT_AMBULATORY_CARE_PROVIDER_SITE_OTHER): Payer: Medicare Other

## 2019-06-21 DIAGNOSIS — R001 Bradycardia, unspecified: Secondary | ICD-10-CM

## 2019-06-26 DIAGNOSIS — E871 Hypo-osmolality and hyponatremia: Secondary | ICD-10-CM | POA: Diagnosis not present

## 2019-06-28 DIAGNOSIS — N3020 Other chronic cystitis without hematuria: Secondary | ICD-10-CM | POA: Diagnosis not present

## 2019-07-26 DIAGNOSIS — E119 Type 2 diabetes mellitus without complications: Secondary | ICD-10-CM | POA: Diagnosis not present

## 2019-07-26 DIAGNOSIS — H10413 Chronic giant papillary conjunctivitis, bilateral: Secondary | ICD-10-CM | POA: Diagnosis not present

## 2019-07-26 DIAGNOSIS — H52203 Unspecified astigmatism, bilateral: Secondary | ICD-10-CM | POA: Diagnosis not present

## 2019-07-30 ENCOUNTER — Other Ambulatory Visit: Payer: Self-pay | Admitting: Family Medicine

## 2019-07-30 DIAGNOSIS — Z1231 Encounter for screening mammogram for malignant neoplasm of breast: Secondary | ICD-10-CM

## 2019-08-07 ENCOUNTER — Telehealth: Payer: Self-pay

## 2019-08-07 NOTE — Telephone Encounter (Signed)
   TELEPHONE CALL NOTE  This patient has been deemed a candidate for follow-up tele-health visit to limit community exposure during the Covid-19 pandemic. I spoke with the patient via phone to discuss instructions. This has been outlined on the patient's AVS (dotphrase: [email or web address]). The patient was advised to review the section on consent for treatment as well. The patient will receive a phone call 2-3 days prior to their E-Visit at which time consent will be verbally confirmed.   A Virtual Office Visit appointment type has been scheduled for 08/08/19 with Dr. Marlou Porch, - patient prefers telephone type.   Janan Halter, RN 08/07/2019 3:35 PM

## 2019-08-07 NOTE — Telephone Encounter (Signed)
I called and left patient a message about appointment tomorrow with Dr. Marlou Porch. He will not be in the office, he can do a telehealth visit tomorrow or Truitt Merle has two openings tomorrow if patient wants to come in.

## 2019-08-08 ENCOUNTER — Other Ambulatory Visit: Payer: Self-pay

## 2019-08-08 ENCOUNTER — Telehealth (INDEPENDENT_AMBULATORY_CARE_PROVIDER_SITE_OTHER): Payer: Medicare Other | Admitting: Cardiology

## 2019-08-08 VITALS — BP 161/81 | HR 51 | Ht 65.0 in | Wt 186.0 lb

## 2019-08-08 DIAGNOSIS — R001 Bradycardia, unspecified: Secondary | ICD-10-CM | POA: Diagnosis not present

## 2019-08-08 DIAGNOSIS — R0789 Other chest pain: Secondary | ICD-10-CM

## 2019-08-08 DIAGNOSIS — R748 Abnormal levels of other serum enzymes: Secondary | ICD-10-CM

## 2019-08-08 DIAGNOSIS — I10 Essential (primary) hypertension: Secondary | ICD-10-CM

## 2019-08-08 DIAGNOSIS — R9431 Abnormal electrocardiogram [ECG] [EKG]: Secondary | ICD-10-CM

## 2019-08-08 NOTE — Progress Notes (Addendum)
Cardiology  Note:    Telemedicine visit via telephone encounter secondary to COVID-19 pandemic.  Education was provided.  Patient was consented for telephone visit prior to encounter by Kelli Churn, RN.  A total of 20 minutes was spent with patient and review of medical records.  Date:  08/08/2019   ID:  [name], DOB 10-Mar-1946, MRN [id number]  PCP:  Shirline Frees, MD  Cardiologist:  Candee Furbish, MD  Electrophysiologist:  None   Referring MD: Shirline Frees, MD     History of Present Illness:    [name] is a 73 y.o. female here for the follow-up of prior syncope hyperlipidemia hypertension with diabetes.  Several years ago had syncope after increasing her dose of Azor.  Cardiac catheterization previously showed no significant coronary artery disease.   EKG previously showed T wave inversion in V3 through V5.  Previous left shoulder pain midepigastric discomfort occasional nausea.  She also had mildly elevated CKs in the 290s to 300s which have been stable.  No prior MI, no prior strokes.  Has chronic pedal edema.  Was sick for most of the year. Stomach, left arm discomfort, sweats, Pain acrorss stomach. No cough. Stomach still iffy.  Also worried about her heart rate mainly in the 50s.  She believes that it does not increase appropriately during exercise.  No syncope.  No bleeding no orthopnea.  08/08/2019  -Here for follow-up of bradycardia.  Amlodipine cut back to 5mg  now. 160 SBP at times.  Concerned about low heart rate.  At times it can drop into the upper 30s.  She is not having any syncope.  Seems to have been a gradually worsening issue for her.  Thankfully no high risk symptoms at this point.  Coronary CT was reassuring given her atypical chest pain.  Denies any fevers chills nausea vomiting syncope bleeding.  She did have a 30-day event monitor and mid-to-late August.  We will find the results.   Past Medical History:  Diagnosis Date  . Acute meniscal tear  of knee   . Basal cell carcinoma   . Colon polyp   . Diabetes ([place])   . Diverticulosis   . Esophageal reflux   . Fibrocystic breast   . Hemorrhoids   . HTN (hypertension)   . Hyperlipidemia   . Hypothyroidism   . OA (osteoarthritis) of hip   . Seasonal allergies   . Syncope     No past surgical history on file.  Current Medications: Current Meds  Medication Sig  . amLODipine (NORVASC) 5 MG tablet Take 5 mg by mouth daily.  Marland Kitchen aspirin 81 MG tablet Take 81 mg by mouth daily.  Marland Kitchen esomeprazole (NEXIUM) 40 MG capsule Take 40 mg by mouth daily.  . famotidine (PEPCID) 40 MG tablet Take 40 mg by mouth daily.  . fluticasone (FLONASE) 50 MCG/ACT nasal spray Place 2 sprays into both nostrils as needed for allergies or rhinitis.  Marland Kitchen levothyroxine (SYNTHROID, LEVOTHROID) 50 MCG tablet Take 50 mcg by mouth daily before breakfast.  . losartan (COZAAR) 100 MG tablet Take 100 mg by mouth daily.  . rosuvastatin (CRESTOR) 5 MG tablet Take 5 mg by mouth daily.     Allergies:   Amoxicillin, Ciprofloxacin, Clarithromycin, Clindamycin/lincomycin, Hydrochlorothiazide, Lipitor [atorvastatin], Niaspan [niacin er], Triglide [fenofibrate], Welchol [colesevelam hcl], Zetia [ezetimibe], and Zostavax [zoster vaccine live]   Social History   Socioeconomic History  . Marital status: Married    Spouse name: Not on file  . Number of  children: Not on file  . Years of education: Not on file  . Highest education level: Not on file  Occupational History  . Not on file  Social Needs  . Financial resource strain: Not on file  . Food insecurity    Worry: Not on file    Inability: Not on file  . Transportation needs    Medical: Not on file    Non-medical: Not on file  Tobacco Use  . Smoking status: Never Smoker  . Smokeless tobacco: Never Used  Substance and Sexual Activity  . Alcohol use: Yes    Alcohol/week: 0.0 standard drinks    Comment: Occassionally  . Drug use: No  . Sexual activity: Not on file   Lifestyle  . Physical activity    Days per week: Not on file    Minutes per session: Not on file  . Stress: Not on file  Relationships  . Social Herbalist on phone: Not on file    Gets together: Not on file    Attends religious service: Not on file    Active member of club or organization: Not on file    Attends meetings of clubs or organizations: Not on file    Relationship status: Not on file  Other Topics Concern  . Not on file  Social History Narrative  . Not on file     Family History: The patient's family history includes Colon cancer in her mother; Coronary artery disease in her mother; Heart disease in her mother; Hypertension in her mother; Other in her brother; Pancreatic cancer in her father.  ROS:   Please see the history of present illness.     All other systems reviewed and are negative.  EKGs/Labs/Other Studies Reviewed:    The following studies were reviewed today: Prior office note lab work EKG  EKG:  EKG is not ordered today.  Previously shows fairly diffuse T wave inversions, personally reviewed and interpreted.  Recent Labs: 12/06/2018: BUN 14; Creatinine, Ser 0.87; Potassium 4.3; Sodium 134  Recent Lipid Panel No results found for: CHOL, TRIG, HDL, CHOLHDL, VLDL, LDLCALC, LDLDIRECT  Physical Exam:    VS:  BP (!) 161/81   Pulse (!) 51   Ht 5\' 5"  (1.651 m)   Wt 186 lb (84.4 kg)   BMI 30.95 kg/m     Wt Readings from Last 3 Encounters:  08/08/19 186 lb (84.4 kg)  11/24/18 177 lb 12.8 oz (80.6 kg)  11/22/17 185 lb (83.9 kg)     General-alert and oriented x3 no acute distress.  Pleasant.  Able to complete full sentences without difficulty.  ASSESSMENT:    1. Bradycardia   2. Atypical chest pain   3. Elevated CPK   4. Abnormal EKG   5. Essential hypertension, benign    PLAN:    In order of problems listed above:  Abnormal EKG - Prior heart catheterization reassuring this was in 2012.  Deep T wave inversion noted previously.   CT scanning of coronary arteries was performed on 12/26/2018 and this was normal.  Reassuring.   Possible esophageal issue - Seen on esophagram 12/2018.   Essential hypertension with diabetes - Currently controlled medications reviewed. Recently increased losartan 100mg .  Her amlodipine was decreased to 5 mg by Dr. Kenton Kingfisher to see if this helps at all with her heart rate.  Blood pressure mild to moderately elevated today.  She will continue to monitor.  Dr. Kenton Kingfisher is watching as well.  Atypical  chest pain - Off-and-on over the past few years.  Cardiac cath 2012 reassuring.  CT scan once again reassuring in 2020.  Thought to be mostly GI related.  Especially with findings of esophagram  Hyperlipidemia -Continue with low-dose statin.  No myalgias.  CPK has been mildly elevated in the past chronically.  Elevated CPK -Chronic issue no changes.  Bradycardia - No syncope, no high risk symptoms.   Continue to monitor for evidence of chronotropic incompetence.  Reassured her that increasing losartan should not decrease her heart rate. May need a pacemaker in the future.      Medication Adjustments/Labs and Tests Ordered: Current medicines are reviewed at length with the patient today.  Concerns regarding medicines are outlined above.  No orders of the defined types were placed in this encounter.  No orders of the defined types were placed in this encounter.   Patient Instructions  Medication Instructions:  The current medical regimen is effective;  continue present plan and medications.  If you need a refill on your cardiac medications before your next appointment, please call your pharmacy.   Follow-Up: At [facility], you and your health needs are our priority.  As part of our continuing mission to provide you with exceptional heart care, we have created designated Provider Care Teams.  These Care Teams include your primary Cardiologist (physician) and Advanced Practice Providers (APPs  -  Physician Assistants and Nurse Practitioners) who all work together to provide you with the care you need, when you need it. You will need a follow up appointment in 6 months with Cecilie Kicks, NP and Dr Gillian Shields in 1 year.  Please call our office 2 months in advance to schedule this appointment.  You may see Candee Furbish, MD or one of the following Advanced Practice Providers on your designated Care Team:   Truitt Merle, NP Cecilie Kicks, NP . Kathyrn Drown, NP  Thank you for choosing [facility]!!         Signed, Candee Furbish, MD  08/08/2019 4:47 PM    [place]

## 2019-08-08 NOTE — Patient Instructions (Signed)
Medication Instructions:  The current medical regimen is effective;  continue present plan and medications.  If you need a refill on your cardiac medications before your next appointment, please call your pharmacy.   Follow-Up: At [facility], you and your health needs are our priority.  As part of our continuing mission to provide you with exceptional heart care, we have created designated Provider Care Teams.  These Care Teams include your primary Cardiologist (physician) and Advanced Practice Providers (APPs -  Physician Assistants and Nurse Practitioners) who all work together to provide you with the care you need, when you need it. You will need a follow up appointment in 6 months with Cecilie Kicks, NP and Dr Gillian Shields in 1 year.  Please call our office 2 months in advance to schedule this appointment.  You may see Candee Furbish, MD or one of the following Advanced Practice Providers on your designated Care Team:   Truitt Merle, NP Cecilie Kicks, NP . Kathyrn Drown, NP  Thank you for choosing [facility]!!

## 2019-08-28 DIAGNOSIS — L821 Other seborrheic keratosis: Secondary | ICD-10-CM | POA: Diagnosis not present

## 2019-08-28 DIAGNOSIS — D1801 Hemangioma of skin and subcutaneous tissue: Secondary | ICD-10-CM | POA: Diagnosis not present

## 2019-08-28 DIAGNOSIS — Z85828 Personal history of other malignant neoplasm of skin: Secondary | ICD-10-CM | POA: Diagnosis not present

## 2019-08-28 DIAGNOSIS — D225 Melanocytic nevi of trunk: Secondary | ICD-10-CM | POA: Diagnosis not present

## 2019-09-13 ENCOUNTER — Ambulatory Visit
Admission: RE | Admit: 2019-09-13 | Discharge: 2019-09-13 | Disposition: A | Discharge status: Home / Self Care | Payer: Medicare Other | Source: Ambulatory Visit | Attending: Family Medicine | Admitting: Family Medicine

## 2019-09-13 ENCOUNTER — Other Ambulatory Visit: Payer: Self-pay

## 2019-09-13 DIAGNOSIS — Z1231 Encounter for screening mammogram for malignant neoplasm of breast: Secondary | ICD-10-CM | POA: Diagnosis not present

## 2019-09-18 DIAGNOSIS — E119 Type 2 diabetes mellitus without complications: Secondary | ICD-10-CM | POA: Diagnosis not present

## 2019-09-18 DIAGNOSIS — J0110 Acute frontal sinusitis, unspecified: Secondary | ICD-10-CM | POA: Diagnosis not present

## 2019-09-18 DIAGNOSIS — I10 Essential (primary) hypertension: Secondary | ICD-10-CM | POA: Diagnosis not present

## 2019-09-18 DIAGNOSIS — R300 Dysuria: Secondary | ICD-10-CM | POA: Diagnosis not present

## 2019-09-18 DIAGNOSIS — R001 Bradycardia, unspecified: Secondary | ICD-10-CM | POA: Diagnosis not present

## 2019-09-18 DIAGNOSIS — N3000 Acute cystitis without hematuria: Secondary | ICD-10-CM | POA: Diagnosis not present

## 2019-10-01 ENCOUNTER — Encounter: Payer: Self-pay | Admitting: Internal Medicine

## 2019-10-01 ENCOUNTER — Telehealth (INDEPENDENT_AMBULATORY_CARE_PROVIDER_SITE_OTHER): Payer: Medicare Other | Admitting: Internal Medicine

## 2019-10-01 ENCOUNTER — Other Ambulatory Visit: Payer: Self-pay

## 2019-10-01 VITALS — BP 138/67 | HR 44 | Ht 65.0 in | Wt 185.0 lb

## 2019-10-01 DIAGNOSIS — R001 Bradycardia, unspecified: Secondary | ICD-10-CM | POA: Diagnosis not present

## 2019-10-01 DIAGNOSIS — I10 Essential (primary) hypertension: Secondary | ICD-10-CM | POA: Diagnosis not present

## 2019-10-01 NOTE — Progress Notes (Signed)
Electrophysiology TeleHealth Note   Due to national recommendations of social distancing due to [place] 19, Audio telehealth visit is felt to be most appropriate for this patient at this time.  See MyChart message from today for patient consent regarding telehealth for [facility].   Date:  10/01/2019   ID:  [name], DOB Dec 06, 1945, MRN [id number]  Location: home Provider location:  [facility] Evaluation Performed: New patient consult  PCP:  Shirline Frees, MD  Cardiologist:  Candee Furbish, MD  Electrophysiologist:  None   Chief Complaint:  Slow heart beat  History of Present Illness:    [name] is a 73 y.o. female who presents via audio/video conferencing for a telehealth visit today.   The patient is referred for new consultation regarding bradycardia by Dr Marlou Porch.   The patient has chronic sinus bradycardia. She reports symptoms of fatigue and decreased exercise tolerance.  She finds that her heart rates are 30s-40s during these symptoms.  She also has symptoms of abdominal discomfort.  When she is active and feeling well, her heart rates are 50s and rarely 60s. She did have syncope 6-7 years ago after abdominal cramping.  She has chronic intermittent lower extremity edema. Today, she denies symptoms of palpitations, chest pain, shortness of breath, orthopnea, PND,  claudication, dizziness, presyncope, syncope, bleeding, or neurologic sequela. The patient is tolerating medications without difficulties and is otherwise without complaint today.   she denies symptoms of cough, fevers, chills, or new SOB worrisome for COVID 19.   Past Medical History:  Diagnosis Date  . Acute meniscal tear of knee   . Basal cell carcinoma   . Colon polyp   . Diabetes ([place])   . Diverticulosis   . Edema   . Esophageal reflux   . Fibrocystic breast   . Hemorrhoids   . HTN (hypertension)   . Hyperlipidemia   . Hyponatremia   . Hypothyroidism   . OA (osteoarthritis) of  hip   . Seasonal allergies   . Sinus bradycardia   . Syncope    vagal,  in the setting of GI cramping    Past Surgical History:  Procedure Laterality Date  . CHOLECYSTECTOMY    . TONSILLECTOMY    . tubal ligation      Current Outpatient Medications  Medication Sig Dispense Refill  . amLODipine (NORVASC) 5 MG tablet Take 5 mg by mouth daily.    Marland Kitchen aspirin 81 MG tablet Take 81 mg by mouth daily.    Marland Kitchen esomeprazole (NEXIUM) 40 MG capsule Take 40 mg by mouth daily.    . famotidine (PEPCID) 40 MG tablet Take 40 mg by mouth daily.    . fluticasone (FLONASE) 50 MCG/ACT nasal spray Place 2 sprays into both nostrils as needed for allergies or rhinitis.    Marland Kitchen levothyroxine (SYNTHROID, LEVOTHROID) 50 MCG tablet Take 50 mcg by mouth daily before breakfast.    . losartan (COZAAR) 100 MG tablet Take 100 mg by mouth daily.    . rosuvastatin (CRESTOR) 5 MG tablet Take 5 mg by mouth daily.     No current facility-administered medications for this visit.     Allergies:   Amoxicillin, Ciprofloxacin, Clarithromycin, Clindamycin/lincomycin, Hydrochlorothiazide, Lipitor [atorvastatin], Niaspan [niacin er], Triglide [fenofibrate], Welchol [colesevelam hcl], Zetia [ezetimibe], and Zostavax [zoster vaccine live]   Social History:  The patient  reports that she has never smoked. She has never used smokeless tobacco. She reports current alcohol use. She reports that she does  not use drugs.   Family History:  The patient's family history includes Colon cancer in her mother; Coronary artery disease in her mother; Heart disease in her mother; Hypertension in her mother; Other in her brother; Pancreatic cancer in her father.    ROS:  Please see the history of present illness.   All other systems are personally reviewed and negative.    Exam:    Vital Signs:  BP 138/67   Pulse (!) 44   Ht 5\' 5"  (1.651 m)   Wt 185 lb (83.9 kg)   BMI 30.79 kg/m   Well sounding, alert and conversant    Labs/Other Tests  and Data Reviewed:    Recent Labs: 12/06/2018: BUN 14; Creatinine, Ser 0.87; Potassium 4.3; Sodium 134   Wt Readings from Last 3 Encounters:  10/01/19 185 lb (83.9 kg)  08/08/19 186 lb (84.4 kg)  11/24/18 177 lb 12.8 oz (80.6 kg)     Other studies personally reviewed: Additional studies/ records that were reviewed today include: Dr Marlou Porch notes, prior ekg, recent event monitor , echo 2018 Review of the above records today demonstrates: as above  ASSESSMENT & PLAN:    1.  Sinus bradycardia The patient has symptomatic sinus bradycardia. No reversible causes have been found. I would therefore recommend pacemaker implantation at this time.  Risks, benefits, alternatives to pacemaker implantation were discussed in detail with the patient today. The patient understands that the risks include but are not limited to bleeding, infection, pneumothorax, perforation, tamponade, vascular damage, renal failure, MI, stroke, death,  and lead dislodgement and wishes to avoid the procedure.  Especially, with COVID 19, she is not interested in any hospital procedures.  She would prefer to continue watchful waiting.  She will contact my office if she changes her mind.  2. HTN Stable No change required today   Follow-up:  Follow-up with Dr Marlou Porch If she change her mind, I am happy to see for further pacemaker discussions.  Current medicines are reviewed at length with the patient today.   The patient does not have concerns regarding her medicines.  The following changes were made today:  none  Labs/ tests ordered today include:  No orders of the defined types were placed in this encounter.   Patient Risk:  after full review of this patients clinical status, I feel that they are at moderate risk at this time.   Today, I have spent 20 minutes with the patient with telehealth technology discussing bradycardia .    Signed, [name] MD, [place] 10/01/2019 9:16 AM   [address] [place] [address] [phone] (office) [phone] (fax)

## 2019-10-13 ENCOUNTER — Other Ambulatory Visit: Payer: Self-pay

## 2019-10-13 ENCOUNTER — Encounter (HOSPITAL_COMMUNITY): Payer: Self-pay | Admitting: *Deleted

## 2019-10-13 ENCOUNTER — Encounter (HOSPITAL_COMMUNITY): Payer: Self-pay | Admitting: Emergency Medicine

## 2019-10-13 ENCOUNTER — Emergency Department (HOSPITAL_COMMUNITY): Payer: Medicare Other

## 2019-10-13 ENCOUNTER — Ambulatory Visit (INDEPENDENT_AMBULATORY_CARE_PROVIDER_SITE_OTHER)
Admission: EM | Admit: 2019-10-13 | Discharge: 2019-10-13 | Disposition: A | Discharge status: Other Acute Inpatient Hospital | Payer: Medicare Other | Source: Home / Self Care

## 2019-10-13 ENCOUNTER — Inpatient Hospital Stay (HOSPITAL_COMMUNITY)
Admission: EM | Admit: 2019-10-13 | Discharge: 2019-10-18 | DRG: 641 | Disposition: A | Discharge status: Home / Self Care | Payer: Medicare Other | Attending: Internal Medicine | Admitting: Internal Medicine

## 2019-10-13 DIAGNOSIS — Z85828 Personal history of other malignant neoplasm of skin: Secondary | ICD-10-CM | POA: Diagnosis not present

## 2019-10-13 DIAGNOSIS — R112 Nausea with vomiting, unspecified: Secondary | ICD-10-CM

## 2019-10-13 DIAGNOSIS — R059 Cough, unspecified: Secondary | ICD-10-CM

## 2019-10-13 DIAGNOSIS — E785 Hyperlipidemia, unspecified: Secondary | ICD-10-CM | POA: Diagnosis present

## 2019-10-13 DIAGNOSIS — Z79890 Hormone replacement therapy (postmenopausal): Secondary | ICD-10-CM

## 2019-10-13 DIAGNOSIS — T368X5A Adverse effect of other systemic antibiotics, initial encounter: Secondary | ICD-10-CM | POA: Diagnosis present

## 2019-10-13 DIAGNOSIS — I10 Essential (primary) hypertension: Secondary | ICD-10-CM | POA: Diagnosis present

## 2019-10-13 DIAGNOSIS — Z7982 Long term (current) use of aspirin: Secondary | ICD-10-CM

## 2019-10-13 DIAGNOSIS — R109 Unspecified abdominal pain: Secondary | ICD-10-CM | POA: Diagnosis not present

## 2019-10-13 DIAGNOSIS — R05 Cough: Secondary | ICD-10-CM

## 2019-10-13 DIAGNOSIS — Z20828 Contact with and (suspected) exposure to other viral communicable diseases: Secondary | ICD-10-CM | POA: Diagnosis present

## 2019-10-13 DIAGNOSIS — E039 Hypothyroidism, unspecified: Secondary | ICD-10-CM | POA: Diagnosis present

## 2019-10-13 DIAGNOSIS — Z9049 Acquired absence of other specified parts of digestive tract: Secondary | ICD-10-CM | POA: Diagnosis not present

## 2019-10-13 DIAGNOSIS — Z8249 Family history of ischemic heart disease and other diseases of the circulatory system: Secondary | ICD-10-CM

## 2019-10-13 DIAGNOSIS — K5900 Constipation, unspecified: Secondary | ICD-10-CM

## 2019-10-13 DIAGNOSIS — Z79899 Other long term (current) drug therapy: Secondary | ICD-10-CM | POA: Diagnosis not present

## 2019-10-13 DIAGNOSIS — Z800 Family history of malignant neoplasm of digestive organs: Secondary | ICD-10-CM

## 2019-10-13 DIAGNOSIS — E119 Type 2 diabetes mellitus without complications: Secondary | ICD-10-CM | POA: Diagnosis present

## 2019-10-13 DIAGNOSIS — K589 Irritable bowel syndrome without diarrhea: Secondary | ICD-10-CM | POA: Diagnosis present

## 2019-10-13 DIAGNOSIS — Z888 Allergy status to other drugs, medicaments and biological substances status: Secondary | ICD-10-CM

## 2019-10-13 DIAGNOSIS — K219 Gastro-esophageal reflux disease without esophagitis: Secondary | ICD-10-CM | POA: Diagnosis present

## 2019-10-13 DIAGNOSIS — Z8719 Personal history of other diseases of the digestive system: Secondary | ICD-10-CM

## 2019-10-13 DIAGNOSIS — M1610 Unilateral primary osteoarthritis, unspecified hip: Secondary | ICD-10-CM | POA: Diagnosis present

## 2019-10-13 DIAGNOSIS — Z87440 Personal history of urinary (tract) infections: Secondary | ICD-10-CM | POA: Diagnosis not present

## 2019-10-13 DIAGNOSIS — Z881 Allergy status to other antibiotic agents status: Secondary | ICD-10-CM | POA: Diagnosis not present

## 2019-10-13 DIAGNOSIS — W19XXXA Unspecified fall, initial encounter: Secondary | ICD-10-CM

## 2019-10-13 DIAGNOSIS — E871 Hypo-osmolality and hyponatremia: Principal | ICD-10-CM | POA: Diagnosis present

## 2019-10-13 DIAGNOSIS — K588 Other irritable bowel syndrome: Secondary | ICD-10-CM | POA: Diagnosis not present

## 2019-10-13 LAB — POCT URINALYSIS DIP (DEVICE)
Glucose, UA: NEGATIVE mg/dL
Ketones, ur: 40 mg/dL — AB
Leukocytes,Ua: NEGATIVE
Nitrite: NEGATIVE
Protein, ur: NEGATIVE mg/dL
Specific Gravity, Urine: >=1.03 (ref 1.005–1.030)
Urobilinogen, UA: 0.2 mg/dL (ref 0.0–1.0)
pH: 5.5 (ref 5.0–8.0)

## 2019-10-13 LAB — COMPREHENSIVE METABOLIC PANEL
ALT: 20 U/L (ref 0–44)
AST: 34 U/L (ref 15–41)
Albumin: 4.5 g/dL (ref 3.5–5.0)
Alkaline Phosphatase: 76 U/L (ref 38–126)
Anion gap: 11 (ref 5–15)
BUN: 16 mg/dL (ref 8–23)
CO2: 20 mmol/L — ABNORMAL LOW (ref 22–32)
Calcium: 9.2 mg/dL (ref 8.9–10.3)
Chloride: 78 mmol/L — ABNORMAL LOW (ref 98–111)
Creatinine, Ser: 0.95 mg/dL (ref 0.44–1.00)
GFR calc Af Amer: >60 mL/min (ref >60)
GFR calc non Af Amer: 60 mL/min — ABNORMAL LOW (ref >60)
Glucose, Bld: 174 mg/dL — ABNORMAL HIGH (ref 70–99)
Potassium: 5.3 mmol/L — ABNORMAL HIGH (ref 3.5–5.1)
Sodium: 109 mmol/L — CL (ref 135–145)
Total Bilirubin: 1.4 mg/dL — ABNORMAL HIGH (ref 0.3–1.2)
Total Protein: 6.8 g/dL (ref 6.5–8.1)

## 2019-10-13 LAB — URINALYSIS, ROUTINE W REFLEX MICROSCOPIC
Bilirubin Urine: NEGATIVE
Glucose, UA: 150 mg/dL — AB
Ketones, ur: 20 mg/dL — AB
Leukocytes,Ua: NEGATIVE
Nitrite: NEGATIVE
Protein, ur: NEGATIVE mg/dL
Specific Gravity, Urine: 1.014 (ref 1.005–1.030)
pH: 6 (ref 5.0–8.0)

## 2019-10-13 LAB — CBC
HCT: 40.4 % (ref 36.0–46.0)
HCT: 39.2 % (ref 36.0–46.0)
Hemoglobin: 14.7 g/dL (ref 12.0–15.0)
Hemoglobin: 14.4 g/dL (ref 12.0–15.0)
MCH: 29.2 pg (ref 26.0–34.0)
MCH: 29.3 pg (ref 26.0–34.0)
MCHC: 36.4 g/dL — ABNORMAL HIGH (ref 30.0–36.0)
MCHC: 36.7 g/dL — ABNORMAL HIGH (ref 30.0–36.0)
MCV: 80.3 fL (ref 80.0–100.0)
MCV: 79.8 fL — ABNORMAL LOW (ref 80.0–100.0)
Platelets: 251 10*3/uL (ref 150–400)
Platelets: 236 10*3/uL (ref 150–400)
RBC: 5.03 MIL/uL (ref 3.87–5.11)
RBC: 4.91 MIL/uL (ref 3.87–5.11)
RDW: 12.2 % (ref 11.5–15.5)
RDW: 12.1 % (ref 11.5–15.5)
WBC: 8.4 10*3/uL (ref 4.0–10.5)
WBC: 7.7 10*3/uL (ref 4.0–10.5)
nRBC: 0 % (ref 0.0–0.2)
nRBC: 0 % (ref 0.0–0.2)

## 2019-10-13 LAB — BASIC METABOLIC PANEL
Anion gap: 11 (ref 5–15)
BUN: 13 mg/dL (ref 8–23)
CO2: 19 mmol/L — ABNORMAL LOW (ref 22–32)
Calcium: 8.8 mg/dL — ABNORMAL LOW (ref 8.9–10.3)
Chloride: 80 mmol/L — ABNORMAL LOW (ref 98–111)
Creatinine, Ser: 0.84 mg/dL (ref 0.44–1.00)
GFR calc Af Amer: >60 mL/min (ref >60)
GFR calc non Af Amer: >60 mL/min (ref >60)
Glucose, Bld: 117 mg/dL — ABNORMAL HIGH (ref 70–99)
Potassium: 4.7 mmol/L (ref 3.5–5.1)
Sodium: 110 mmol/L — CL (ref 135–145)

## 2019-10-13 LAB — OSMOLALITY, URINE: Osmolality, Ur: 407 mOsm/kg (ref 300–900)

## 2019-10-13 LAB — LIPASE, BLOOD: Lipase: 47 U/L (ref 11–51)

## 2019-10-13 LAB — SODIUM, URINE, RANDOM: Sodium, Ur: 88 mmol/L

## 2019-10-13 LAB — TSH: TSH: 1.282 u[IU]/mL (ref 0.350–4.500)

## 2019-10-13 LAB — OSMOLALITY: Osmolality: 235 mOsm/kg — CL (ref 275–295)

## 2019-10-13 MED ORDER — ASPIRIN 81 MG PO CHEW
81.0000 mg | CHEWABLE_TABLET | Freq: Every day | ORAL | Status: DC
  Administered 2019-10-13 – 2019-10-18 (×6): 81 mg via ORAL
  Filled 2019-10-13 (×6): qty 1

## 2019-10-13 MED ORDER — LEVOTHYROXINE SODIUM 50 MCG PO TABS
50.0000 ug | ORAL_TABLET | Freq: Every day | ORAL | Status: DC
  Administered 2019-10-14 – 2019-10-18 (×5): 50 ug via ORAL
  Filled 2019-10-13 (×5): qty 1

## 2019-10-13 MED ORDER — PANTOPRAZOLE SODIUM 40 MG PO TBEC
40.0000 mg | DELAYED_RELEASE_TABLET | Freq: Every day | ORAL | Status: DC
  Administered 2019-10-13 – 2019-10-18 (×6): 40 mg via ORAL
  Filled 2019-10-13 (×6): qty 1

## 2019-10-13 MED ORDER — ROSUVASTATIN CALCIUM 5 MG PO TABS
5.0000 mg | ORAL_TABLET | Freq: Every day | ORAL | Status: DC
  Administered 2019-10-13 – 2019-10-18 (×6): 5 mg via ORAL
  Filled 2019-10-13 (×6): qty 1

## 2019-10-13 MED ORDER — MAGNESIUM HYDROXIDE 400 MG/5ML PO SUSP: 30.0000 mL | Freq: Every day | ORAL | Status: DC | PRN

## 2019-10-13 MED ORDER — ENOXAPARIN SODIUM 40 MG/0.4ML ~~LOC~~ SOLN
40.0000 mg | SUBCUTANEOUS | Status: DC
  Administered 2019-10-13 – 2019-10-17 (×5): 40 mg via SUBCUTANEOUS
  Filled 2019-10-13 (×5): qty 0.4

## 2019-10-13 MED ORDER — ONDANSETRON HCL 4 MG/2ML IJ SOLN: 4.0000 mg | Freq: Four times a day (QID) | INTRAMUSCULAR | Status: DC | PRN

## 2019-10-13 MED ORDER — LACTATED RINGERS IV BOLUS
1000.0000 mL | Freq: Once | INTRAVENOUS | Status: AC
  Administered 2019-10-13: 1000 mL via INTRAVENOUS

## 2019-10-13 MED ORDER — FLUTICASONE PROPIONATE 50 MCG/ACT NA SUSP: 2.0000 | Freq: Every day | NASAL | Status: DC | PRN

## 2019-10-13 MED ORDER — ONDANSETRON HCL 4 MG PO TABS: 4.0000 mg | ORAL_TABLET | Freq: Four times a day (QID) | ORAL | Status: DC | PRN

## 2019-10-13 MED ORDER — SENNA 8.6 MG PO TABS
1.0000 | ORAL_TABLET | Freq: Two times a day (BID) | ORAL | Status: DC
  Administered 2019-10-13 – 2019-10-18 (×4): 8.6 mg via ORAL
  Filled 2019-10-13 (×10): qty 1

## 2019-10-13 MED ORDER — SODIUM CHLORIDE 0.9 % IV SOLN
INTRAVENOUS | Status: DC
  Administered 2019-10-13 – 2019-10-15 (×6): via INTRAVENOUS

## 2019-10-13 MED ORDER — LACTATED RINGERS IV SOLN
INTRAVENOUS | Status: AC
  Administered 2019-10-13: 21:00:00 via INTRAVENOUS

## 2019-10-13 MED ORDER — AMLODIPINE BESYLATE 5 MG PO TABS
5.0000 mg | ORAL_TABLET | Freq: Every day | ORAL | Status: DC
  Administered 2019-10-13 – 2019-10-18 (×6): 5 mg via ORAL
  Filled 2019-10-13 (×6): qty 1

## 2019-10-13 MED ORDER — LOSARTAN POTASSIUM 50 MG PO TABS
100.0000 mg | ORAL_TABLET | Freq: Every day | ORAL | Status: DC
  Administered 2019-10-13 – 2019-10-14 (×2): 100 mg via ORAL
  Filled 2019-10-13 (×2): qty 2

## 2019-10-13 MED ORDER — SODIUM CHLORIDE 0.9% FLUSH
3.0000 mL | Freq: Once | INTRAVENOUS | Status: AC
  Administered 2019-10-13: 3 mL via INTRAVENOUS

## 2019-10-13 NOTE — ED Notes (Signed)
Dr. Sherry Ruffing and Charge RN notified of Sodium 109. No orders received at this time.  Working on room for pt.

## 2019-10-13 NOTE — ED Notes (Signed)
Patient is being discharged from the Urgent [place] and sent to the Emergency Department via wheelchair by staff. Per Provider Peri Jefferson, patient is stable but in need of higher level of care due to fall within facilitt. Patient is aware and verbalizes understanding of plan of care.   Vitals:   10/13/19 1538  BP: 130/62  Pulse: (!) 56  Resp: 16  Temp: 98.2 F (36.8 C)  SpO2: 98%

## 2019-10-13 NOTE — Discharge Instructions (Signed)
Sent to ED, rule out bowel obstruction and further evaluation of fall.

## 2019-10-13 NOTE — ED Notes (Signed)
Pt assisted to restroom with RN to obtain urine sample; upon providing sample, assisted back to exam room with RN and placed in armed chair.

## 2019-10-13 NOTE — ED Provider Notes (Signed)
Anon Raices    CSN: [id number] Arrival date & time: 10/13/19  1448      History   Chief Complaint Chief Complaint  Patient presents with  . Emesis  . Generalized Body Aches    HPI DENASHA GETTIS is a 73 y.o. female.   Patient here concerned with vomiting x 4 days.  Notes vomiting 4 times last 24 hours, including once during evaluation.  Patient admits abdominal "discomfort" and constipation x 4 days.  She denies abdominal bloating, diarrhea, hematochezia, melena, hematemesis, CP, SOB, light headedness, dizziness.  She states she has Zofran but isn't taking it due to concerns about taking it with her heart condition (bradycardia due to unknown causes).  Denies sick contacts, no known exposures to [place].  She reports she had negative COVID test, results returned yesterday.  PSH cholecystectomy 25 - 30 years ago.     Past Medical History:  Diagnosis Date  . Acute meniscal tear of knee   . Basal cell carcinoma   . Colon polyp   . Diabetes ([place])   . Diverticulosis   . Edema   . Esophageal reflux   . Fibrocystic breast   . Hemorrhoids   . HTN (hypertension)   . Hyperlipidemia   . Hyponatremia   . Hypothyroidism   . OA (osteoarthritis) of hip   . Seasonal allergies   . Sinus bradycardia   . Syncope    vagal,  in the setting of GI cramping    Patient Active Problem List   Diagnosis Date Noted  . Bradycardia 06/03/2015  . Essential hypertension, benign 07/02/2014  . Elevated CPK 07/02/2014  . Abnormal EKG 07/02/2014  . Atypical chest pain 07/02/2014  . Obesity, unspecified 07/02/2014  . CONSTIPATION 08/04/2010  . DERMATOPHYTOSIS OF GROIN AND PERIANAL AREA 01/01/2010  . LOOSE STOOLS 01/01/2010  . FULL INCONTINENCE OF FECES 01/01/2010  . ABDOMINAL WALL PAIN 07/03/2009  . GERD 12/05/2008  . IRRITABLE BOWEL SYNDROME 12/05/2008  . CHEST PAIN 12/05/2008    Past Surgical History:  Procedure Laterality Date  . CHOLECYSTECTOMY    . TONSILLECTOMY    .  tubal ligation      OB History   No obstetric history on file.      Home Medications    Prior to Admission medications   Medication Sig Start Date End Date Taking? Authorizing Provider  amLODipine (NORVASC) 5 MG tablet Take 5 mg by mouth daily.   Yes [provider]  aspirin 81 MG tablet Take 81 mg by mouth daily.   Yes [provider]  esomeprazole (NEXIUM) 40 MG capsule Take 40 mg by mouth daily. 11/08/18  Yes [provider]  famotidine (PEPCID) 40 MG tablet Take 40 mg by mouth daily. 11/08/18  Yes [provider]  fluticasone (FLONASE) 50 MCG/ACT nasal spray Place 2 sprays into both nostrils as needed for allergies or rhinitis.   Yes [provider]  levothyroxine (SYNTHROID, LEVOTHROID) 50 MCG tablet Take 50 mcg by mouth daily before breakfast.   Yes [provider]  losartan (COZAAR) 100 MG tablet Take 100 mg by mouth daily.   Yes [provider]  rosuvastatin (CRESTOR) 5 MG tablet Take 5 mg by mouth daily.   Yes [provider]    Family History Family History  Problem Relation Age of Onset  . Colon cancer Mother   . Hypertension Mother   . Heart disease Mother   . Coronary artery disease Mother   . Pancreatic  cancer Father   . Other Brother        CABG    Social History Social History   Tobacco Use  . Smoking status: Never Smoker  . Smokeless tobacco: Never Used  Substance Use Topics  . Alcohol use: Not Currently  . Drug use: No     Allergies   Amoxicillin, Ciprofloxacin, Clarithromycin, Clindamycin/lincomycin, Hydrochlorothiazide, Lipitor [atorvastatin], Niaspan [niacin er], Triglide [fenofibrate], Welchol [colesevelam hcl], Zetia [ezetimibe], and Zostavax [zoster vaccine live]   Review of Systems Review of Systems  Constitutional: Negative for activity change, chills, fatigue and fever.  HENT: Negative for congestion, rhinorrhea and sore throat.   Respiratory: Negative for cough, chest  tightness, shortness of breath and wheezing.   Cardiovascular: Negative for chest pain and palpitations.  Gastrointestinal: Positive for abdominal pain, constipation, nausea and vomiting. Negative for abdominal distention and diarrhea.  Neurological: Negative for dizziness, syncope, weakness, light-headedness and headaches.  Hematological: Negative for adenopathy. Does not bruise/bleed easily.  Psychiatric/Behavioral: Negative for confusion, decreased concentration and sleep disturbance.     Physical Exam Triage Vital Signs ED Triage Vitals [10/13/19 1538]  Enc Vitals Group     BP 130/62     Pulse Rate (!) 56     Resp 16     Temp 98.2 F (36.8 C)     Temp Source Oral     SpO2 98 %     Weight      Height      Head Circumference      Peak Flow      Pain Score 5     Pain Loc      Pain Edu?      Excl. in [place]?    No data found.  Updated Vital Signs BP 133/69 (BP Location: Left Arm)   Pulse (!) 51   Temp 98.2 F (36.8 C) (Oral)   Resp 16   SpO2 99%   Visual Acuity Right Eye Distance:   Left Eye Distance:   Bilateral Distance:    Right Eye Near:   Left Eye Near:    Bilateral Near:     Physical Exam Vitals and nursing note reviewed.  Constitutional:      General: She is not in acute distress.    Appearance: Normal appearance. She is well-developed.  HENT:     Head: Normocephalic and atraumatic.     Nose: Nose normal.  Eyes:     General: No scleral icterus.    Extraocular Movements: Extraocular movements intact.     Conjunctiva/sclera: Conjunctivae normal.     Pupils: Pupils are equal, round, and reactive to light.  Cardiovascular:     Rate and Rhythm: Regular rhythm. Bradycardia present.     Heart sounds: No murmur.  Pulmonary:     Effort: Pulmonary effort is normal. No respiratory distress.     Breath sounds: Normal breath sounds. No wheezing, rhonchi or rales.  Abdominal:     General: There is distension.     Palpations: Abdomen is soft.     Tenderness:  There is generalized abdominal tenderness and tenderness in the right upper quadrant and epigastric area. There is no right CVA tenderness, left CVA tenderness, guarding or rebound.  Musculoskeletal:        General: Normal range of motion.     Cervical back: Neck supple.  Skin:    General: Skin is warm and dry.     Capillary Refill: Capillary refill takes less than 2 seconds.  Neurological:  General: No focal deficit present.     Mental Status: She is alert and oriented to person, place, and time.  Psychiatric:        Mood and Affect: Mood normal.        Behavior: Behavior normal.      UC Treatments / Results  Labs (all labs ordered are listed, but only abnormal results are displayed) Labs Reviewed  POCT URINALYSIS DIP (DEVICE) - Abnormal; Notable for the following components:      Result Value   Bilirubin Urine SMALL (*)    Ketones, ur 40 (*)    Hgb urine dipstick TRACE (*)    All other components within normal limits    EKG   Radiology No results found.  Procedures Procedures (including critical care time)  Medications Ordered in UC Medications - No data to display  Initial Impression / Assessment and Plan / UC Course  I have reviewed the triage vital signs and the nursing notes.  Pertinent labs & imaging results that were available during my care of the patient were reviewed by me and considered in my medical decision making (see chart for details).     Patient sent to ED for abdominal pain, constipation and vomiting, rule out bowel bbstruction.  After I left the exam room, a loud "crash" sound was heard through the exam room wall.  I immediately returned to the room and found the patient getting up from the exam room floor.  She states she was ok and just fell, hitting her L side of her body on the ground.  She denies head injury, LOC, dizziness, CP.  She states her emesis bag fell out of her hand and she attempted to reach for it, causing her to fall.  She  states she is "fine".  Patient was transferred to the ED for further evaluation.  Final Clinical Impressions(s) / UC Diagnoses   Final diagnoses:  None   Discharge Instructions   None    ED Prescriptions    None     PDMP not reviewed this encounter.   Peri Jefferson, PA-C 10/13/19 1846

## 2019-10-13 NOTE — H&P (Signed)
PCP:   Shirline Frees, MD   Code status: Full Code  Chief Complaint: Nausea vomiting   HPI: this is a 73 y/o female who presents with c/o simply not feeling well.  Per patient she has been throwing up the last 4 days.  She additionally reports constipation, headache, fatigue, chronic cold, and myalagia, all during the last 4 days. She denies any confusion.  She denies any fevers or chills.    She had a UTI treated with bactrim, UTI remained, she was retreated with bactrim. She has not completed the second round of antibiotics d/t her n/v. She denies actual abdominal pain stating it is uncomfortable. She denies  any BOU.   She came to the ER because she was feeling very weak. History provided by the patient and her husband who is present at bedside.  In the ER she was found to be hyponatremic with a sodium of 109.  The hospitalist have been asked to admit. She does have a history of hyponatremia, but her sodium has never been this low.   Review of Systems:  The patient denies anorexia, nausea, vomiting, myalgia, headache,  fever, weight loss,, vision loss, decreased hearing, hoarseness, chest pain, syncope, dyspnea on exertion, peripheral edema, balance deficits, hemoptysis, abdominal pain, melena, hematochezia, severe indigestion/heartburn, hematuria, incontinence, genital sores, muscle weakness, suspicious skin lesions, transient blindness, difficulty walking, depression, unusual weight change, abnormal bleeding, enlarged lymph nodes, angioedema, and breast masses.  Past Medical History: Past Medical History:  Diagnosis Date  . Acute meniscal tear of knee   . Basal cell carcinoma   . Colon polyp   . Diabetes ([place])   . Diverticulosis   . Edema   . Esophageal reflux   . Fibrocystic breast   . Hemorrhoids   . HTN (hypertension)   . Hyperlipidemia   . Hyponatremia   . Hypothyroidism   . OA (osteoarthritis) of hip   . Seasonal allergies   . Sinus bradycardia   . Syncope    vagal,   in the setting of GI cramping   Past Surgical History:  Procedure Laterality Date  . CHOLECYSTECTOMY    . TONSILLECTOMY    . tubal ligation      Medications: Prior to Admission medications   Medication Sig Start Date End Date Taking? Authorizing Provider  amLODipine (NORVASC) 5 MG tablet Take 5 mg by mouth daily.    [provider]  aspirin 81 MG tablet Take 81 mg by mouth daily.    [provider]  esomeprazole (NEXIUM) 40 MG capsule Take 40 mg by mouth daily. 11/08/18   [provider]  famotidine (PEPCID) 40 MG tablet Take 40 mg by mouth daily. 11/08/18   [provider]  fluticasone (FLONASE) 50 MCG/ACT nasal spray Place 2 sprays into both nostrils as needed for allergies or rhinitis.    [provider]  levothyroxine (SYNTHROID, LEVOTHROID) 50 MCG tablet Take 50 mcg by mouth daily before breakfast.    [provider]  losartan (COZAAR) 100 MG tablet Take 100 mg by mouth daily.    [provider]  rosuvastatin (CRESTOR) 5 MG tablet Take 5 mg by mouth daily.    [provider]    Allergies:   Allergies  Allergen Reactions  . Amoxicillin     REACTION: Rash  . Ciprofloxacin     myalgias  . Clarithromycin     REACTION: Nausea and vomiting  . Clindamycin/Lincomycin     Rash  . Hydrochlorothiazide  REACTION: Decreased Potassium and decreased Sodium  . Lipitor [Atorvastatin] Other (See Comments)    CPK  . Niaspan [Niacin Er] Other (See Comments)    CPK  . Triglide [Fenofibrate] Other (See Comments)    CPK  . Welchol [Colesevelam Hcl] Other (See Comments)    CPK  . Zetia [Ezetimibe] Other (See Comments)    CPK   . Zostavax [Zoster Vaccine Live]     Social History:  reports that she has never smoked. She has never used smokeless tobacco. She reports previous alcohol use. She reports that she does not use drugs.  Family History: Family History  Problem Relation Age of Onset  . Colon cancer Mother    . Hypertension Mother   . Heart disease Mother   . Coronary artery disease Mother   . Pancreatic cancer Father   . Other Brother        CABG    Physical Exam: Vitals:   10/13/19 1915 10/13/19 1930 10/13/19 1945 10/13/19 2000  BP: (!) 138/56 137/60 (!) 148/68 (!) 147/63  Pulse: (!) 55 (!) 59 (!) 58 61  Resp: 18 19 16 17   Temp:      TempSrc:      SpO2: 99% 99% 98% 98%    General:  Alert and oriented times three, well developed and nourished, no acute distress, weak appearing Eyes: PERRLA, pink conjunctiva, no scleral icterus ENT: Moist oral mucosa, neck supple, no thyromegaly Lungs: clear to ascultation, no wheeze, no crackles, no use of accessory muscles Cardiovascular: regular rate and rhythm, no regurgitation, no gallops, no murmurs. No carotid bruits, no JVD Abdomen: soft, positive BS, non-tender, non-distended, no organomegaly, not an acute abdomen GU: not examined Neuro: CN II - XII grossly intact, sensation intact Musculoskeletal: strength 5/5 all extremities, no clubbing, cyanosis or edema Skin: no rash, no subcutaneous crepitation, no decubitus Psych: appropriate patient   Labs on Admission:  Recent Labs    10/13/19 1704  NA 109*  K 5.3*  CL 78*  CO2 20*  GLUCOSE 174*  BUN 16  CREATININE 0.95  CALCIUM 9.2   Recent Labs    10/13/19 1704  AST 34  ALT 20  ALKPHOS 76  BILITOT 1.4*  PROT 6.8  ALBUMIN 4.5   Recent Labs    10/13/19 1704  LIPASE 47   Recent Labs    10/13/19 1704  WBC 8.4  HGB 14.7  HCT 40.4  MCV 80.3  PLT 251   No results for input(s): CKTOTAL, CKMB, CKMBINDEX, TROPONINI in the last 72 hours. Invalid input(s): POCBNP No results for input(s): DDIMER in the last 72 hours. No results for input(s): HGBA1C in the last 72 hours. No results for input(s): CHOL, HDL, LDLCALC, TRIG, CHOLHDL, LDLDIRECT in the last 72 hours. No results for input(s): TSH, T4TOTAL, T3FREE, THYROIDAB in the last 72 hours.  Invalid input(s): FREET3 No  results for input(s): VITAMINB12, FOLATE, FERRITIN, TIBC, IRON, RETICCTPCT in the last 72 hours.  Micro Results: No results found for this or any previous visit (from the past 240 hour(s)).   Radiological Exams on Admission: CT ABDOMEN PELVIS WO CONTRAST  Result Date: 10/13/2019 CLINICAL DATA:  Intermittent vomiting over the past 4 days. Pain in all extremities and in the lower abdomen. Cough. EXAM: CT ABDOMEN AND PELVIS WITHOUT CONTRAST TECHNIQUE: Multidetector CT imaging of the abdomen and pelvis was performed following the standard protocol without IV contrast. COMPARISON:  2/0 4/9 FINDINGS: Lower chest: Clear lung bases.  Heart normal size. Hepatobiliary:  No focal liver abnormality is seen. Status post cholecystectomy. No biliary dilatation. Pancreas: Unremarkable. No pancreatic ductal dilatation or surrounding inflammatory changes. Spleen: Normal in size without focal abnormality. Adrenals/Urinary Tract: No adrenal masses. Kidneys normal in overall size. No renal masses, stones or hydronephrosis. Mild chronic perinephric stranding. Ureters are normal in course and in caliber. No ureteral stones. Normal bladder. Stomach/Bowel: Small hiatal hernia. Stomach otherwise unremarkable. Small bowel and colon are normal in caliber. No wall thickening. No inflammation. Normal appendix visualized. Vascular/Lymphatic: Minor aortic atherosclerotic calcifications. No aneurysm. No enlarged lymph nodes. Reproductive: Uterus and bilateral adnexa are unremarkable. Other: No abdominal wall hernia or abnormality. No abdominopelvic ascites. Musculoskeletal: No fracture or acute finding. No osteoblastic or osteolytic lesions. IMPRESSION: 1. No acute findings within the abdomen or pelvis. 2. Small hiatal hernia. 3. Mild aortic atherosclerosis. Electronically Signed   By: Lajean Manes M.D.   On: 10/13/2019 20:37   CT Head Wo Contrast  Result Date: 10/13/2019 CLINICAL DATA:  Intermittent vomiting over the past 4 days.  Headache. EXAM: CT HEAD WITHOUT CONTRAST TECHNIQUE: Contiguous axial images were obtained from the base of the skull through the vertex without intravenous contrast. COMPARISON:  None. FINDINGS: Brain: No evidence of acute infarction, hemorrhage, hydrocephalus, extra-axial collection or mass lesion/mass effect. Vascular: No hyperdense vessel or unexpected calcification. Skull: Normal. Negative for fracture or focal lesion. Sinuses/Orbits: Globes and orbits are unremarkable. Sinuses and mastoid air cells are clear. Other: None. IMPRESSION: Normal unenhanced CT scan of the brain. Electronically Signed   By: Lajean Manes M.D.   On: 10/13/2019 20:32    Assessment/Plan Present on Admission: . Hyponatremia -Admit to stepdown -Urine sodium, plasma sodium osmolality ordered as well as TSH -Patient received 1 L lactated Ringer's in the ER as well as 3% saline injection. -Serial BMPs for now every 2 hours x3 sets -Unclear etiology of patient's hyponatremia, work-up underway  . Essential hypertension, benign -Stable, home meds resumed  GERD -Stable, home meds resumed  Hypothyroidism -TSH ordered, home meds resumed  . Irritable bowel syndrome -Aware   [name] 10/13/2019, 9:01 PM

## 2019-10-13 NOTE — ED Triage Notes (Addendum)
C/O vomiting intermittently over past 4 days with pain in all extremities and lower abd, along with HA and cough.  States had negative Covid test yesterday following a clinic televisit.  States not able to take Rx antiemetic due to nausea.  Has been able to keep down some PO fluids.  Reports constipation over past 4 days; was able to have only small BM this AM.

## 2019-10-13 NOTE — ED Notes (Signed)
Pt had a non witnessed fall from tabletop while waiting for provider to return with test results; Pt stated she was sitting on tabletop and her emesis bag fell from the side table and she reached to the floor to get and tumbled over off of the tabletop.  No head injury or LOC; pt states she hit the side of her left leg against side table but it only hurt a little according to her.  Fall was non witnessed but myself Therapist, art [place], and providers Peri Jefferson and Augusto Gamble heard the fall)

## 2019-10-13 NOTE — ED Triage Notes (Signed)
C/o vomiting, constipation, body aches, and headache since Wednesday.  States she bent over to pick up something she dropped today and got off balance.  Denies dizziness.  States since fall she has R flank pain.  Reports negative COVID test yesterday.  Sent from [facility].

## 2019-10-13 NOTE — ED Notes (Signed)
Pt able to ambulate to and from bathroom without assistance

## 2019-10-13 NOTE — ED Provider Notes (Signed)
[facility] EMERGENCY DEPARTMENT Provider Note   CSN: [id number] Arrival date & time: 10/13/19  1630     History Chief Complaint  Patient presents with  . Emesis  . Constipation  . Fall    [name] is a 73 y.o. female.  HPI     73 year old female with history of hypertension, hyperlipidemia hyponatremia comes in a chief complaint of vomiting, constipation and fall.  She reports that prior to ED arrival she had a fall.  She has been feeling unwell for the last 5 days.  She has been constipated, with small BMs and having nausea with vomiting.  She had emesis x4-5 times today.  She is also having episodes of dizziness and her balance is slightly worse.  She is also having body cramping.  Patient denies any sick exposures. She denies any cough, fevers, chills.  Past Medical History:  Diagnosis Date  . Acute meniscal tear of knee   . Basal cell carcinoma   . Colon polyp   . Diabetes ([place])   . Diverticulosis   . Edema   . Esophageal reflux   . Fibrocystic breast   . Hemorrhoids   . HTN (hypertension)   . Hyperlipidemia   . Hyponatremia   . Hypothyroidism   . OA (osteoarthritis) of hip   . Seasonal allergies   . Sinus bradycardia   . Syncope    vagal,  in the setting of GI cramping    Patient Active Problem List   Diagnosis Date Noted  . Bradycardia 06/03/2015  . Essential hypertension, benign 07/02/2014  . Elevated CPK 07/02/2014  . Abnormal EKG 07/02/2014  . Atypical chest pain 07/02/2014  . Obesity, unspecified 07/02/2014  . CONSTIPATION 08/04/2010  . DERMATOPHYTOSIS OF GROIN AND PERIANAL AREA 01/01/2010  . LOOSE STOOLS 01/01/2010  . FULL INCONTINENCE OF FECES 01/01/2010  . ABDOMINAL WALL PAIN 07/03/2009  . GERD 12/05/2008  . IRRITABLE BOWEL SYNDROME 12/05/2008  . CHEST PAIN 12/05/2008    Past Surgical History:  Procedure Laterality Date  . CHOLECYSTECTOMY    . TONSILLECTOMY    . tubal ligation       OB History   No  obstetric history on file.     Family History  Problem Relation Age of Onset  . Colon cancer Mother   . Hypertension Mother   . Heart disease Mother   . Coronary artery disease Mother   . Pancreatic cancer Father   . Other Brother        CABG    Social History   Tobacco Use  . Smoking status: Never Smoker  . Smokeless tobacco: Never Used  Substance Use Topics  . Alcohol use: Not Currently  . Drug use: No    Home Medications Prior to Admission medications   Medication Sig Start Date End Date Taking? Authorizing Provider  amLODipine (NORVASC) 5 MG tablet Take 5 mg by mouth daily.    [provider]  aspirin 81 MG tablet Take 81 mg by mouth daily.    [provider]  esomeprazole (NEXIUM) 40 MG capsule Take 40 mg by mouth daily. 11/08/18   [provider]  famotidine (PEPCID) 40 MG tablet Take 40 mg by mouth daily. 11/08/18   [provider]  fluticasone (FLONASE) 50 MCG/ACT nasal spray Place 2 sprays into both nostrils as needed for allergies or rhinitis.    [provider]  levothyroxine (SYNTHROID, LEVOTHROID) 50 MCG tablet Take 50 mcg by mouth daily before  breakfast.    [provider]  losartan (COZAAR) 100 MG tablet Take 100 mg by mouth daily.    [provider]  rosuvastatin (CRESTOR) 5 MG tablet Take 5 mg by mouth daily.    [provider]    Allergies    Amoxicillin, Ciprofloxacin, Clarithromycin, Clindamycin/lincomycin, Hydrochlorothiazide, Lipitor [atorvastatin], Niaspan [niacin er], Triglide [fenofibrate], Welchol [colesevelam hcl], Zetia [ezetimibe], and Zostavax [zoster vaccine live]  Review of Systems   Review of Systems  Constitutional: Positive for activity change and fatigue.  Gastrointestinal: Positive for constipation.  Musculoskeletal: Positive for myalgias.  Neurological: Positive for light-headedness and headaches.  All other systems reviewed and are negative.   Physical  Exam Updated Vital Signs BP (!) 147/63   Pulse 61   Temp 98.1 F (36.7 C) (Oral)   Resp 17   SpO2 98%   Physical Exam Vitals and nursing note reviewed.  Constitutional:      Appearance: She is well-developed.  HENT:     Head: Normocephalic and atraumatic.  Eyes:     Extraocular Movements: Extraocular movements intact.     Pupils: Pupils are equal, round, and reactive to light.  Cardiovascular:     Rate and Rhythm: Normal rate.  Pulmonary:     Effort: Pulmonary effort is normal.  Abdominal:     General: Bowel sounds are normal.  Musculoskeletal:     Cervical back: Normal range of motion and neck supple.  Skin:    General: Skin is warm and dry.  Neurological:     Mental Status: She is alert and oriented to person, place, and time.     Cranial Nerves: No cranial nerve deficit.     Sensory: No sensory deficit.     Motor: No weakness.     Coordination: Coordination normal.     Gait: Gait normal.     Deep Tendon Reflexes: Reflexes normal.     ED Results / Procedures / Treatments   Labs (all labs ordered are listed, but only abnormal results are displayed) Labs Reviewed  COMPREHENSIVE METABOLIC PANEL - Abnormal; Notable for the following components:      Result Value   Sodium 109 (*)    Potassium 5.3 (*)    Chloride 78 (*)    CO2 20 (*)    Glucose, Bld 174 (*)    Total Bilirubin 1.4 (*)    GFR calc non Af Amer 60 (*)    All other components within normal limits  CBC - Abnormal; Notable for the following components:   MCHC 36.4 (*)    All other components within normal limits  SARS CORONAVIRUS 2 (TAT 6-24 HRS)  LIPASE, BLOOD  URINALYSIS, ROUTINE W REFLEX MICROSCOPIC    EKG EKG Interpretation  Date/Time:  Saturday October 13 2019 18:46:02 EST Ventricular Rate:  52 PR Interval:    QRS Duration: 115 QT Interval:  474 QTC Calculation: 441 R Axis:   -93 Text Interpretation: Sinus rhythm Consider left atrial enlargement Left anterior fascicular block  Nonspecific T abnrm, anterolateral leads No acute changes TWI are not new Confirmed by Varney Biles [phone]) on 10/13/2019 7:00:50 PM   Radiology CT ABDOMEN PELVIS WO CONTRAST  Result Date: 10/13/2019 CLINICAL DATA:  Intermittent vomiting over the past 4 days. Pain in all extremities and in the lower abdomen. Cough. EXAM: CT ABDOMEN AND PELVIS WITHOUT CONTRAST TECHNIQUE: Multidetector CT imaging of the abdomen and pelvis was performed following the standard protocol without IV contrast. COMPARISON:  2/0 4/9 FINDINGS: Lower chest:  Clear lung bases.  Heart normal size. Hepatobiliary: No focal liver abnormality is seen. Status post cholecystectomy. No biliary dilatation. Pancreas: Unremarkable. No pancreatic ductal dilatation or surrounding inflammatory changes. Spleen: Normal in size without focal abnormality. Adrenals/Urinary Tract: No adrenal masses. Kidneys normal in overall size. No renal masses, stones or hydronephrosis. Mild chronic perinephric stranding. Ureters are normal in course and in caliber. No ureteral stones. Normal bladder. Stomach/Bowel: Small hiatal hernia. Stomach otherwise unremarkable. Small bowel and colon are normal in caliber. No wall thickening. No inflammation. Normal appendix visualized. Vascular/Lymphatic: Minor aortic atherosclerotic calcifications. No aneurysm. No enlarged lymph nodes. Reproductive: Uterus and bilateral adnexa are unremarkable. Other: No abdominal wall hernia or abnormality. No abdominopelvic ascites. Musculoskeletal: No fracture or acute finding. No osteoblastic or osteolytic lesions. IMPRESSION: 1. No acute findings within the abdomen or pelvis. 2. Small hiatal hernia. 3. Mild aortic atherosclerosis. Electronically Signed   By: Lajean Manes M.D.   On: 10/13/2019 20:37   CT Head Wo Contrast  Result Date: 10/13/2019 CLINICAL DATA:  Intermittent vomiting over the past 4 days. Headache. EXAM: CT HEAD WITHOUT CONTRAST TECHNIQUE: Contiguous axial images were  obtained from the base of the skull through the vertex without intravenous contrast. COMPARISON:  None. FINDINGS: Brain: No evidence of acute infarction, hemorrhage, hydrocephalus, extra-axial collection or mass lesion/mass effect. Vascular: No hyperdense vessel or unexpected calcification. Skull: Normal. Negative for fracture or focal lesion. Sinuses/Orbits: Globes and orbits are unremarkable. Sinuses and mastoid air cells are clear. Other: None. IMPRESSION: Normal unenhanced CT scan of the brain. Electronically Signed   By: Lajean Manes M.D.   On: 10/13/2019 20:32    Procedures .Critical Care Performed by: Varney Biles, MD Authorized by: Varney Biles, MD   Critical care provider statement:    Critical care time (minutes):  38   Critical care was necessary to treat or prevent imminent or life-threatening deterioration of the following conditions:  Metabolic crisis   Critical care was time spent personally by me on the following activities:  Discussions with consultants, evaluation of patient's response to treatment, examination of patient, ordering and performing treatments and interventions, ordering and review of laboratory studies, ordering and review of radiographic studies, pulse oximetry, re-evaluation of patient's condition, obtaining history from patient or surrogate and review of old charts   (including critical care time)  Medications Ordered in ED Medications  lactated ringers infusion (has no administration in time range)  sodium chloride flush (NS) 0.9 % injection 3 mL (3 mLs Intravenous Given 10/13/19 1956)  lactated ringers bolus 1,000 mL (1,000 mLs Intravenous New Bag/Given 10/13/19 1955)    ED Course  I have reviewed the triage vital signs and the nursing notes.  Pertinent labs & imaging results that were available during my care of the patient were reviewed by me and considered in my medical decision making (see chart for details).    MDM Rules/Calculators/A&P         REAH VARRONE was evaluated in Emergency Department on 10/13/2019 for the symptoms described in the history of present illness. She was evaluated in the context of the global COVID-19 pandemic, which necessitated consideration that the patient might be at risk for infection with the SARS-CoV-2 virus that causes COVID-19. Institutional protocols and algorithms that pertain to the evaluation of patients at risk for COVID-19 are in a state of rapid change based on information released by regulatory bodies including the CDC and federal and state organizations. These policies and algorithms were followed during the patient's  care in the ED.   73 year old female comes in with multiple complaints.  She is having headaches, myalgias, dizziness, constipation, nausea, vomiting.  We considered COVID-19 in the differential diagnosis and the point-of-care test is negative.  She also has history of hypertension, hyperlipidemia and hyponatremia.  Labs revealed profound hyponatremia with sodium at 109.  It is unclear why she has sodium this low.  Allegedly she has hyponatremia history but never has she required admission for it.  Patient denies any new medications.  With her history of headaches, we will ensure there is no tumor.  She has history of skin cancer which is now in remission.  Additionally she is having constipation, nausea, vomiting.  I recommend some of the symptoms could be because of hyponatremia but since she is getting CT scan of her brain we will also get CT abdomen pelvis without contrast.  She will need admission to the hospital for optimization.  Given her nausea, vomiting and reduced p.o. intake she is hypovolemic and we will give her a liter of LR.  Final Clinical Impression(s) / ED Diagnoses Final diagnoses:  Acute hyponatremia    Rx / DC Orders ED Discharge Orders    None       Varney Biles, MD 10/13/19 2047

## 2019-10-14 ENCOUNTER — Inpatient Hospital Stay (HOSPITAL_COMMUNITY): Payer: Medicare Other

## 2019-10-14 DIAGNOSIS — E785 Hyperlipidemia, unspecified: Secondary | ICD-10-CM

## 2019-10-14 DIAGNOSIS — I10 Essential (primary) hypertension: Secondary | ICD-10-CM

## 2019-10-14 DIAGNOSIS — K588 Other irritable bowel syndrome: Secondary | ICD-10-CM

## 2019-10-14 LAB — BASIC METABOLIC PANEL
Anion gap: 12 (ref 5–15)
Anion gap: 9 (ref 5–15)
Anion gap: 9 (ref 5–15)
Anion gap: 10 (ref 5–15)
BUN: 10 mg/dL (ref 8–23)
BUN: 9 mg/dL (ref 8–23)
BUN: 11 mg/dL (ref 8–23)
BUN: 10 mg/dL (ref 8–23)
CO2: 19 mmol/L — ABNORMAL LOW (ref 22–32)
CO2: 18 mmol/L — ABNORMAL LOW (ref 22–32)
CO2: 20 mmol/L — ABNORMAL LOW (ref 22–32)
CO2: 17 mmol/L — ABNORMAL LOW (ref 22–32)
Calcium: 8.7 mg/dL — ABNORMAL LOW (ref 8.9–10.3)
Calcium: 8.5 mg/dL — ABNORMAL LOW (ref 8.9–10.3)
Calcium: 8.5 mg/dL — ABNORMAL LOW (ref 8.9–10.3)
Calcium: 8.2 mg/dL — ABNORMAL LOW (ref 8.9–10.3)
Chloride: 82 mmol/L — ABNORMAL LOW (ref 98–111)
Chloride: 87 mmol/L — ABNORMAL LOW (ref 98–111)
Chloride: 89 mmol/L — ABNORMAL LOW (ref 98–111)
Chloride: 88 mmol/L — ABNORMAL LOW (ref 98–111)
Creatinine, Ser: 0.75 mg/dL (ref 0.44–1.00)
Creatinine, Ser: 0.7 mg/dL (ref 0.44–1.00)
Creatinine, Ser: 0.94 mg/dL (ref 0.44–1.00)
Creatinine, Ser: 0.82 mg/dL (ref 0.44–1.00)
GFR calc Af Amer: >60 mL/min (ref >60)
GFR calc Af Amer: >60 mL/min (ref >60)
GFR calc Af Amer: >60 mL/min (ref >60)
GFR calc Af Amer: >60 mL/min (ref >60)
GFR calc non Af Amer: >60 mL/min (ref >60)
GFR calc non Af Amer: >60 mL/min (ref >60)
GFR calc non Af Amer: >60 mL/min (ref >60)
GFR calc non Af Amer: >60 mL/min (ref >60)
Glucose, Bld: 101 mg/dL — ABNORMAL HIGH (ref 70–99)
Glucose, Bld: 133 mg/dL — ABNORMAL HIGH (ref 70–99)
Glucose, Bld: 112 mg/dL — ABNORMAL HIGH (ref 70–99)
Glucose, Bld: 138 mg/dL — ABNORMAL HIGH (ref 70–99)
Potassium: 4.8 mmol/L (ref 3.5–5.1)
Potassium: 3.7 mmol/L (ref 3.5–5.1)
Potassium: 4.3 mmol/L (ref 3.5–5.1)
Potassium: 4.1 mmol/L (ref 3.5–5.1)
Sodium: 113 mmol/L — CL (ref 135–145)
Sodium: 114 mmol/L — CL (ref 135–145)
Sodium: 118 mmol/L — CL (ref 135–145)
Sodium: 115 mmol/L — CL (ref 135–145)

## 2019-10-14 LAB — CBC
HCT: 38.1 % (ref 36.0–46.0)
Hemoglobin: 14.1 g/dL (ref 12.0–15.0)
MCH: 29.4 pg (ref 26.0–34.0)
MCHC: 37 g/dL — ABNORMAL HIGH (ref 30.0–36.0)
MCV: 79.4 fL — ABNORMAL LOW (ref 80.0–100.0)
Platelets: 223 10*3/uL (ref 150–400)
RBC: 4.8 MIL/uL (ref 3.87–5.11)
RDW: 12 % (ref 11.5–15.5)
WBC: 8.1 10*3/uL (ref 4.0–10.5)
nRBC: 0 % (ref 0.0–0.2)

## 2019-10-14 LAB — SARS CORONAVIRUS 2 (TAT 6-24 HRS): SARS Coronavirus 2: NEGATIVE

## 2019-10-14 NOTE — Progress Notes (Signed)
[facility] PROGRESS NOTE  [name] [id number] DOB: 03/25/1946 DOA: 10/13/2019 PCP: Shirline Frees, MD  Brief Summary: HPI: this is a 73 y/o female who presents with c/o simply not feeling well.  Per patient she has been throwing up the last 4 days.  She additionally reports constipation, headache, fatigue, chronic cold, and myalagia, all during the last 4 days. She denies any confusion.  She denies any fevers or chills.    She had a UTI treated with bactrim, UTI remained, she was retreated with bactrim. She has not completed the second round of antibiotics d/t her n/v. She denies actual abdominal pain stating it is uncomfortable. She denies  any BOU.   She came to the ER because she was feeling very weak. History provided by the patient and her husband who is present at bedside.  In the ER she was found to be hyponatremic with a sodium of 109.  The hospitalist have been asked to admit. She does have a history of hyponatremia, but her sodium has never been this low.  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  HTN  Controlled  GERD  PPI  Hypothyroidism  On Synthroid  IBS  No complaints  Code Status: Full code Family Communication: None Disposition Plan: home   Consultants:  none  Procedures:  None  Antibiotics:  None  HPI/Subjective: Patient feeling much better.  Headache has resolved.  No dizziness, lightheadedness, chest pain, shortness of breath.  Does have slight cough.  Only new medication was Bactrim for UTI that she was on for 17 days  Objective: Vitals:   10/14/19  0248 10/14/19 0831  BP: 131/74 (!) 120/53  Pulse: 60 (!) 57  Resp: 17 18  Temp: 98 F (36.7 C) 98.4 F (36.9 C)  SpO2:  100%    Intake/Output Summary (Last 24 hours) at 10/14/2019 1025 Last data filed at 10/14/2019 0845 Gross per 24 hour  Intake 1507.03 ml  Output 1260 ml  Net 247.03 ml   Filed Weights   10/14/19 0248  Weight: 81.5 kg    Exam:   General: Alert and oriented x3.  No acute distress.  Cardiovascular: Regular rate, no murmurs  Respiratory: Clear to auscultation bilaterally with no wheezes, rales, rhonchi  Abdomen: Soft, nontender, nondistended.  Appropriate bowel sounds.  No hepatosplenomegaly  Musculoskeletal: Major joints nonerythematous.  No contractures.  Moves all 4 extremities without difficulty.  Extremity: No edema.  Extremities warm to touch with 2+ dorsalis pedis and radial pulses.  Data Reviewed: Basic Metabolic Panel: Recent Labs  Lab 10/13/19 1704 10/13/19 2127 10/14/19 0252  NA 109* 110* 113*  K 5.3* 4.7 4.8  CL 78* 80* 82*  CO2 20* 19* 19*  GLUCOSE 174* 117* 101*  BUN 16 13 10   CREATININE 0.95 0.84 0.75  CALCIUM 9.2 8.8* 8.7*   Liver Function Tests: Recent Labs  Lab 10/13/19 1704  AST 34  ALT 20  ALKPHOS 76  BILITOT 1.4*  PROT 6.8  ALBUMIN 4.5   Recent Labs  Lab 10/13/19 1704  LIPASE 47   No results for input(s): AMMONIA in the  last 168 hours. CBC: Recent Labs  Lab 10/13/19 1704 10/13/19 2149 10/14/19 0252  WBC 8.4 7.7 8.1  HGB 14.7 14.4 14.1  HCT 40.4 39.2 38.1  MCV 80.3 79.8* 79.4*  PLT 251 236 223   Cardiac Enzymes: No results for input(s): CKTOTAL, CKMB, CKMBINDEX, TROPONINI in the last 168 hours. BNP (last 3 results) No results for input(s): BNP in the last 8760 hours.  ProBNP (last 3 results) No results for input(s): PROBNP in the last 8760 hours.  CBG: No results for input(s): GLUCAP in the last 168 hours.  Recent Results (from the past 240 hour(s))  SARS CORONAVIRUS 2 (TAT 6-24 HRS)  Nasopharyngeal Nasopharyngeal Swab     Status: None   Collection Time: 10/13/19  7:43 PM   Specimen: Nasopharyngeal Swab  Result Value Ref Range Status   SARS Coronavirus 2 NEGATIVE NEGATIVE Final    Comment: (NOTE) SARS-CoV-2 target nucleic acids are NOT DETECTED. The SARS-CoV-2 RNA is generally detectable in upper and lower respiratory specimens during the acute phase of infection. Negative results do not preclude SARS-CoV-2 infection, do not rule out co-infections with other pathogens, and should not be used as the sole basis for treatment or other patient management decisions. Negative results must be combined with clinical observations, patient history, and epidemiological information. The expected result is Negative. Fact Sheet for Patients: SugarRoll.be Fact Sheet for Healthcare Providers: https://www.woods-mathews.com/ This test is not yet approved or cleared by the [place] FDA and  has been authorized for detection and/or diagnosis of SARS-CoV-2 by FDA under an Emergency Use Authorization (EUA). This EUA will remain  in effect (meaning this test can be used) for the duration of the COVID-19 declaration under Section 56 4(b)(1) of the Act, 21 U.S.C. section 360bbb-3(b)(1), unless the authorization is terminated or revoked sooner. Performed at [facility], [place] [address], [place],  [address]      Studies: CT ABDOMEN PELVIS WO CONTRAST  Result Date: 10/13/2019 CLINICAL DATA:  Intermittent vomiting over the past 4 days. Pain in all extremities and in the lower abdomen. Cough. EXAM: CT ABDOMEN AND PELVIS WITHOUT CONTRAST TECHNIQUE: Multidetector CT imaging of the abdomen and pelvis was performed following the standard protocol without IV contrast. COMPARISON:  2/0 4/9 FINDINGS: Lower chest: Clear lung bases.  Heart normal size. Hepatobiliary: No focal liver abnormality is seen. Status post cholecystectomy. No biliary  dilatation. Pancreas: Unremarkable. No pancreatic ductal dilatation or surrounding inflammatory changes. Spleen: Normal in size without focal abnormality. Adrenals/Urinary Tract: No adrenal masses. Kidneys normal in overall size. No renal masses, stones or hydronephrosis. Mild chronic perinephric stranding. Ureters are normal in course and in caliber. No ureteral stones. Normal bladder. Stomach/Bowel: Small hiatal hernia. Stomach otherwise unremarkable. Small bowel and colon are normal in caliber. No wall thickening. No inflammation. Normal appendix visualized. Vascular/Lymphatic: Minor aortic atherosclerotic calcifications. No aneurysm. No enlarged lymph nodes. Reproductive: Uterus and bilateral adnexa are unremarkable. Other: No abdominal wall hernia or abnormality. No abdominopelvic ascites. Musculoskeletal: No fracture or acute finding. No osteoblastic or osteolytic lesions. IMPRESSION: 1. No acute findings within the abdomen or pelvis. 2. Small hiatal hernia. 3. Mild aortic atherosclerosis. Electronically Signed   By: Lajean Manes M.D.   On: 10/13/2019 20:37   CT Head Wo Contrast  Result Date: 10/13/2019 CLINICAL DATA:  Intermittent vomiting over the past 4 days. Headache. EXAM: CT HEAD WITHOUT CONTRAST TECHNIQUE: Contiguous axial images were obtained from the base of the skull through the vertex without intravenous contrast. COMPARISON:  None. FINDINGS: Brain: No evidence of acute infarction, hemorrhage, hydrocephalus, extra-axial collection or mass lesion/mass effect. Vascular: No hyperdense vessel or unexpected calcification. Skull: Normal. Negative for fracture or focal lesion. Sinuses/Orbits: Globes and orbits are unremarkable. Sinuses and mastoid air cells are clear. Other: None. IMPRESSION: Normal unenhanced CT scan of the brain. Electronically Signed   By: Lajean Manes M.D.   On: 10/13/2019 20:32    Scheduled Meds: . amLODipine  5 mg Oral Daily  . aspirin  81 mg Oral Daily  . enoxaparin  (LOVENOX) injection  40 mg Subcutaneous Q24H  . levothyroxine  50 mcg Oral QAC breakfast  . losartan  100 mg Oral Daily  . pantoprazole  40 mg Oral Daily  . rosuvastatin  5 mg Oral Daily  . senna  1 tablet Oral BID   Continuous Infusions: . sodium chloride 100 mL/hr at 10/14/19 1007      Time spent: [place]   10/14/2019, 10:25 AM  LOS: 1 day

## 2019-10-14 NOTE — Progress Notes (Signed)
Lab called with critical NA 115. Pt resting in bed with no c/o voiced. MD on called paged.

## 2019-10-14 NOTE — Plan of Care (Signed)

## 2019-10-14 NOTE — Progress Notes (Signed)
Lab called for critical result. Na+: 114. Notified MD

## 2019-10-14 NOTE — Progress Notes (Signed)
Yellow MEWS score of 2 due to heart rate. Pt in no acute distress. Notified MD, will continue to monitor.

## 2019-10-14 NOTE — Progress Notes (Signed)
CCMD called to notify that patient is in sinus bradycardia. She is trending in the 40s with progressively long pauses. MD notified

## 2019-10-14 NOTE — ED Notes (Signed)
Report given to 3E RN. All questions answered 

## 2019-10-14 NOTE — Progress Notes (Signed)
Lab notified of critical serum osmolality 143. MD on call paged.

## 2019-10-15 ENCOUNTER — Encounter (HOSPITAL_COMMUNITY): Payer: Self-pay | Admitting: Family Medicine

## 2019-10-15 DIAGNOSIS — E119 Type 2 diabetes mellitus without complications: Secondary | ICD-10-CM

## 2019-10-15 LAB — BASIC METABOLIC PANEL
Anion gap: 9 (ref 5–15)
Anion gap: 11 (ref 5–15)
Anion gap: 9 (ref 5–15)
Anion gap: 10 (ref 5–15)
BUN: 7 mg/dL — ABNORMAL LOW (ref 8–23)
BUN: 8 mg/dL (ref 8–23)
BUN: 8 mg/dL (ref 8–23)
BUN: 7 mg/dL — ABNORMAL LOW (ref 8–23)
CO2: 19 mmol/L — ABNORMAL LOW (ref 22–32)
CO2: 17 mmol/L — ABNORMAL LOW (ref 22–32)
CO2: 19 mmol/L — ABNORMAL LOW (ref 22–32)
CO2: 19 mmol/L — ABNORMAL LOW (ref 22–32)
Calcium: 8.2 mg/dL — ABNORMAL LOW (ref 8.9–10.3)
Calcium: 8.5 mg/dL — ABNORMAL LOW (ref 8.9–10.3)
Calcium: 7.8 mg/dL — ABNORMAL LOW (ref 8.9–10.3)
Calcium: 8.4 mg/dL — ABNORMAL LOW (ref 8.9–10.3)
Chloride: 94 mmol/L — ABNORMAL LOW (ref 98–111)
Chloride: 96 mmol/L — ABNORMAL LOW (ref 98–111)
Chloride: 100 mmol/L (ref 98–111)
Chloride: 98 mmol/L (ref 98–111)
Creatinine, Ser: 0.54 mg/dL (ref 0.44–1.00)
Creatinine, Ser: 0.84 mg/dL (ref 0.44–1.00)
Creatinine, Ser: 1.18 mg/dL — ABNORMAL HIGH (ref 0.44–1.00)
Creatinine, Ser: 0.75 mg/dL (ref 0.44–1.00)
GFR calc Af Amer: >60 mL/min (ref >60)
GFR calc Af Amer: >60 mL/min (ref >60)
GFR calc Af Amer: 53 mL/min — ABNORMAL LOW (ref >60)
GFR calc Af Amer: >60 mL/min (ref >60)
GFR calc non Af Amer: >60 mL/min (ref >60)
GFR calc non Af Amer: >60 mL/min (ref >60)
GFR calc non Af Amer: 46 mL/min — ABNORMAL LOW (ref >60)
GFR calc non Af Amer: >60 mL/min (ref >60)
Glucose, Bld: 80 mg/dL (ref 70–99)
Glucose, Bld: 120 mg/dL — ABNORMAL HIGH (ref 70–99)
Glucose, Bld: 115 mg/dL — ABNORMAL HIGH (ref 70–99)
Glucose, Bld: 109 mg/dL — ABNORMAL HIGH (ref 70–99)
Potassium: 3.6 mmol/L (ref 3.5–5.1)
Potassium: 4.2 mmol/L (ref 3.5–5.1)
Potassium: 3.5 mmol/L (ref 3.5–5.1)
Potassium: 3.8 mmol/L (ref 3.5–5.1)
Sodium: 122 mmol/L — ABNORMAL LOW (ref 135–145)
Sodium: 124 mmol/L — ABNORMAL LOW (ref 135–145)
Sodium: 128 mmol/L — ABNORMAL LOW (ref 135–145)
Sodium: 127 mmol/L — ABNORMAL LOW (ref 135–145)

## 2019-10-15 LAB — SODIUM, URINE, RANDOM: Sodium, Ur: 28 mmol/L

## 2019-10-15 LAB — HEMOGLOBIN A1C
Hgb A1c MFr Bld: 7.4 % — ABNORMAL HIGH (ref 4.8–5.6)
Mean Plasma Glucose: 165.68 mg/dL

## 2019-10-15 LAB — OSMOLALITY, URINE: Osmolality, Ur: 173 mOsm/kg — ABNORMAL LOW (ref 300–900)

## 2019-10-15 LAB — OSMOLALITY: Osmolality: 243 mOsm/kg — CL (ref 275–295)

## 2019-10-15 NOTE — Progress Notes (Signed)
[facility] PROGRESS NOTE  KARLYNN SHUGARS [id number] DOB: [birth year] DOA: 10/13/2019 PCP: Shirline Frees, MD  Brief Summary: HPI: this is a 73 y/o female who presents with c/o simply not feeling well.  Per patient she has been throwing up the last 4 days.  She additionally reports constipation, headache, fatigue, chronic cold, and myalagia, all during the last 4 days. She denies any confusion.  She denies any fevers or chills.    She had a UTI treated with bactrim, UTI remained, she was retreated with bactrim. She has not completed the second round of antibiotics d/t her n/v. She denies actual abdominal pain stating it is uncomfortable. She denies  any BOU.   She came to the ER because she was feeling very weak. History provided by the patient and her husband who is present at bedside.  In the ER she was found to be hyponatremic with a sodium of 109.  The hospitalist have been asked to admit. She does have a history of hyponatremia, but her sodium has never been this low.  Assessment/Plan: Principal Problem:   Hyponatremia Active Problems:   Irritable bowel syndrome   Essential hypertension, benign   Dyslipidemia   Diabetes ([place])   Hyponatremia  Secondary to bactrim use.   Sodium 109 on admission.  Urine studies on admission: UNa: 88, UOsm: 407  Chest xray clear  NS 153mL/hour. No evidence of fluid overloaded state.   Continue BMPs every 4 hours.  HTN  Controlled  Diabetes - unknown control  Diet controlled at baseline  Initially on clear liquid diet.  Check hgA1c  Carb mod diet  GERD  PPI  Hypothyroidism  TSH 1.28  On Synthroid  IBS  No complaints  Code Status: Full code Family Communication: None Disposition Plan: home   Consultants:  none  Procedures:  None  Antibiotics:  None  HPI/Subjective: Continues to have some blurred vision. Denies CP, SOB, edema. Has excellent UO.  Objective: Vitals:   10/15/19 0413  10/15/19 0734  BP: 128/63 132/62  Pulse: (!) 46 (!) 49  Resp: 20 19  Temp: 97.7 F (36.5 C) 97.7 F (36.5 C)  SpO2: 99% 98%    Intake/Output Summary (Last 24 hours) at 10/15/2019 0833 Last data filed at 10/15/2019 0733 Gross per 24 hour  Intake 3389.17 ml  Output 3550 ml  Net -160.83 ml   Filed Weights   10/14/19 0248 10/15/19 0029  Weight: 81.5 kg 81.7 kg    Exam:   General: Alert and oriented x3.  No acute distress.  Cardiovascular: Regular rate, no murmurs  Respiratory: Clear to auscultation bilaterally with no wheezes, rales, rhonchi  Abdomen: Soft, nontender, nondistended.  Appropriate bowel sounds.  No hepatosplenomegaly  Musculoskeletal: Major joints nonerythematous.  No contractures.  Moves all 4 extremities without difficulty.  Extremity: No edema.  Extremities warm to touch with 2+ dorsalis pedis and radial pulses.  Data Reviewed: Basic Metabolic Panel: Recent Labs  Lab 10/14/19 0252 10/14/19 0953 10/14/19 1547 10/14/19 2105 10/15/19 0400  NA 113* 114* 118* 115* 122*  K 4.8 3.7 4.3 4.1 3.6  CL 82* 87* 89* 88* 94*  CO2 19* 18* 20* 17* 19*  GLUCOSE 101* 133* 112* 138* 80  BUN 10 9 11 10  7*  CREATININE 0.75 0.70 0.94 0.82 0.54  CALCIUM 8.7* 8.5* 8.5* 8.2* 8.2*   Liver Function Tests: Recent Labs  Lab 10/13/19 1704  AST 34  ALT 20  ALKPHOS 76  BILITOT 1.4*  PROT 6.8  ALBUMIN  4.5   Recent Labs  Lab 10/13/19 1704  LIPASE 47   No results for input(s): AMMONIA in the last 168 hours. CBC: Recent Labs  Lab 10/13/19 1704 10/13/19 2149 10/14/19 0252  WBC 8.4 7.7 8.1  HGB 14.7 14.4 14.1  HCT 40.4 39.2 38.1  MCV 80.3 79.8* 79.4*  PLT 251 236 223   Cardiac Enzymes: No results for input(s): CKTOTAL, CKMB, CKMBINDEX, TROPONINI in the last 168 hours. BNP (last 3 results) No results for input(s): BNP in the last 8760 hours.  ProBNP (last 3 results) No results for input(s): PROBNP in the last 8760 hours.  CBG: No results for input(s):  GLUCAP in the last 168 hours.  Recent Results (from the past 240 hour(s))  SARS CORONAVIRUS 2 (TAT 6-24 HRS) Nasopharyngeal Nasopharyngeal Swab     Status: None   Collection Time: 10/13/19  7:43 PM   Specimen: Nasopharyngeal Swab  Result Value Ref Range Status   SARS Coronavirus 2 NEGATIVE NEGATIVE Final    Comment: (NOTE) SARS-CoV-2 target nucleic acids are NOT DETECTED. The SARS-CoV-2 RNA is generally detectable in upper and lower respiratory specimens during the acute phase of infection. Negative results do not preclude SARS-CoV-2 infection, do not rule out co-infections with other pathogens, and should not be used as the sole basis for treatment or other patient management decisions. Negative results must be combined with clinical observations, patient history, and epidemiological information. The expected result is Negative. Fact Sheet for Patients: SugarRoll.be Fact Sheet for Healthcare Providers: https://www.woods-mathews.com/ This test is not yet approved or cleared by the [place] FDA and  has been authorized for detection and/or diagnosis of SARS-CoV-2 by FDA under an Emergency Use Authorization (EUA). This EUA will remain  in effect (meaning this test can be used) for the duration of the COVID-19 declaration under Section 56 4(b)(1) of the Act, 21 U.S.C. section 360bbb-3(b)(1), unless the authorization is terminated or revoked sooner. Performed at [facility], [place] [address], [place], [place] [address]      Studies: CT ABDOMEN PELVIS WO CONTRAST  Result Date: 10/13/2019 CLINICAL DATA:  Intermittent vomiting over the past 4 days. Pain in all extremities and in the lower abdomen. Cough. EXAM: CT ABDOMEN AND PELVIS WITHOUT CONTRAST TECHNIQUE: Multidetector CT imaging of the abdomen and pelvis was performed following the standard protocol without IV contrast. COMPARISON:  2/0 4/9 FINDINGS: Lower chest: Clear lung bases.   Heart normal size. Hepatobiliary: No focal liver abnormality is seen. Status post cholecystectomy. No biliary dilatation. Pancreas: Unremarkable. No pancreatic ductal dilatation or surrounding inflammatory changes. Spleen: Normal in size without focal abnormality. Adrenals/Urinary Tract: No adrenal masses. Kidneys normal in overall size. No renal masses, stones or hydronephrosis. Mild chronic perinephric stranding. Ureters are normal in course and in caliber. No ureteral stones. Normal bladder. Stomach/Bowel: Small hiatal hernia. Stomach otherwise unremarkable. Small bowel and colon are normal in caliber. No wall thickening. No inflammation. Normal appendix visualized. Vascular/Lymphatic: Minor aortic atherosclerotic calcifications. No aneurysm. No enlarged lymph nodes. Reproductive: Uterus and bilateral adnexa are unremarkable. Other: No abdominal wall hernia or abnormality. No abdominopelvic ascites. Musculoskeletal: No fracture or acute finding. No osteoblastic or osteolytic lesions. IMPRESSION: 1. No acute findings within the abdomen or pelvis. 2. Small hiatal hernia. 3. Mild aortic atherosclerosis. Electronically Signed   By: Lajean Manes M.D.   On: 10/13/2019 20:37   DG Chest 2 View  Result Date: 10/14/2019 CLINICAL DATA:  Cough. EXAM: CHEST - 2 VIEW COMPARISON:  None. FINDINGS: The  heart size and mediastinal contours are within normal limits. The lungs are clear. No pneumothorax or pleural effusion. No acute finding in the visualized skeleton. IMPRESSION: No evidence of active disease in the chest. Electronically Signed   By: Audie Pinto M.D.   On: 10/14/2019 12:48   CT Head Wo Contrast  Result Date: 10/13/2019 CLINICAL DATA:  Intermittent vomiting over the past 4 days. Headache. EXAM: CT HEAD WITHOUT CONTRAST TECHNIQUE: Contiguous axial images were obtained from the base of the skull through the vertex without intravenous contrast. COMPARISON:  None. FINDINGS: Brain: No evidence of acute  infarction, hemorrhage, hydrocephalus, extra-axial collection or mass lesion/mass effect. Vascular: No hyperdense vessel or unexpected calcification. Skull: Normal. Negative for fracture or focal lesion. Sinuses/Orbits: Globes and orbits are unremarkable. Sinuses and mastoid air cells are clear. Other: None. IMPRESSION: Normal unenhanced CT scan of the brain. Electronically Signed   By: Lajean Manes M.D.   On: 10/13/2019 20:32    Scheduled Meds: . amLODipine  5 mg Oral Daily  . aspirin  81 mg Oral Daily  . enoxaparin (LOVENOX) injection  40 mg Subcutaneous Q24H  . levothyroxine  50 mcg Oral QAC breakfast  . pantoprazole  40 mg Oral Daily  . rosuvastatin  5 mg Oral Daily  . senna  1 tablet Oral BID   Continuous Infusions: . sodium chloride 150 mL/hr at 10/15/19 0250      Time spent: [place]   10/15/2019, 8:33 AM  LOS: 2 days

## 2019-10-15 NOTE — Progress Notes (Signed)
Patient Name: [name], female   DOB: 1946/03/27, 73 y.o.  MRN: [id number]   Afternoon BMP showed sodium of 128. D/c fluids and fluid restrict. Recheck at 2000 and tomorrow [place], DO

## 2019-10-15 NOTE — Plan of Care (Signed)

## 2019-10-15 NOTE — Progress Notes (Signed)
Nutrition Brief Note  Patient identified on the Malnutrition Screening Tool (MST) Report  Wt Readings from Last 15 Encounters:  10/15/19 81.7 kg  10/01/19 83.9 kg  08/08/19 84.4 kg  11/24/18 80.6 kg  11/22/17 83.9 kg  05/25/17 83.4 kg  07/26/16 85.5 kg  07/23/15 88.4 kg  06/03/15 86.6 kg  07/02/14 86.2 kg  01/07/14 86.6 kg   73 y/o female who presents with c/o simply not feeling well.  Per patient she has been throwing up the last 4 days.  She additionally reports constipation, headache, fatigue, chronic cold, and myalagia, all during the last 4 days. She denies any confusion.  She denies any fevers or chills.  Pt admitted with hyponatremia.  Reviewed I/O's: +139 ml x 24 hours and +896 ml since admission  UOP: 3.3 L x 24 hours  Spoke with pt at bedside, who reports she was having a good appetite until about 4 days PTA, when she was unable to keep foods and liquids down. Pt reports today she has been able to keep food down and consume about 50% of meals.   PTA pt was consuming 3 meals per day (Breakfast: eggs, sausage, and coffee; Lunch: sandwich and veggies; Dinner: meat, starch, and vegetable). Pt shares she control DM by diet and currently does not take medication for DM. She reports she had a UTI last month and CBGS were elevated (around 140-150) due to infection. CBGS typically range between 100-120. Discussed how DM is treated differently in the hospital due to acute stress response and for tighter control.   Pt reports UBW is around 170#. Wt has been stable over the past year.   Nutrition-Focused physical exam completed. Findings are no fat depletion, no muscle depletion, and no edema.   Medications reviewed and include 0.9% sodium chloride infusion @ 150 ml/hr.   Labs reviewed: Na: 115.   Body mass index is 29.97 kg/m. Patient meets criteria for overweight based on current BMI.   Current diet order is carb modified with 1200 ml fluid restriction, patient is consuming  approximately 50% of meals at this time. Labs and medications reviewed.   No nutrition interventions warranted at this time. If nutrition issues arise, please consult RD.   Zaylee Cornia A. Jimmye Norman, RD, LDN, [place] Registered Dietitian II Certified Diabetes Care and Education Specialist Pager: [phone] After hours Pager: [phone]

## 2019-10-16 LAB — BASIC METABOLIC PANEL
Anion gap: 9 (ref 5–15)
Anion gap: 8 (ref 5–15)
Anion gap: 11 (ref 5–15)
Anion gap: 8 (ref 5–15)
BUN: 7 mg/dL — ABNORMAL LOW (ref 8–23)
BUN: 9 mg/dL (ref 8–23)
BUN: 8 mg/dL (ref 8–23)
BUN: 7 mg/dL — ABNORMAL LOW (ref 8–23)
CO2: 21 mmol/L — ABNORMAL LOW (ref 22–32)
CO2: 21 mmol/L — ABNORMAL LOW (ref 22–32)
CO2: 17 mmol/L — ABNORMAL LOW (ref 22–32)
CO2: 23 mmol/L (ref 22–32)
Calcium: 8.7 mg/dL — ABNORMAL LOW (ref 8.9–10.3)
Calcium: 8.6 mg/dL — ABNORMAL LOW (ref 8.9–10.3)
Calcium: 8.5 mg/dL — ABNORMAL LOW (ref 8.9–10.3)
Calcium: 8.6 mg/dL — ABNORMAL LOW (ref 8.9–10.3)
Chloride: 98 mmol/L (ref 98–111)
Chloride: 98 mmol/L (ref 98–111)
Chloride: 104 mmol/L (ref 98–111)
Chloride: 99 mmol/L (ref 98–111)
Creatinine, Ser: 0.76 mg/dL (ref 0.44–1.00)
Creatinine, Ser: 0.95 mg/dL (ref 0.44–1.00)
Creatinine, Ser: 0.95 mg/dL (ref 0.44–1.00)
Creatinine, Ser: 0.9 mg/dL (ref 0.44–1.00)
GFR calc Af Amer: >60 mL/min (ref >60)
GFR calc Af Amer: >60 mL/min (ref >60)
GFR calc Af Amer: >60 mL/min (ref >60)
GFR calc Af Amer: >60 mL/min (ref >60)
GFR calc non Af Amer: >60 mL/min (ref >60)
GFR calc non Af Amer: 60 mL/min — ABNORMAL LOW (ref >60)
GFR calc non Af Amer: 60 mL/min — ABNORMAL LOW (ref >60)
GFR calc non Af Amer: >60 mL/min (ref >60)
Glucose, Bld: 108 mg/dL — ABNORMAL HIGH (ref 70–99)
Glucose, Bld: 115 mg/dL — ABNORMAL HIGH (ref 70–99)
Glucose, Bld: 104 mg/dL — ABNORMAL HIGH (ref 70–99)
Glucose, Bld: 133 mg/dL — ABNORMAL HIGH (ref 70–99)
Potassium: 3.6 mmol/L (ref 3.5–5.1)
Potassium: 3.6 mmol/L (ref 3.5–5.1)
Potassium: 4.1 mmol/L (ref 3.5–5.1)
Potassium: 3.6 mmol/L (ref 3.5–5.1)
Sodium: 128 mmol/L — ABNORMAL LOW (ref 135–145)
Sodium: 127 mmol/L — ABNORMAL LOW (ref 135–145)
Sodium: 132 mmol/L — ABNORMAL LOW (ref 135–145)
Sodium: 130 mmol/L — ABNORMAL LOW (ref 135–145)

## 2019-10-16 LAB — URINALYSIS, ROUTINE W REFLEX MICROSCOPIC
Bilirubin Urine: NEGATIVE
Glucose, UA: NEGATIVE mg/dL
Hgb urine dipstick: NEGATIVE
Ketones, ur: 5 mg/dL — AB
Leukocytes,Ua: NEGATIVE
Nitrite: NEGATIVE
Protein, ur: NEGATIVE mg/dL
Specific Gravity, Urine: 1.01 (ref 1.005–1.030)
pH: 6 (ref 5.0–8.0)

## 2019-10-16 LAB — MAGNESIUM: Magnesium: 1.9 mg/dL (ref 1.7–2.4)

## 2019-10-16 LAB — PHOSPHORUS: Phosphorus: 3.2 mg/dL (ref 2.5–4.6)

## 2019-10-16 MED ORDER — MELATONIN 3 MG PO TABS
3.0000 mg | ORAL_TABLET | Freq: Every evening | ORAL | Status: DC | PRN
  Administered 2019-10-16 – 2019-10-17 (×2): 3 mg via ORAL
  Filled 2019-10-16 (×3): qty 1

## 2019-10-16 MED ORDER — SODIUM CHLORIDE 0.9 % IV SOLN: INTRAVENOUS | Status: DC

## 2019-10-16 NOTE — Progress Notes (Signed)
CCMD notified of 2.5 sec pause Pt resting with no c/o voiced. MD on call notified.

## 2019-10-16 NOTE — Progress Notes (Signed)
Pt requested something for sleep. States she has never taken anything for sleep before and would like something "light". MD on call paged.

## 2019-10-16 NOTE — Plan of Care (Signed)

## 2019-10-16 NOTE — Progress Notes (Signed)
[facility] PROGRESS NOTE  Molly BOLDS [id number] DOB: 01-15-1946 DOA: 10/13/2019 PCP: Shirline Frees, MD  Brief Summary: HPI: this is a 73 y/o female who presents with c/o simply not feeling well.  Per patient she has been throwing up the last 4 days.  She additionally reports constipation, headache, fatigue, chronic cold, and myalagia, all during the last 4 days. She denies any confusion.  She denies any fevers or chills.    She had a UTI treated with bactrim, UTI remained, she was retreated with bactrim. She has not completed the second round of antibiotics d/t her n/v. She denies actual abdominal pain stating it is uncomfortable. She denies  any BOU.   She came to the ER because she was feeling very weak. History provided by the patient and her husband who is present at bedside.  In the ER she was found to be hyponatremic with a sodium of 109.  The hospitalist have been asked to admit. She does have a history of hyponatremia, but her sodium has never been this low.  Assessment/Plan: Principal Problem:   Hyponatremia Active Problems:   Irritable bowel syndrome   Essential hypertension, benign   Dyslipidemia   Diabetes ([place])   Hyponatremia  Secondary to bactrim use.   Sodium 109 on admission.  Urine studies on admission: UNa: 88, UOsm: 407  Chest xray clear  Sodium plateaued after cessation of IVF last night. Will restart and check BMP at noon.  No evidence of fluid overloaded state.   HTN  Controlled  Diabetes - unknown control  Diet controlled at baseline  Initially on clear liquid diet.  HgA1c: 7.4  CBGs here are cotrolled.   Carb mod diet  GERD  PPI  Hypothyroidism  TSH 1.28  On Synthroid  IBS  No complaints  Code Status: Full code Family Communication: None Disposition Plan: home   Consultants:  none  Procedures:  None  Antibiotics:  None  HPI/Subjective: Blurred vision improving. Didn't sleep very well  last night - describes "frightening images" whenever she closed her eyes. No CP, SOB, headache, n/v.  Objective: Vitals:   10/16/19 0421 10/16/19 0724  BP: 138/63 124/64  Pulse: (!) 49 (!) 52  Resp: 19 19  Temp: 97.7 F (36.5 C) 98.4 F (36.9 C)  SpO2: 98% 98%    Intake/Output Summary (Last 24 hours) at 10/16/2019 0810 Last data filed at 10/16/2019 0420 Gross per 24 hour  Intake 860 ml  Output 3602 ml  Net -2742 ml   Filed Weights   10/14/19 0248 10/15/19 0029 10/16/19 0111  Weight: 81.5 kg 81.7 kg 80.3 kg    Exam:   General: Alert and oriented x3.  No acute distress.  Cardiovascular: Regular rate, no murmurs  Respiratory: Clear to auscultation bilaterally with no wheezes, rales, rhonchi  Abdomen: Soft, nontender, nondistended.  Appropriate bowel sounds.  No hepatosplenomegaly  Musculoskeletal: Major joints nonerythematous.  No contractures.  Moves all 4 extremities without difficulty.  Extremity: No edema.  Extremities warm to touch with 2+ dorsalis pedis and radial pulses.  Data Reviewed: Basic Metabolic Panel: Recent Labs  Lab 10/15/19 0400 10/15/19 0939 10/15/19 1416 10/15/19 2154 10/16/19 0317  NA 122* 124* 128* 127* 128*  K 3.6 4.2 3.5 3.8 3.6  CL 94* 96* 100 98 98  CO2 19* 17* 19* 19* 21*  GLUCOSE 80 120* 115* 109* 108*  BUN 7* 8 8 7* 7*  CREATININE 0.54 0.84 1.18* 0.75 0.76  CALCIUM 8.2* 8.5* 7.8* 8.4* 8.7*  MG  --   --   --   --  1.9  PHOS  --   --   --   --  3.2   Liver Function Tests: Recent Labs  Lab 10/13/19 1704  AST 34  ALT 20  ALKPHOS 76  BILITOT 1.4*  PROT 6.8  ALBUMIN 4.5   Recent Labs  Lab 10/13/19 1704  LIPASE 47   No results for input(s): AMMONIA in the last 168 hours. CBC: Recent Labs  Lab 10/13/19 1704 10/13/19 2149 10/14/19 0252  WBC 8.4 7.7 8.1  HGB 14.7 14.4 14.1  HCT 40.4 39.2 38.1  MCV 80.3 79.8* 79.4*  PLT 251 236 223   Cardiac Enzymes: No results for input(s): CKTOTAL, CKMB, CKMBINDEX, TROPONINI  in the last 168 hours. BNP (last 3 results) No results for input(s): BNP in the last 8760 hours.  ProBNP (last 3 results) No results for input(s): PROBNP in the last 8760 hours.  CBG: No results for input(s): GLUCAP in the last 168 hours.  Recent Results (from the past 240 hour(s))  SARS CORONAVIRUS 2 (TAT 6-24 HRS) Nasopharyngeal Nasopharyngeal Swab     Status: None   Collection Time: 10/13/19  7:43 PM   Specimen: Nasopharyngeal Swab  Result Value Ref Range Status   SARS Coronavirus 2 NEGATIVE NEGATIVE Final    Comment: (NOTE) SARS-CoV-2 target nucleic acids are NOT DETECTED. The SARS-CoV-2 RNA is generally detectable in upper and lower respiratory specimens during the acute phase of infection. Negative results do not preclude SARS-CoV-2 infection, do not rule out co-infections with other pathogens, and should not be used as the sole basis for treatment or other patient management decisions. Negative results must be combined with clinical observations, patient history, and epidemiological information. The expected result is Negative. Fact Sheet for Patients: SugarRoll.be Fact Sheet for Healthcare Providers: https://www.woods-mathews.com/ This test is not yet approved or cleared by the [place] FDA and  has been authorized for detection and/or diagnosis of SARS-CoV-2 by FDA under an Emergency Use Authorization (EUA). This EUA will remain  in effect (meaning this test can be used) for the duration of the COVID-19 declaration under Section 56 4(b)(1) of the Act, 21 U.S.C. section 360bbb-3(b)(1), unless the authorization is terminated or revoked sooner. Performed at [facility], [place] [address] [place], [place] [address]      Studies: DG Chest 2 View  Result Date: 10/14/2019 CLINICAL DATA:  Cough. EXAM: CHEST - 2 VIEW COMPARISON:  None. FINDINGS: The heart size and mediastinal contours are within normal limits. The lungs  are clear. No pneumothorax or pleural effusion. No acute finding in the visualized skeleton. IMPRESSION: No evidence of active disease in the chest. Electronically Signed   By: Audie Pinto M.D.   On: 10/14/2019 12:48    Scheduled Meds: . amLODipine  5 mg Oral Daily  . aspirin  81 mg Oral Daily  . enoxaparin (LOVENOX) injection  40 mg Subcutaneous Q24H  . levothyroxine  50 mcg Oral QAC breakfast  . pantoprazole  40 mg Oral Daily  . rosuvastatin  5 mg Oral Daily  . senna  1 tablet Oral BID   Continuous Infusions: . sodium chloride 150 mL/hr at 10/16/19 [id number]      Time spent: [place]   10/16/2019, 8:10 AM  LOS: 3 days

## 2019-10-17 LAB — BASIC METABOLIC PANEL
Anion gap: 9 (ref 5–15)
Anion gap: 7 (ref 5–15)
BUN: 7 mg/dL — ABNORMAL LOW (ref 8–23)
BUN: 8 mg/dL (ref 8–23)
CO2: 21 mmol/L — ABNORMAL LOW (ref 22–32)
CO2: 24 mmol/L (ref 22–32)
Calcium: 8.3 mg/dL — ABNORMAL LOW (ref 8.9–10.3)
Calcium: 8.8 mg/dL — ABNORMAL LOW (ref 8.9–10.3)
Chloride: 102 mmol/L (ref 98–111)
Chloride: 101 mmol/L (ref 98–111)
Creatinine, Ser: 0.97 mg/dL (ref 0.44–1.00)
Creatinine, Ser: 0.96 mg/dL (ref 0.44–1.00)
GFR calc Af Amer: >60 mL/min (ref >60)
GFR calc Af Amer: >60 mL/min (ref >60)
GFR calc non Af Amer: 58 mL/min — ABNORMAL LOW (ref >60)
GFR calc non Af Amer: 59 mL/min — ABNORMAL LOW (ref >60)
Glucose, Bld: 90 mg/dL (ref 70–99)
Glucose, Bld: 129 mg/dL — ABNORMAL HIGH (ref 70–99)
Potassium: 3.6 mmol/L (ref 3.5–5.1)
Potassium: 3.6 mmol/L (ref 3.5–5.1)
Sodium: 132 mmol/L — ABNORMAL LOW (ref 135–145)
Sodium: 132 mmol/L — ABNORMAL LOW (ref 135–145)

## 2019-10-17 LAB — GLUCOSE, CAPILLARY: Glucose-Capillary: 116 mg/dL — ABNORMAL HIGH (ref 70–99)

## 2019-10-17 NOTE — Plan of Care (Signed)

## 2019-10-17 NOTE — Care Management Important Message (Signed)
Important Message  Patient Details  Name: KYNZIE SNEDAKER MRN: [id number] Date of Birth: February 24, 1946   Medicare Important Message Given:  Yes     Zenon Mayo, RN 10/17/2019, 8:45 AM

## 2019-10-17 NOTE — Progress Notes (Signed)
PROGRESS NOTE    [name]  [id number] DOB: 03/09/1946 DOA: 10/13/2019 PCP: Shirline Frees, MD    Brief Narrative:  73 year old female with history of irritable bowel syndrome, hypertension, hyperlipidemia and diet-controlled diabetes presented to the emergency room with 4 days of not feeling well, constipation and diarrhea, headache fatigue chronic cold and myalgia.  Recently treated for UTI with Bactrim.  In the emergency room patient was found hyponatremic with sodium of 109.  No other findings.   Assessment & Plan:   Principal Problem:   Hyponatremia Active Problems:   Irritable bowel syndrome   Essential hypertension, benign   Dyslipidemia   Diabetes (HCC)  Severe symptomatic hyponatremia: With history of a chronically low sodium, 130-134.  Sodium 109 on admission.  Patient was treated with isotonic saline and fluid restrictions with improvement. Patient does drink about 64 ounces of water religiously at home along with soda. Adequately improved. Discontinue IV fluids.  Mobilize.  Will check BMP every 12 hours to ensure stabilization.  Hypertension: Blood pressure well controlled.  Type 2 diabetes: A1c 7.4.  Patient does not want it to be treated.  GERD: On PPI.  Hypothyroidism: On Synthroid.  TSH 1.28.  Irritable bowel syndrome: No more diarrhea.  Discontinue enteric precautions.   DVT prophylaxis: Lovenox subcu Code Status: Full code Family Communication: None Disposition Plan: home tomorrow if remains a stable   Consultants:   None  Procedures:   None  Antimicrobials:   None   Subjective: No complaints.  Denies any nausea vomiting.  She was able to eat good food.  No more diarrhea.  Objective: Vitals:   10/17/19 0054 10/17/19 0400 10/17/19 0655 10/17/19 0741  BP: 130/61 (!) 129/59  (!) 144/54  Pulse: (!) 47 (!) 48  (!) 48  Resp: 18 18  20   Temp: 98.2 F (36.8 C) 98.2 F (36.8 C)    TempSrc: Oral Oral    SpO2: 97% 98%  96%    Weight:   80.9 kg   Height:        Intake/Output Summary (Last 24 hours) at 10/17/2019 0856 Last data filed at 10/17/2019 0400 Gross per 24 hour  Intake 3411.26 ml  Output 2076 ml  Net 1335.26 ml   Filed Weights   10/15/19 0029 10/16/19 0111 10/17/19 0655  Weight: 81.7 kg 80.3 kg 80.9 kg    Examination:  General exam: Appears calm and comfortable  Respiratory system: Clear to auscultation. Respiratory effort normal. Cardiovascular system: S1 & S2 heard, RRR. No JVD, murmurs, rubs, gallops or clicks. No pedal edema. Gastrointestinal system: Abdomen is nondistended, soft and nontender. No organomegaly or masses felt. Normal bowel sounds heard. Central nervous system: Alert and oriented. No focal neurological deficits. Extremities: Symmetric 5 x 5 power. Skin: No rashes, lesions or ulcers Psychiatry: Judgement and insight appear normal. Mood & affect appropriate.     Data Reviewed: I have personally reviewed following labs and imaging studies  CBC: Recent Labs  Lab 10/13/19 1704 10/13/19 2149 10/14/19 0252  WBC 8.4 7.7 8.1  HGB 14.7 14.4 14.1  HCT 40.4 39.2 38.1  MCV 80.3 79.8* 79.4*  PLT 251 236 [id number]   Basic Metabolic Panel: Recent Labs  Lab 10/16/19 0317 10/16/19 1159 10/16/19 1710 10/16/19 2218 10/17/19 0417  NA 128* 127* 132* 130* 132*  K 3.6 3.6 4.1 3.6 3.6  CL 98 98 104 99 102  CO2 21* 21* 17* 23 21*  GLUCOSE 108* 115* 104* 133* 90  BUN 7* 9 8  7* 7*  CREATININE 0.76 0.95 0.95 0.90 0.97  CALCIUM 8.7* 8.6* 8.5* 8.6* 8.3*  MG 1.9  --   --   --   --   PHOS 3.2  --   --   --   --    GFR: Estimated Creatinine Clearance: 55.1 mL/min (by C-G formula based on SCr of 0.97 mg/dL). Liver Function Tests: Recent Labs  Lab 10/13/19 1704  AST 34  ALT 20  ALKPHOS 76  BILITOT 1.4*  PROT 6.8  ALBUMIN 4.5   Recent Labs  Lab 10/13/19 1704  LIPASE 47   No results for input(s): AMMONIA in the last 168 hours. Coagulation Profile: No results for input(s):  INR, PROTIME in the last 168 hours. Cardiac Enzymes: No results for input(s): CKTOTAL, CKMB, CKMBINDEX, TROPONINI in the last 168 hours. BNP (last 3 results) No results for input(s): PROBNP in the last 8760 hours. HbA1C: Recent Labs    10/15/19 0939  HGBA1C 7.4*   CBG: No results for input(s): GLUCAP in the last 168 hours. Lipid Profile: No results for input(s): CHOL, HDL, LDLCALC, TRIG, CHOLHDL, LDLDIRECT in the last 72 hours. Thyroid Function Tests: No results for input(s): TSH, T4TOTAL, FREET4, T3FREE, THYROIDAB in the last 72 hours. Anemia Panel: No results for input(s): VITAMINB12, FOLATE, FERRITIN, TIBC, IRON, RETICCTPCT in the last 72 hours. Sepsis Labs: No results for input(s): PROCALCITON, LATICACIDVEN in the last 168 hours.  Recent Results (from the past 240 hour(s))  SARS CORONAVIRUS 2 (TAT 6-24 HRS) Nasopharyngeal Nasopharyngeal Swab     Status: None   Collection Time: 10/13/19  7:43 PM   Specimen: Nasopharyngeal Swab  Result Value Ref Range Status   SARS Coronavirus 2 NEGATIVE NEGATIVE Final    Comment: (NOTE) SARS-CoV-2 target nucleic acids are NOT DETECTED. The SARS-CoV-2 RNA is generally detectable in upper and lower respiratory specimens during the acute phase of infection. Negative results do not preclude SARS-CoV-2 infection, do not rule out co-infections with other pathogens, and should not be used as the sole basis for treatment or other patient management decisions. Negative results must be combined with clinical observations, patient history, and epidemiological information. The expected result is Negative. Fact Sheet for Patients: SugarRoll.be Fact Sheet for Healthcare Providers: https://www.woods-mathews.com/ This test is not yet approved or cleared by the [place] FDA and  has been authorized for detection and/or diagnosis of SARS-CoV-2 by FDA under an Emergency Use Authorization (EUA). This EUA will  remain  in effect (meaning this test can be used) for the duration of the COVID-19 declaration under Section 56 4(b)(1) of the Act, 21 U.S.C. section 360bbb-3(b)(1), unless the authorization is terminated or revoked sooner. Performed at [facility], [place] [address] [place], [place] [address]          Radiology Studies: No results found.      Scheduled Meds: . amLODipine  5 mg Oral Daily  . aspirin  81 mg Oral Daily  . enoxaparin (LOVENOX) injection  40 mg Subcutaneous Q24H  . levothyroxine  50 mcg Oral QAC breakfast  . pantoprazole  40 mg Oral Daily  . rosuvastatin  5 mg Oral Daily  . senna  1 tablet Oral BID   Continuous Infusions:   LOS: 4 days    Time spent: 25 minutes     Barb Merino, MD [facility] Pager [phone]

## 2019-10-18 LAB — BASIC METABOLIC PANEL
Anion gap: 10 (ref 5–15)
BUN: 8 mg/dL (ref 8–23)
CO2: 25 mmol/L (ref 22–32)
Calcium: 8.9 mg/dL (ref 8.9–10.3)
Chloride: 97 mmol/L — ABNORMAL LOW (ref 98–111)
Creatinine, Ser: 0.87 mg/dL (ref 0.44–1.00)
GFR calc Af Amer: >60 mL/min (ref >60)
GFR calc non Af Amer: >60 mL/min (ref >60)
Glucose, Bld: 93 mg/dL (ref 70–99)
Potassium: 3.7 mmol/L (ref 3.5–5.1)
Sodium: 132 mmol/L — ABNORMAL LOW (ref 135–145)

## 2019-10-18 NOTE — Discharge Summary (Signed)
Physician Discharge Summary  [name] [id number] DOB: September 04, 1946 DOA: 10/13/2019  PCP: Shirline Frees, MD  Admit date: 10/13/2019 Discharge date: 10/18/2019   Admitted From: Home Disposition: Home  Recommendations for Outpatient Follow-up:  1. Follow up with PCP in 1-2 weeks 2. Please obtain BMP in 1 week.  Home Health: Not applicable Equipment/Devices: Not applicable  Discharge Condition: Stable CODE STATUS: Full code Diet recommendation: Regular diet, fluid restriction 1200 mL a day.  Discharge summary: 73 year old female with history of irritable bowel syndrome, hypertension, hyperlipidemia and diet-controlled diabetes presented to the emergency room with 4 days of not feeling well, constipation and diarrhea, headache fatigue chronic cold and myalgia.  Recently treated for UTI with Bactrim.  In the emergency room patient was found hyponatremic with sodium of 109.  No other findings.  Initial CT head was normal.  CT abdomen pelvis was normal.  Treated for following conditions:  Severe symptomatic hyponatremia: With history of a chronically low sodium, 130-134.  Sodium 109 on admission.  Patient was treated with isotonic saline and fluid restrictions with improvement. Patient does drink about 64 ounces of water religiously at home along with soda. Adequately improved. Her sodium was gradually corrected over last 5 days.  With fluid restriction and no additional IV fluids, her sodium has a stabilized 132.  Hypertension: Blood pressure well controlled.  Type 2 diabetes: A1c 7.4.  Patient does not want it to be treated.  She will monitor her carbohydrate intake.  GERD: On PPI.  Hypothyroidism: On Synthroid.  TSH 1.28.  Irritable bowel syndrome: No more diarrhea.  Discontinue enteric precautions.  Patient has significantly improved.  She does complain of some blurry vision, however no neurological deficit, no gait abnormalities and no evidence of visual field  deficit.  Advised her to monitor symptoms and follow-up with ophthalmology.  Discharge Diagnoses:  Principal Problem:   Hyponatremia Active Problems:   Irritable bowel syndrome   Essential hypertension, benign   Dyslipidemia   Diabetes [facility])    Discharge Instructions  Discharge Instructions    Call MD for:  persistant dizziness or light-headedness   Complete by: As directed    Diet general   Complete by: As directed    1200 ml fluid in 24 hours   Discharge instructions   Complete by: As directed    Schedule a follow up with eye doctor   Increase activity slowly   Complete by: As directed      Allergies as of 10/18/2019      Reactions   Ciprofloxacin Other (See Comments)   Muscle aches/pains   Clarithromycin Nausea And Vomiting   Hydrochlorothiazide Other (See Comments)   Decreased Potassium and decreased Sodium   Lipitor [atorvastatin] Other (See Comments)   CPK   Niaspan [niacin Er] Other (See Comments)   CPK   Triglide [fenofibrate] Other (See Comments)   CPK   Welchol [colesevelam Hcl] Other (See Comments)   CPK   Zetia [ezetimibe] Other (See Comments)   CPK   Zostavax [zoster Vaccine Live]    Doesn't recall the reaction   Amoxicillin Rash   Clindamycin/lincomycin Rash      Medication List    TAKE these medications   amLODipine 5 MG tablet Commonly known as: NORVASC Take 5 mg by mouth daily.   aspirin 81 MG tablet Take 81 mg by mouth See admin instructions. Take 81 mg by mouth at bedtime on Tues/Thurs/Sat   esomeprazole 40 MG capsule Commonly known as: NEXIUM Take 40 mg by  mouth daily.   famotidine 40 MG tablet Commonly known as: PEPCID Take 40 mg by mouth at bedtime.   fluticasone 50 MCG/ACT nasal spray Commonly known as: FLONASE Place 2 sprays into both nostrils daily as needed for allergies or rhinitis.   levothyroxine 50 MCG tablet Commonly known as: SYNTHROID Take 50 mcg by mouth daily before breakfast.   losartan 50 MG  tablet Commonly known as: COZAAR Take 100 mg by mouth daily.   ondansetron 4 MG tablet Commonly known as: ZOFRAN Take 4 mg by mouth every 6 (six) hours as needed for nausea or vomiting.   rosuvastatin 5 MG tablet Commonly known as: CRESTOR Take 5 mg by mouth daily.      Follow-up Information    Shirline Frees, MD. Go on 10/24/2019.   Specialty: Family Medicine Why: @8 :30am;Please call 12/22 for COVID screen questions Contact information: [place] [address] [phone]          Allergies  Allergen Reactions  . Ciprofloxacin Other (See Comments)    Muscle aches/pains  . Clarithromycin Nausea And Vomiting  . Hydrochlorothiazide Other (See Comments)    Decreased Potassium and decreased Sodium  . Lipitor [Atorvastatin] Other (See Comments)    CPK  . Niaspan [Niacin Er] Other (See Comments)    CPK  . Triglide [Fenofibrate] Other (See Comments)    CPK  . Welchol [Colesevelam Hcl] Other (See Comments)    CPK  . Zetia [Ezetimibe] Other (See Comments)    CPK   . Zostavax [Zoster Vaccine Live]     Doesn't recall the reaction  . Amoxicillin Rash  . Clindamycin/Lincomycin Rash    Consultations:  None   Procedures/Studies: CT ABDOMEN PELVIS WO CONTRAST  Result Date: 10/13/2019 CLINICAL DATA:  Intermittent vomiting over the past 4 days. Pain in all extremities and in the lower abdomen. Cough. EXAM: CT ABDOMEN AND PELVIS WITHOUT CONTRAST TECHNIQUE: Multidetector CT imaging of the abdomen and pelvis was performed following the standard protocol without IV contrast. COMPARISON:  2/0 4/9 FINDINGS: Lower chest: Clear lung bases.  Heart normal size. Hepatobiliary: No focal liver abnormality is seen. Status post cholecystectomy. No biliary dilatation. Pancreas: Unremarkable. No pancreatic ductal dilatation or surrounding inflammatory changes. Spleen: Normal in size without focal abnormality. Adrenals/Urinary Tract: No adrenal masses. Kidneys  normal in overall size. No renal masses, stones or hydronephrosis. Mild chronic perinephric stranding. Ureters are normal in course and in caliber. No ureteral stones. Normal bladder. Stomach/Bowel: Small hiatal hernia. Stomach otherwise unremarkable. Small bowel and colon are normal in caliber. No wall thickening. No inflammation. Normal appendix visualized. Vascular/Lymphatic: Minor aortic atherosclerotic calcifications. No aneurysm. No enlarged lymph nodes. Reproductive: Uterus and bilateral adnexa are unremarkable. Other: No abdominal wall hernia or abnormality. No abdominopelvic ascites. Musculoskeletal: No fracture or acute finding. No osteoblastic or osteolytic lesions. IMPRESSION: 1. No acute findings within the abdomen or pelvis. 2. Small hiatal hernia. 3. Mild aortic atherosclerosis. Electronically Signed   By: Lajean Manes M.D.   On: 10/13/2019 20:37   DG Chest 2 View  Result Date: 10/14/2019 CLINICAL DATA:  Cough. EXAM: CHEST - 2 VIEW COMPARISON:  None. FINDINGS: The heart size and mediastinal contours are within normal limits. The lungs are clear. No pneumothorax or pleural effusion. No acute finding in the visualized skeleton. IMPRESSION: No evidence of active disease in the chest. Electronically Signed   By: Audie Pinto M.D.   On: 10/14/2019 12:48   CT Head Wo Contrast  Result Date:  10/13/2019 CLINICAL DATA:  Intermittent vomiting over the past 4 days. Headache. EXAM: CT HEAD WITHOUT CONTRAST TECHNIQUE: Contiguous axial images were obtained from the base of the skull through the vertex without intravenous contrast. COMPARISON:  None. FINDINGS: Brain: No evidence of acute infarction, hemorrhage, hydrocephalus, extra-axial collection or mass lesion/mass effect. Vascular: No hyperdense vessel or unexpected calcification. Skull: Normal. Negative for fracture or focal lesion. Sinuses/Orbits: Globes and orbits are unremarkable. Sinuses and mastoid air cells are clear. Other: None.  IMPRESSION: Normal unenhanced CT scan of the brain. Electronically Signed   By: Lajean Manes M.D.   On: 10/13/2019 20:32     Subjective: Patient seen and examined.  No overnight events.  He has some blurry vision, however able to see in function with her glasses.  Appetite is normal.  Bowel functions are adequate.  Eager to go home.   Discharge Exam: Vitals:   10/18/19 0428 10/18/19 0826  BP: (!) 144/57 (!) 125/52  Pulse: (!) 50 (!) 56  Resp: 18 18  Temp: 98 F (36.7 C) 98 F (36.7 C)  SpO2: 97% 95%   Vitals:   10/17/19 1351 10/17/19 1931 10/18/19 0428 10/18/19 0826  BP: (!) 119/51 (!) 144/66 (!) 144/57 (!) 125/52  Pulse: (!) 52 (!) 52 (!) 50 (!) 56  Resp: 20  18 18   Temp: 98 F (36.7 C) (!) 97.3 F (36.3 C) 98 F (36.7 C) 98 F (36.7 C)  TempSrc: Oral Oral Oral Oral  SpO2: 97% 98% 97% 95%  Weight:   80.2 kg   Height:        General: Pt is alert, awake, not in acute distress Cardiovascular: RRR, S1/S2 +, no rubs, no gallops Respiratory: CTA bilaterally, no wheezing, no rhonchi Abdominal: Soft, NT, ND, bowel sounds + Extremities: no edema, no cyanosis    The results of significant diagnostics from this hospitalization (including imaging, microbiology, ancillary and laboratory) are listed below for reference.     Microbiology: Recent Results (from the past 240 hour(s))  SARS CORONAVIRUS 2 (TAT 6-24 HRS) Nasopharyngeal Nasopharyngeal Swab     Status: None   Collection Time: 10/13/19  7:43 PM   Specimen: Nasopharyngeal Swab  Result Value Ref Range Status   SARS Coronavirus 2 NEGATIVE NEGATIVE Final    Comment: (NOTE) SARS-CoV-2 target nucleic acids are NOT DETECTED. The SARS-CoV-2 RNA is generally detectable in upper and lower respiratory specimens during the acute phase of infection. Negative results do not preclude SARS-CoV-2 infection, do not rule out co-infections with other pathogens, and should not be used as the sole basis for treatment or other patient  management decisions. Negative results must be combined with clinical observations, patient history, and epidemiological information. The expected result is Negative. Fact Sheet for Patients: SugarRoll.be Fact Sheet for Healthcare Providers: https://www.woods-mathews.com/ This test is not yet approved or cleared by the [place] FDA and  has been authorized for detection and/or diagnosis of SARS-CoV-2 by FDA under an Emergency Use Authorization (EUA). This EUA will remain  in effect (meaning this test can be used) for the duration of the COVID-19 declaration under Section 56 4(b)(1) of the Act, 21 U.S.C. section 360bbb-3(b)(1), unless the authorization is terminated or revoked sooner. Performed at [facility], [place] [address] [place], [place] [address]      Labs: BNP (last 3 results) No results for input(s): BNP in the last 8760 hours. Basic Metabolic Panel: Recent Labs  Lab 10/16/19 0317 10/16/19 1710 10/16/19 2218 10/17/19 0417 10/17/19 1635  10/18/19 0455  NA 128* 132* 130* 132* 132* 132*  K 3.6 4.1 3.6 3.6 3.6 3.7  CL 98 104 99 102 101 97*  CO2 21* 17* 23 21* 24 25  GLUCOSE 108* 104* 133* 90 129* 93  BUN 7* 8 7* 7* 8 8  CREATININE 0.76 0.95 0.90 0.97 0.96 0.87  CALCIUM 8.7* 8.5* 8.6* 8.3* 8.8* 8.9  MG 1.9  --   --   --   --   --   PHOS 3.2  --   --   --   --   --    Liver Function Tests: Recent Labs  Lab 10/13/19 1704  AST 34  ALT 20  ALKPHOS 76  BILITOT 1.4*  PROT 6.8  ALBUMIN 4.5   Recent Labs  Lab 10/13/19 1704  LIPASE 47   No results for input(s): AMMONIA in the last 168 hours. CBC: Recent Labs  Lab 10/13/19 1704 10/13/19 2149 10/14/19 0252  WBC 8.4 7.7 8.1  HGB 14.7 14.4 14.1  HCT 40.4 39.2 38.1  MCV 80.3 79.8* 79.4*  PLT 251 236 223   Cardiac Enzymes: No results for input(s): CKTOTAL, CKMB, CKMBINDEX, TROPONINI in the last 168 hours. BNP: Invalid input(s): POCBNP CBG: Recent Labs   Lab 10/17/19 2113  GLUCAP 116*   D-Dimer No results for input(s): DDIMER in the last 72 hours. Hgb A1c No results for input(s): HGBA1C in the last 72 hours. Lipid Profile No results for input(s): CHOL, HDL, LDLCALC, TRIG, CHOLHDL, LDLDIRECT in the last 72 hours. Thyroid function studies No results for input(s): TSH, T4TOTAL, T3FREE, THYROIDAB in the last 72 hours.  Invalid input(s): FREET3 Anemia work up No results for input(s): VITAMINB12, FOLATE, FERRITIN, TIBC, IRON, RETICCTPCT in the last 72 hours. Urinalysis    Component Value Date/Time   COLORURINE YELLOW 10/16/2019 [place] 10/16/2019 0839   LABSPEC 1.010 10/16/2019 0839   PHURINE 6.0 10/16/2019 0839   GLUCOSEU NEGATIVE 10/16/2019 0839   HGBUR NEGATIVE 10/16/2019 0839   BILIRUBINUR NEGATIVE 10/16/2019 0839   KETONESUR 5 (A) 10/16/2019 0839   PROTEINUR NEGATIVE 10/16/2019 0839   UROBILINOGEN 0.2 10/13/2019 1554   NITRITE NEGATIVE 10/16/2019 0839   LEUKOCYTESUR NEGATIVE 10/16/2019 0839   Sepsis Labs Invalid input(s): PROCALCITONIN,  WBC,  LACTICIDVEN Microbiology Recent Results (from the past 240 hour(s))  SARS CORONAVIRUS 2 (TAT 6-24 HRS) Nasopharyngeal Nasopharyngeal Swab     Status: None   Collection Time: 10/13/19  7:43 PM   Specimen: Nasopharyngeal Swab  Result Value Ref Range Status   SARS Coronavirus 2 NEGATIVE NEGATIVE Final    Comment: (NOTE) SARS-CoV-2 target nucleic acids are NOT DETECTED. The SARS-CoV-2 RNA is generally detectable in upper and lower respiratory specimens during the acute phase of infection. Negative results do not preclude SARS-CoV-2 infection, do not rule out co-infections with other pathogens, and should not be used as the sole basis for treatment or other patient management decisions. Negative results must be combined with clinical observations, patient history, and epidemiological information. The expected result is Negative. Fact Sheet for  Patients: SugarRoll.be Fact Sheet for Healthcare Providers: https://www.woods-mathews.com/ This test is not yet approved or cleared by the [place] FDA and  has been authorized for detection and/or diagnosis of SARS-CoV-2 by FDA under an Emergency Use Authorization (EUA). This EUA will remain  in effect (meaning this test can be used) for the duration of the COVID-19 declaration under Section 56 4(b)(1) of the Act, 21 U.S.C. section 360bbb-3(b)(1), unless the  authorization is terminated or revoked sooner. Performed at [facility], [place] [address], [place], [place] [address]      Time coordinating discharge:  45 minutes  SIGNED:   Barb Merino, MD  [facility] 10/18/2019, 10:38 AM

## 2019-12-06 DIAGNOSIS — E7800 Pure hypercholesterolemia, unspecified: Secondary | ICD-10-CM | POA: Diagnosis not present

## 2019-12-06 DIAGNOSIS — I10 Essential (primary) hypertension: Secondary | ICD-10-CM | POA: Diagnosis not present

## 2019-12-06 DIAGNOSIS — E039 Hypothyroidism, unspecified: Secondary | ICD-10-CM | POA: Diagnosis not present

## 2019-12-06 DIAGNOSIS — Z800 Family history of malignant neoplasm of digestive organs: Secondary | ICD-10-CM | POA: Diagnosis not present

## 2019-12-06 DIAGNOSIS — Z0000 Encounter for general adult medical examination without abnormal findings: Secondary | ICD-10-CM | POA: Diagnosis not present

## 2019-12-06 DIAGNOSIS — E119 Type 2 diabetes mellitus without complications: Secondary | ICD-10-CM | POA: Diagnosis not present

## 2019-12-06 DIAGNOSIS — K219 Gastro-esophageal reflux disease without esophagitis: Secondary | ICD-10-CM | POA: Diagnosis not present

## 2019-12-06 DIAGNOSIS — R748 Abnormal levels of other serum enzymes: Secondary | ICD-10-CM | POA: Diagnosis not present

## 2019-12-21 DIAGNOSIS — G44209 Tension-type headache, unspecified, not intractable: Secondary | ICD-10-CM | POA: Diagnosis not present

## 2019-12-21 DIAGNOSIS — I10 Essential (primary) hypertension: Secondary | ICD-10-CM | POA: Diagnosis not present

## 2019-12-21 DIAGNOSIS — J0100 Acute maxillary sinusitis, unspecified: Secondary | ICD-10-CM | POA: Diagnosis not present

## 2019-12-21 DIAGNOSIS — E119 Type 2 diabetes mellitus without complications: Secondary | ICD-10-CM | POA: Diagnosis not present

## 2020-01-21 DIAGNOSIS — R0789 Other chest pain: Secondary | ICD-10-CM | POA: Diagnosis not present

## 2020-02-05 ENCOUNTER — Encounter: Payer: Self-pay | Admitting: Cardiology

## 2020-02-05 ENCOUNTER — Ambulatory Visit (INDEPENDENT_AMBULATORY_CARE_PROVIDER_SITE_OTHER): Payer: Medicare Other | Admitting: Cardiology

## 2020-02-05 ENCOUNTER — Other Ambulatory Visit: Payer: Self-pay

## 2020-02-05 VITALS — BP 110/62 | HR 62 | Ht 64.5 in | Wt 181.0 lb

## 2020-02-05 DIAGNOSIS — R001 Bradycardia, unspecified: Secondary | ICD-10-CM

## 2020-02-05 DIAGNOSIS — I110 Hypertensive heart disease with heart failure: Secondary | ICD-10-CM | POA: Diagnosis not present

## 2020-02-05 DIAGNOSIS — R079 Chest pain, unspecified: Secondary | ICD-10-CM | POA: Diagnosis not present

## 2020-02-05 DIAGNOSIS — E871 Hypo-osmolality and hyponatremia: Secondary | ICD-10-CM

## 2020-02-05 DIAGNOSIS — R0789 Other chest pain: Secondary | ICD-10-CM | POA: Diagnosis not present

## 2020-02-05 DIAGNOSIS — I200 Unstable angina: Secondary | ICD-10-CM | POA: Diagnosis not present

## 2020-02-05 DIAGNOSIS — I10 Essential (primary) hypertension: Secondary | ICD-10-CM | POA: Diagnosis not present

## 2020-02-05 DIAGNOSIS — I2510 Atherosclerotic heart disease of native coronary artery without angina pectoris: Secondary | ICD-10-CM

## 2020-02-05 DIAGNOSIS — R9431 Abnormal electrocardiogram [ECG] [EKG]: Secondary | ICD-10-CM

## 2020-02-05 NOTE — Progress Notes (Signed)
Cardiology Office Note   Date:  02/05/2020   ID:  KIRBI SIRMON, DOB 1946/02/02, MRN [id number]  PCP:  Shirline Frees, MD  Cardiologist:  Dr. Marlou Porch    Chief Complaint  Patient presents with  . Loss of Consciousness  . Bradycardia      History of Present Illness: [name] is a 74 y.o. female who presents for chest pain and follow-up of prior syncope hyperlipidemia hypertension with diabetes.  Several years ago had syncope after increasing her dose of Azor.  Cardiac catheterization previously showed no significant coronary artery disease.   EKG previously showed T wave inversion in V3 through V5.  Previous left shoulder pain midepigastric discomfort occasional nausea.  She also had mildly elevated CKs in the 290s to 300s which have been stable.  No prior MI, no prior strokes.  Has chronic pedal edema.  Was sick for most of the year. Stomach, left arm discomfort, sweats, Pain acrorss stomach. No cough. Stomach still iffy.  Also worried about her heart rate mainly in the 50s.  She believes that it does not increase appropriately during exercise.  No syncope.  No bleeding no orthopnea.  08/08/2019  -Here for follow-up of bradycardia.  Amlodipine cut back to 5mg  now. 160 SBP at times.  Concerned about low heart rate.  At times it can drop into the upper 30s.  She is not having any syncope.  Seems to have been a gradually worsening issue for her.  Thankfully no high risk symptoms at this point.  Coronary CT was reassuring given her atypical chest pain.  Denies any fevers chills nausea vomiting syncope bleeding.  She did have a 30-day event monitor and mid-to-late August.  We will find the results.  Was to have PPM but was so near Christmas was holding off had virtual visit with Dr. [name]  In Dec was hospitalized with severe symptomatic hyponatremia  Since Dec. She has had episodic fatigue.  She helped her son with moving objects and developed chest pain with radiation  down Lt arm and across back.  She saw her PCP and EKG was stable, she has had similar episodes in past and was GI.  The pain continues off and on.  She can walk and does ok but other times with moving she has pain.  Last cath in 2012 with normal coronary arteries.  Her energy seems to be no better and she is ready to proceed with PPM.  She needs colonoscopy and will plan this after PPM, concern she will be too slow HR with prep and procedure.        Past Medical History:  Diagnosis Date  . Acute meniscal tear of knee   . Basal cell carcinoma   . Colon polyp   . Diabetes ([place])   . Diverticulosis   . Edema   . Esophageal reflux   . Fibrocystic breast   . Hemorrhoids   . HTN (hypertension)   . Hyperlipidemia   . Hyponatremia   . Hypothyroidism   . OA (osteoarthritis) of hip   . Seasonal allergies   . Sinus bradycardia   . Syncope    vagal,  in the setting of GI cramping    Past Surgical History:  Procedure Laterality Date  . CHOLECYSTECTOMY    . TONSILLECTOMY    . tubal ligation       Current Outpatient Medications  Medication Sig Dispense Refill  . amLODipine (NORVASC) 5 MG tablet Take 5 mg by mouth  daily.    . aspirin 81 MG tablet Take 81 mg by mouth See admin instructions. Take 81 mg by mouth at bedtime on Tues/Thurs/Sat    . esomeprazole (NEXIUM) 40 MG capsule Take 40 mg by mouth daily.    . fluticasone (FLONASE) 50 MCG/ACT nasal spray Place 2 sprays into both nostrils daily as needed for allergies or rhinitis.     Marland Kitchen levothyroxine (SYNTHROID, LEVOTHROID) 50 MCG tablet Take 50 mcg by mouth daily before breakfast.    . losartan (COZAAR) 50 MG tablet Take 100 mg by mouth daily.    . rosuvastatin (CRESTOR) 5 MG tablet Take 5 mg by mouth daily.     No current facility-administered medications for this visit.    Allergies:   Ciprofloxacin, Clarithromycin, Hydrochlorothiazide, Lipitor [atorvastatin], Niaspan [niacin er], Triglide [fenofibrate], Welchol [colesevelam hcl],  Zetia [ezetimibe], Zostavax [zoster vaccine live], Amoxicillin, and Clindamycin/lincomycin    Social History:  The patient  reports that she has never smoked. She has never used smokeless tobacco. She reports previous alcohol use. She reports that she does not use drugs.   Family History:  The patient's family history includes Colon cancer in her mother; Coronary artery disease in her mother; Heart disease in her mother; Hypertension in her mother; Other in her brother; Pancreatic cancer in her father.    ROS:  General:no colds or fevers, no weight changes Skin:no rashes or ulcers HEENT:no blurred vision, no congestion CV:see HPI PUL:see HPI GI:no diarrhea constipation or melena, no indigestion GU:no hematuria, no dysuria MS:no joint pain, no claudication Neuro:no syncope, no lightheadedness despite HR  Endo:no diabetes, + thyroid disease  Wt Readings from Last 3 Encounters:  02/05/20 181 lb (82.1 kg)  10/18/19 176 lb 14.4 oz (80.2 kg)  10/01/19 185 lb (83.9 kg)     PHYSICAL EXAM: VS:  BP 110/62   Pulse 62   Ht 5' 4.5" (1.638 m)   Wt 181 lb (82.1 kg)   BMI 30.59 kg/m  , BMI Body mass index is 30.59 kg/m. General:Pleasant affect, NAD Skin:Warm and dry, brisk capillary refill HEENT:normocephalic, sclera clear, mucus membranes moist Neck:supple, no JVD, no bruits  Heart:S1S2 RRR without murmur, gallup, rub or click slow at times.  No chest wall pain to palpation  Lungs:clear without rales, rhonchi, or wheezes [id number], non tender, + BS, do not palpate liver spleen or masses, + epigastric pain to palpation.  Ext:no lower ext edema, 2+ pedal pulses, 2+ radial pulses Neuro:alert and oriented X 3, MAE, follows commands, + facial symmetry    EKG:  EKG is ordered today. The ekg ordered today demonstrates Abnormal with HR 43, SB --T wave inversions lateral leads still present but not as deep.     Recent Labs: 10/13/2019: ALT 20; TSH 1.282 10/14/2019: Hemoglobin 14.1;  Platelets 223 10/16/2019: Magnesium 1.9 10/18/2019: BUN 8; Creatinine, Ser 0.87; Potassium 3.7; Sodium 132    Lipid Panel No results found for: CHOL, TRIG, HDL, CHOLHDL, VLDL, LDLCALC, LDLDIRECT     Other studies Reviewed: Additional studies/ records that were reviewed today include: . Cardiac monitor   Sinus bradycardia - min 31 bpm, avg 48 bpm, max 99 bpm  3 separate pauses of 3.0 to 3.1 seconds (7:55am, 5am, 3am)  Minimum heart rate was 31-33 bpm, sinus bradycardia, occasoinally from 5-7am. However 39 bpm was noted at 5:18 pm on 06/21/19  One noted episode of juntional escape  No atrial fibrillation, no complete heart block, no obvious second degree heart block.  There are instances  of heart rate min 34 and max 77. (Day 2, 06/22/19). Suggestive of chronotropic incompetence.   Although she is currently not having any high risk symptom such as syncope, I would like for her to be evaluated by EP. Please set up consultation. At some point pacemaker may need warranted.  Candee Furbish, MD   ASSESSMENT AND PLAN:  1.  Chest pain/ Unstable angina - for 3 weeks,  similar to prior GI pain but has been since 2012 for eval.  EKG with no acute ST changes.   HR 43, SB  reviewed with Dr. Marlou Porch and will plan for cardiac CTA, will do soon so pt can go ahead with pacemaker.   She will not need BB due to HR with rest at 43.   Reviewed hospital notes from 2020 dec and labs and tele strips also Dr. Jackalyn Lombard notes.  2.  symptomatic bradycardia.  Needs PPM.  Would like to speak with Dr. [name] in person to review.  I reviewed today but will arrange for appt with Dr. [name].  She has not had dizziness or syncope but understands to call for this.    3.  Abnormal esophageal on esophagram.  If cardiac CTa and PPM then proceed with GI workup   4.  DM-2 diet controlled.  Stable.  5.  Elevated CPK  Chronic   6.  Hyponatremia with admit in Dec 2020.  Followed by her PCP.   7.  HLD on statin crestor  followed by PCP  8.  Abnormal EKG with chest pain  9.  HTN stable on cozaar.and amlodipine.   She will follow up with Dr. Marlou Porch in 6 months depending on CTA of heart and PPM        Current medicines are reviewed with the patient today.  The patient Has no concerns regarding medicines.  The following changes have been made:  See above Labs/ tests ordered today include:see above  Disposition:   FU:  see above  Signed, Cecilie Kicks, NP  02/05/2020 2:50 PM    [facility] [place], [place], [place], [place] Phone: [phone]; Fax: [phone]

## 2020-02-05 NOTE — Patient Instructions (Signed)
Medication Instructions:  Your physician recommends that you continue on your current medications as directed. Please refer to the Current Medication list given to you today.  *If you need a refill on your cardiac medications before your next appointment, please call your pharmacy*   Lab Work: TODAY:  BMET  If you have labs (blood work) drawn today and your tests are completely normal, you will receive your results only by: Marland Kitchen MyChart Message (if you have MyChart) OR . A paper copy in the mail If you have any lab test that is abnormal or we need to change your treatment, we will call you to review the results.   Testing/Procedures: Your physician has requested that you have cardiac CT. Cardiac computed tomography (CT) is a painless test that uses an x-ray machine to take clear, detailed pictures of your heart. For further information please visit [email or web address]. Please follow instruction sheet as given.   Your cardiac CT will be scheduled at one of the below locations:   [facility] [address] [place], [place] [address] [phone]  PLEASE ARRIVE at [facility], Friday, 02/08/2020 AT 10:15,  please arrive at the [facility] main entrance of [facility] 30 minutes prior to test start time. Proceed to the [facility] Radiology Department (first floor) to check-in and test prep.   Please follow these instructions carefully (unless otherwise directed):   On the Night Before the Test: . Be sure to Drink plenty of water. . Do not consume any caffeinated/decaffeinated beverages or chocolate 12 hours prior to your test. . Do not take any antihistamines 12 hours prior to your test.   On the Day of the Test: . Drink plenty of water. Do not drink any water within one hour of the test. . Do not eat any food 4 hours prior to the test. . You may take your regular medications prior to the test.  . FEMALES- please wear underwire-free bra if  available       After the Test: . Drink plenty of water. . After receiving IV contrast, you may experience a mild flushed feeling. This is normal. . On occasion, you may experience a mild rash up to 24 hours after the test. This is not dangerous. If this occurs, you can take Benadryl 25 mg and increase your fluid intake. . If you experience trouble breathing, this can be serious. If it is severe call 911 IMMEDIATELY. If it is mild, please call our office. . If you take any of these medications: Glipizide/Metformin, Avandament, Glucavance, please do not take 48 hours after completing test unless otherwise instructed.   Once we have confirmed authorization from your insurance company, we will call you to set up a date and time for your test.   For non-scheduling related questions, please contact the cardiac imaging nurse navigator should you have any questions/concerns: Marchia Bond, RN Navigator Cardiac Imaging [facility] [phone] office  For scheduling needs, including cancellations and rescheduling, please call [phone].        Follow-Up: At [facility], you and your health needs are our priority.  As part of our continuing mission to provide you with exceptional heart care, we have created designated Provider Care Teams.  These Care Teams include your primary Cardiologist (physician) and Advanced Practice Providers (APPs -  Physician Assistants and Nurse Practitioners) who all work together to provide you with the care you need, when you need it.  We recommend signing  up for the patient portal called "MyChart".  Sign up information is provided on this After Visit Summary.  MyChart is used to connect with patients for Virtual Visits (Telemedicine).  Patients are able to view lab/test results, encounter notes, upcoming appointments, etc.  Non-urgent messages can be sent to your provider as well.   To learn more about what you can do with MyChart,  go to [email or web address].    Your next appointment:   02/21/2020 ARRIVE AT 11:15 TO SEE DR. ALLRED    Other Instructions You need to hold off on having the Colonoscopy / EGD yet.  We will let you know

## 2020-02-06 DIAGNOSIS — R1013 Epigastric pain: Secondary | ICD-10-CM | POA: Diagnosis not present

## 2020-02-06 DIAGNOSIS — R079 Chest pain, unspecified: Secondary | ICD-10-CM | POA: Diagnosis not present

## 2020-02-06 LAB — BASIC METABOLIC PANEL
BUN/Creatinine Ratio: 19 (ref 12–28)
BUN: 16 mg/dL (ref 8–27)
CO2: 21 mmol/L (ref 20–29)
Calcium: 9.4 mg/dL (ref 8.7–10.3)
Chloride: 98 mmol/L (ref 96–106)
Creatinine, Ser: 0.83 mg/dL (ref 0.57–1.00)
GFR calc Af Amer: 81 mL/min/{1.73_m2} (ref >59)
GFR calc non Af Amer: 71 mL/min/{1.73_m2} (ref >59)
Glucose: 122 mg/dL — ABNORMAL HIGH (ref 65–99)
Potassium: 4.9 mmol/L (ref 3.5–5.2)
Sodium: 134 mmol/L (ref 134–144)

## 2020-02-07 ENCOUNTER — Telehealth (HOSPITAL_COMMUNITY): Payer: Self-pay | Admitting: Emergency Medicine

## 2020-02-07 NOTE — Telephone Encounter (Signed)
Reaching out to patient to offer assistance regarding upcoming cardiac imaging study; pt verbalizes understanding of appt date/time, parking situation and where to check in, pre-test NPO status and medications ordered, and verified current allergies; name and call back number provided for further questions should they arise Davonda Ausley RN Navigator Cardiac Imaging [facility] [phone] office [phone] cell 

## 2020-02-08 ENCOUNTER — Other Ambulatory Visit: Payer: Self-pay

## 2020-02-08 ENCOUNTER — Ambulatory Visit (HOSPITAL_COMMUNITY)
Admission: RE | Admit: 2020-02-08 | Discharge: 2020-02-08 | Disposition: A | Discharge status: Home / Self Care | Payer: Medicare Other | Source: Ambulatory Visit | Attending: Cardiology | Admitting: Cardiology

## 2020-02-08 DIAGNOSIS — I10 Essential (primary) hypertension: Secondary | ICD-10-CM

## 2020-02-08 DIAGNOSIS — I200 Unstable angina: Secondary | ICD-10-CM | POA: Diagnosis not present

## 2020-02-08 DIAGNOSIS — R079 Chest pain, unspecified: Secondary | ICD-10-CM

## 2020-02-08 DIAGNOSIS — I2510 Atherosclerotic heart disease of native coronary artery without angina pectoris: Secondary | ICD-10-CM | POA: Diagnosis not present

## 2020-02-08 DIAGNOSIS — R0789 Other chest pain: Secondary | ICD-10-CM | POA: Diagnosis not present

## 2020-02-08 DIAGNOSIS — R001 Bradycardia, unspecified: Secondary | ICD-10-CM

## 2020-02-08 DIAGNOSIS — R9431 Abnormal electrocardiogram [ECG] [EKG]: Secondary | ICD-10-CM | POA: Insufficient documentation

## 2020-02-08 MED ORDER — IOHEXOL 350 MG/ML SOLN
100.0000 mL | Freq: Once | INTRAVENOUS | Status: AC | PRN
  Administered 2020-02-08: 100 mL via INTRAVENOUS

## 2020-02-08 MED ORDER — NITROGLYCERIN 0.4 MG SL SUBL
SUBLINGUAL_TABLET | SUBLINGUAL | Status: AC
  Filled 2020-02-08: qty 2

## 2020-02-08 MED ORDER — NITROGLYCERIN 0.4 MG SL SUBL
0.8000 mg | SUBLINGUAL_TABLET | Freq: Once | SUBLINGUAL | Status: AC
  Administered 2020-02-08: 12:00:00 0.8 mg via SUBLINGUAL

## 2020-02-18 ENCOUNTER — Telehealth: Payer: Self-pay | Admitting: Internal Medicine

## 2020-02-18 NOTE — Telephone Encounter (Signed)
New message   Patient states that he husband will be coming with her to the office visit with Dr. [name]. Patient states that she gets dizzy. Please call.

## 2020-02-18 NOTE — Telephone Encounter (Signed)
Spoke to the patient who states she has balance issues and gets dizzy often so she would like for her spouse to accompany her to her appt with Dr. [name] so that he can assist her.

## 2020-02-21 ENCOUNTER — Encounter: Payer: Self-pay | Admitting: Internal Medicine

## 2020-02-21 ENCOUNTER — Ambulatory Visit (INDEPENDENT_AMBULATORY_CARE_PROVIDER_SITE_OTHER): Payer: Medicare Other | Admitting: Internal Medicine

## 2020-02-21 ENCOUNTER — Other Ambulatory Visit: Payer: Self-pay

## 2020-02-21 VITALS — BP 164/72 | HR 54 | Ht 64.5 in | Wt 183.6 lb

## 2020-02-21 DIAGNOSIS — R0789 Other chest pain: Secondary | ICD-10-CM | POA: Diagnosis not present

## 2020-02-21 DIAGNOSIS — I119 Hypertensive heart disease without heart failure: Secondary | ICD-10-CM | POA: Diagnosis not present

## 2020-02-21 DIAGNOSIS — R001 Bradycardia, unspecified: Secondary | ICD-10-CM | POA: Diagnosis not present

## 2020-02-21 DIAGNOSIS — I200 Unstable angina: Secondary | ICD-10-CM

## 2020-02-21 LAB — CBC WITH DIFFERENTIAL/PLATELET
Basophils Absolute: 0 10*3/uL (ref 0.0–0.2)
Basos: 1 %
EOS (ABSOLUTE): 0.2 10*3/uL (ref 0.0–0.4)
Eos: 4 %
Hematocrit: 43.3 % (ref 34.0–46.6)
Hemoglobin: 14.3 g/dL (ref 11.1–15.9)
Immature Grans (Abs): 0 10*3/uL (ref 0.0–0.1)
Immature Granulocytes: 0 %
Lymphocytes Absolute: 1.8 10*3/uL (ref 0.7–3.1)
Lymphs: 27 %
MCH: 29.4 pg (ref 26.6–33.0)
MCHC: 33 g/dL (ref 31.5–35.7)
MCV: 89 fL (ref 79–97)
Monocytes Absolute: 0.6 10*3/uL (ref 0.1–0.9)
Monocytes: 9 %
Neutrophils Absolute: 3.9 10*3/uL (ref 1.4–7.0)
Neutrophils: 59 %
Platelets: 248 10*3/uL (ref 150–450)
RBC: 4.87 x10E6/uL (ref 3.77–5.28)
RDW: 12.9 % (ref 11.7–15.4)
WBC: 6.6 10*3/uL (ref 3.4–10.8)

## 2020-02-21 LAB — BASIC METABOLIC PANEL
BUN/Creatinine Ratio: 21 (ref 12–28)
BUN: 18 mg/dL (ref 8–27)
CO2: 22 mmol/L (ref 20–29)
Calcium: 9.3 mg/dL (ref 8.7–10.3)
Chloride: 101 mmol/L (ref 96–106)
Creatinine, Ser: 0.85 mg/dL (ref 0.57–1.00)
GFR calc Af Amer: 79 mL/min/{1.73_m2} (ref >59)
GFR calc non Af Amer: 69 mL/min/{1.73_m2} (ref >59)
Glucose: 149 mg/dL — ABNORMAL HIGH (ref 65–99)
Potassium: 4.2 mmol/L (ref 3.5–5.2)
Sodium: 135 mmol/L (ref 134–144)

## 2020-02-21 NOTE — Patient Instructions (Addendum)
Medication Instructions:  Your physician recommends that you continue on your current medications as directed. Please refer to the Current Medication list given to you today.  Labwork: You will get lab work today:  BMP and CBC  Testing/Procedures: None ordered.  Follow-Up:  SEE INSTRUCTION LETTER  Any Other Special Instructions Will Be Listed Below (If Applicable).  If you need a refill on your cardiac medications before your next appointment, please call your pharmacy.    Pacemaker Implantation, Adult Pacemaker implantation is a procedure to place a pacemaker inside your chest. A pacemaker is a small computer that sends electrical signals to the heart and helps your heart beat normally. A pacemaker also stores information about your heart rhythms. You may need pacemaker implantation if you:  Have a slow heartbeat (bradycardia).  Faint (syncope).  Have shortness of breath (dyspnea) due to heart problems. The pacemaker attaches to your heart through a wire, called a lead. Sometimes just one lead is needed. Other times, there will be two leads. There are two types of pacemakers:  Transvenous pacemaker. This type is placed under the skin or muscle of your chest. The lead goes through a vein in the chest area to reach the inside of the heart.  Epicardial pacemaker. This type is placed under the skin or muscle of your chest or belly. The lead goes through your chest to the outside of the heart. Tell a health care provider about:  Any allergies you have.  All medicines you are taking, including vitamins, herbs, eye drops, creams, and over-the-counter medicines.  Any problems you or family members have had with anesthetic medicines.  Any blood or bone disorders you have.  Any surgeries you have had.  Any medical conditions you have.  Whether you are pregnant or may be pregnant. What are the risks? Generally, this is a safe procedure. However, problems may occur,  including:  Infection.  Bleeding.  Failure of the pacemaker or the lead.  Collapse of a lung or bleeding into a lung.  Blood clot inside a blood vessel with a lead.  Damage to the heart.  Infection inside the heart (endocarditis).  Allergic reactions to medicines. What happens before the procedure? Staying hydrated Follow instructions from your health care provider about hydration, which may include:  Up to 2 hours before the procedure - you may continue to drink clear liquids, such as water, clear fruit juice, black coffee, and plain tea. Eating and drinking restrictions Follow instructions from your health care provider about eating and drinking, which may include:  8 hours before the procedure - stop eating heavy meals or foods such as meat, fried foods, or fatty foods.  6 hours before the procedure - stop eating light meals or foods, such as toast or cereal.  6 hours before the procedure - stop drinking milk or drinks that contain milk.  2 hours before the procedure - stop drinking clear liquids. Medicines  Ask your health care provider about: ? Changing or stopping your regular medicines. This is especially important if you are taking diabetes medicines or blood thinners. ? Taking medicines such as aspirin and ibuprofen. These medicines can thin your blood. Do not take these medicines before your procedure if your health care provider instructs you not to.  You may be given antibiotic medicine to help prevent infection. General instructions  You will have a heart evaluation. This may include an electrocardiogram (ECG), chest X-ray, and heart imaging (echocardiogram,  or echo) tests.  You will have   blood tests.  Do not use any products that contain nicotine or tobacco, such as cigarettes and e-cigarettes. If you need help quitting, ask your health care provider.  Plan to have someone take you home from the hospital or clinic.  If you will be going home right after  the procedure, plan to have someone with you for 24 hours.  Ask your health care provider how your surgical site will be marked or identified. What happens during the procedure?  To reduce your risk of infection: ? Your health care team will wash or sanitize their hands. ? Your skin will be washed with soap. ? Hair may be removed from the surgical area.  An IV tube will be inserted into one of your veins.  You will be given one or more of the following: ? A medicine to help you relax (sedative). ? A medicine to numb the area (local anesthetic). ? A medicine to make you fall asleep (general anesthetic).  If you are getting a transvenous pacemaker: ? An incision will be made in your upper chest. ? A pocket will be made for the pacemaker. It may be placed under the skin or between layers of muscle. ? The lead will be inserted into a blood vessel that returns to the heart. ? While X-rays are taken by an imaging machine (fluoroscopy), the lead will be advanced through the vein to the inside of your heart. ? The other end of the lead will be tunneled under the skin and attached to the pacemaker.  If you are getting an epicardial pacemaker: ? An incision will be made near your ribs or breastbone (sternum) for the lead. ? The lead will be attached to the outside of your heart. ? Another incision will be made in your chest or upper belly to create a pocket for the pacemaker. ? The free end of the lead will be tunneled under the skin and attached to the pacemaker.  The transvenous or epicardial pacemaker will be tested. Imaging studies may be done to check the lead position.  The incisions will be closed with stitches (sutures), adhesive strips, or skin glue.  Bandages (dressing) will be placed over the incisions. The procedure may vary among health care providers and hospitals. What happens after the procedure?  Your blood pressure, heart rate, breathing rate, and blood oxygen level will  be monitored until the medicines you were given have worn off.  You will be given antibiotics and pain medicine.  ECG and chest x-rays will be done.  You will wear a continuous type of ECG (Holter monitor) to check your heart rhythm.  Your health care provider will program the pacemaker.  Do not drive for 24 hours if you received a sedative. This information is not intended to replace advice given to you by your health care provider. Make sure you discuss any questions you have with your health care provider. Document Revised: 07/07/2018 Document Reviewed: 03/31/2016 Elsevier Patient Education  2020 Elsevier Inc.  

## 2020-02-21 NOTE — Progress Notes (Signed)
PCP: Shirline Frees, MD Primary Cardiologist: Dr Marlou Porch Primary EP: Dr [name]  [name] is a 74 y.o. female who presents today for routine electrophysiology followup.  Since last being seen in our clinic, the patient reports doing reasonably well.  She has recently been hospitalized with GI symptoms and UTI.  She has frequent sinus bradycardia with ongoing fatigue.  She is not very active.  Today, she denies symptoms of palpitations, chest pain, shortness of breath,  lower extremity edema, presyncope, or syncope.  The patient is otherwise without complaint today.   Past Medical History:  Diagnosis Date  . Acute meniscal tear of knee   . Basal cell carcinoma   . Colon polyp   . Diabetes ([place])   . Diverticulosis   . Edema   . Esophageal reflux   . Fibrocystic breast   . Hemorrhoids   . HTN (hypertension)   . Hyperlipidemia   . Hyponatremia   . Hypothyroidism   . OA (osteoarthritis) of hip   . Seasonal allergies   . Sinus bradycardia   . Syncope    vagal,  in the setting of GI cramping   Past Surgical History:  Procedure Laterality Date  . CHOLECYSTECTOMY    . TONSILLECTOMY    . tubal ligation      ROS- all systems are reviewed and negatives except as per HPI above  Current Outpatient Medications  Medication Sig Dispense Refill  . amLODipine (NORVASC) 5 MG tablet Take 5 mg by mouth daily.    Marland Kitchen aspirin 81 MG tablet Take 81 mg by mouth See admin instructions. Take 81 mg by mouth at bedtime on Tues/Thurs/Sat    . esomeprazole (NEXIUM) 40 MG capsule Take 40 mg by mouth daily.    . fluticasone (FLONASE) 50 MCG/ACT nasal spray Place 2 sprays into both nostrils daily as needed for allergies or rhinitis.     Marland Kitchen levothyroxine (SYNTHROID, LEVOTHROID) 50 MCG tablet Take 50 mcg by mouth daily before breakfast.    . losartan (COZAAR) 50 MG tablet Take 100 mg by mouth daily.    . rosuvastatin (CRESTOR) 5 MG tablet Take 5 mg by mouth daily.     No current facility-administered  medications for this visit.    Physical Exam: Vitals:   02/21/20 0809  BP: (!) 164/72  Pulse: (!) 54  SpO2: 98%  Weight: 183 lb 9.6 oz (83.3 kg)  Height: 5' 4.5" (1.638 m)    GEN- The patient appears older than her stated age, alert and oriented x 3 today.   Head- normocephalic, atraumatic Eyes-  Sclera clear, conjunctiva pink Ears- hearing intact Oropharynx- clear Lungs-   normal work of breathing Heart- bradycardic regular rhythm GI- soft  Extremities- +edema  Wt Readings from Last 3 Encounters:  02/21/20 183 lb 9.6 oz (83.3 kg)  02/05/20 181 lb (82.1 kg)  10/18/19 176 lb 14.4 oz (80.2 kg)    EKG tracing ordered today is personally reviewed and shows sinus bradycardia 54 bpm< PR 154 msec, QRS 104 msec, Qtc 455 msec  Assessment and Plan:  1. Sick sinus syndrome The patient has symptomatic sinus bradycardia with previously documented sinus rates in the 30s and 40s.  No reversible causes have been found. I would therefore recommend pacemaker implantation at this time.  Risks, benefits, alternatives to pacemaker implantation were discussed in detail with the patient today. The patient understands that the risks include but are not limited to bleeding, infection, pneumothorax, perforation, tamponade, vascular damage, renal failure,  MI, stroke, death,  and lead dislodgement and wishes to proceed. We will therefore schedule the procedure at the next available time.     2. Atypical chest pain Cardiac Ct is normal  3. Hypertensive cardiovascular disease  BP elevated today Reassess after PPM implant  [name] MD, [facility] 02/21/2020 8:32 AM

## 2020-02-21 NOTE — H&P (View-Only) (Signed)
PCP: Shirline Frees, MD Primary Cardiologist: Dr Marlou Porch Primary EP: Dr [name]  [name] is a 74 y.o. female who presents today for routine electrophysiology followup.  Since last being seen in our clinic, the patient reports doing reasonably well.  She has recently been hospitalized with GI symptoms and UTI.  She has frequent sinus bradycardia with ongoing fatigue.  She is not very active.  Today, she denies symptoms of palpitations, chest pain, shortness of breath,  lower extremity edema, presyncope, or syncope.  The patient is otherwise without complaint today.   Past Medical History:  Diagnosis Date  . Acute meniscal tear of knee   . Basal cell carcinoma   . Colon polyp   . Diabetes ([place])   . Diverticulosis   . Edema   . Esophageal reflux   . Fibrocystic breast   . Hemorrhoids   . HTN (hypertension)   . Hyperlipidemia   . Hyponatremia   . Hypothyroidism   . OA (osteoarthritis) of hip   . Seasonal allergies   . Sinus bradycardia   . Syncope    vagal,  in the setting of GI cramping   Past Surgical History:  Procedure Laterality Date  . CHOLECYSTECTOMY    . TONSILLECTOMY    . tubal ligation      ROS- all systems are reviewed and negatives except as per HPI above  Current Outpatient Medications  Medication Sig Dispense Refill  . amLODipine (NORVASC) 5 MG tablet Take 5 mg by mouth daily.    Marland Kitchen aspirin 81 MG tablet Take 81 mg by mouth See admin instructions. Take 81 mg by mouth at bedtime on Tues/Thurs/Sat    . esomeprazole (NEXIUM) 40 MG capsule Take 40 mg by mouth daily.    . fluticasone (FLONASE) 50 MCG/ACT nasal spray Place 2 sprays into both nostrils daily as needed for allergies or rhinitis.     Marland Kitchen levothyroxine (SYNTHROID, LEVOTHROID) 50 MCG tablet Take 50 mcg by mouth daily before breakfast.    . losartan (COZAAR) 50 MG tablet Take 100 mg by mouth daily.    . rosuvastatin (CRESTOR) 5 MG tablet Take 5 mg by mouth daily.     No current facility-administered  medications for this visit.    Physical Exam: Vitals:   02/21/20 0809  BP: (!) 164/72  Pulse: (!) 54  SpO2: 98%  Weight: 183 lb 9.6 oz (83.3 kg)  Height: 5' 4.5" (1.638 m)    GEN- The patient appears older than her stated age, alert and oriented x 3 today.   Head- normocephalic, atraumatic Eyes-  Sclera clear, conjunctiva pink Ears- hearing intact Oropharynx- clear Lungs-   normal work of breathing Heart- bradycardic regular rhythm GI- soft  Extremities- +edema  Wt Readings from Last 3 Encounters:  02/21/20 183 lb 9.6 oz (83.3 kg)  02/05/20 181 lb (82.1 kg)  10/18/19 176 lb 14.4 oz (80.2 kg)    EKG tracing ordered today is personally reviewed and shows sinus bradycardia 54 bpm< PR 154 msec, QRS 104 msec, Qtc 455 msec  Assessment and Plan:  1. Sick sinus syndrome The patient has symptomatic sinus bradycardia with previously documented sinus rates in the 30s and 40s.  No reversible causes have been found. I would therefore recommend pacemaker implantation at this time.  Risks, benefits, alternatives to pacemaker implantation were discussed in detail with the patient today. The patient understands that the risks include but are not limited to bleeding, infection, pneumothorax, perforation, tamponade, vascular damage, renal failure,  MI, stroke, death,  and lead dislodgement and wishes to proceed. We will therefore schedule the procedure at the next available time.     2. Atypical chest pain Cardiac Ct is normal  3. Hypertensive cardiovascular disease  BP elevated today Reassess after PPM implant  [name] MD, [facility] 02/21/2020 8:32 AM

## 2020-02-25 ENCOUNTER — Telehealth: Payer: Self-pay | Admitting: Internal Medicine

## 2020-02-25 NOTE — Telephone Encounter (Signed)
Per Pt-son tested positive for covid on 02/23/20.  Son lives with Pt.  Pt and family have to quarantine for 10-14 days.  Pt's pacemaker is scheduled for 5/6-which is 13 days after son testing positive.  Pt is scheduled for covid test on 5/3.  Advised would discuss with Dr. [name] and call Pt back.

## 2020-02-25 NOTE — Telephone Encounter (Signed)
Returned call to pt.  Advised as long as Pt not symptomatic and tests negative for covid on May 3 Pt could have procedure as scheduled.  Pt has been vaccinated.  Pt indicates understanding-thanked nurse for call.

## 2020-02-25 NOTE — Telephone Encounter (Signed)
[name] is calling requesting [name] give her a call in regards to her getting a pacemaker put in. Please advise.

## 2020-03-03 ENCOUNTER — Other Ambulatory Visit (HOSPITAL_COMMUNITY)
Admission: RE | Admit: 2020-03-03 | Discharge: 2020-03-03 | Disposition: A | Discharge status: Home / Self Care | Payer: Medicare Other | Source: Ambulatory Visit | Attending: Internal Medicine | Admitting: Internal Medicine

## 2020-03-03 DIAGNOSIS — Z20822 Contact with and (suspected) exposure to covid-19: Secondary | ICD-10-CM | POA: Diagnosis not present

## 2020-03-03 DIAGNOSIS — Z01812 Encounter for preprocedural laboratory examination: Secondary | ICD-10-CM | POA: Diagnosis not present

## 2020-03-03 LAB — SARS CORONAVIRUS 2 (TAT 6-24 HRS): SARS Coronavirus 2: NEGATIVE

## 2020-03-05 ENCOUNTER — Telehealth: Payer: Self-pay

## 2020-03-05 NOTE — Telephone Encounter (Signed)
Patient is calling to follow up in regards to procedure. She states she is requesting to speak with Anderson Malta. Please call.

## 2020-03-05 NOTE — Telephone Encounter (Signed)
Returned call to Pt.  Last minute concerns addressed.  Pt tested negative for covid.

## 2020-03-06 ENCOUNTER — Ambulatory Visit (HOSPITAL_COMMUNITY): Payer: Medicare Other

## 2020-03-06 ENCOUNTER — Other Ambulatory Visit: Payer: Self-pay

## 2020-03-06 ENCOUNTER — Ambulatory Visit (HOSPITAL_COMMUNITY)
Admission: RE | Disposition: A | Discharge status: Home / Self Care | Payer: Self-pay | Source: Home / Self Care | Attending: Internal Medicine

## 2020-03-06 ENCOUNTER — Ambulatory Visit (HOSPITAL_COMMUNITY)
Admission: RE | Admit: 2020-03-06 | Discharge: 2020-03-06 | Disposition: A | Discharge status: Home / Self Care | Payer: Medicare Other | Attending: Internal Medicine | Admitting: Internal Medicine

## 2020-03-06 DIAGNOSIS — E785 Hyperlipidemia, unspecified: Secondary | ICD-10-CM | POA: Diagnosis not present

## 2020-03-06 DIAGNOSIS — Z79890 Hormone replacement therapy (postmenopausal): Secondary | ICD-10-CM | POA: Diagnosis not present

## 2020-03-06 DIAGNOSIS — Z79899 Other long term (current) drug therapy: Secondary | ICD-10-CM | POA: Insufficient documentation

## 2020-03-06 DIAGNOSIS — M179 Osteoarthritis of knee, unspecified: Secondary | ICD-10-CM | POA: Diagnosis not present

## 2020-03-06 DIAGNOSIS — K219 Gastro-esophageal reflux disease without esophagitis: Secondary | ICD-10-CM | POA: Diagnosis not present

## 2020-03-06 DIAGNOSIS — I119 Hypertensive heart disease without heart failure: Secondary | ICD-10-CM | POA: Diagnosis not present

## 2020-03-06 DIAGNOSIS — Z950 Presence of cardiac pacemaker: Secondary | ICD-10-CM | POA: Diagnosis not present

## 2020-03-06 DIAGNOSIS — Z7982 Long term (current) use of aspirin: Secondary | ICD-10-CM | POA: Diagnosis not present

## 2020-03-06 DIAGNOSIS — I495 Sick sinus syndrome: Secondary | ICD-10-CM | POA: Diagnosis not present

## 2020-03-06 DIAGNOSIS — E039 Hypothyroidism, unspecified: Secondary | ICD-10-CM | POA: Insufficient documentation

## 2020-03-06 DIAGNOSIS — E119 Type 2 diabetes mellitus without complications: Secondary | ICD-10-CM | POA: Diagnosis not present

## 2020-03-06 HISTORY — PX: PACEMAKER IMPLANT: EP1218

## 2020-03-06 LAB — GLUCOSE, CAPILLARY: Glucose-Capillary: 116 mg/dL — ABNORMAL HIGH (ref 70–99)

## 2020-03-06 SURGERY — PACEMAKER IMPLANT

## 2020-03-06 MED ORDER — VANCOMYCIN HCL IN DEXTROSE 1-5 GM/200ML-% IV SOLN
1000.0000 mg | INTRAVENOUS | Status: AC
  Administered 2020-03-06: 1000 mg via INTRAVENOUS

## 2020-03-06 MED ORDER — SODIUM CHLORIDE 0.9% FLUSH: 3.0000 mL | INTRAVENOUS | Status: DC | PRN

## 2020-03-06 MED ORDER — SODIUM CHLORIDE 0.9 % IV SOLN
INTRAVENOUS | Status: AC
  Filled 2020-03-06: qty 2

## 2020-03-06 MED ORDER — ONDANSETRON HCL 4 MG/2ML IJ SOLN: 4.0000 mg | Freq: Four times a day (QID) | INTRAMUSCULAR | Status: DC | PRN

## 2020-03-06 MED ORDER — SODIUM CHLORIDE 0.9 % IV SOLN: 250.0000 mL | INTRAVENOUS | Status: DC | PRN

## 2020-03-06 MED ORDER — LIDOCAINE HCL (PF) 1 % IJ SOLN
INTRAMUSCULAR | Status: DC | PRN
  Administered 2020-03-06: 45 mL

## 2020-03-06 MED ORDER — MIDAZOLAM HCL 5 MG/5ML IJ SOLN
INTRAMUSCULAR | Status: DC | PRN
  Administered 2020-03-06 (×2): 1 mg via INTRAVENOUS

## 2020-03-06 MED ORDER — HEPARIN (PORCINE) IN NACL 1000-0.9 UT/500ML-% IV SOLN
INTRAVENOUS | Status: DC | PRN
  Administered 2020-03-06: 500 mL

## 2020-03-06 MED ORDER — VANCOMYCIN HCL IN DEXTROSE 1-5 GM/200ML-% IV SOLN
INTRAVENOUS | Status: AC
  Filled 2020-03-06: qty 200

## 2020-03-06 MED ORDER — MIDAZOLAM HCL 5 MG/5ML IJ SOLN
INTRAMUSCULAR | Status: AC
  Filled 2020-03-06: qty 5

## 2020-03-06 MED ORDER — CHLORHEXIDINE GLUCONATE 4 % EX LIQD
4.0000 "application " | Freq: Once | CUTANEOUS | Status: DC
  Filled 2020-03-06: qty 60

## 2020-03-06 MED ORDER — CEFAZOLIN SODIUM-DEXTROSE 2-4 GM/100ML-% IV SOLN
INTRAVENOUS | Status: AC
  Filled 2020-03-06: qty 100

## 2020-03-06 MED ORDER — HEPARIN (PORCINE) IN NACL 1000-0.9 UT/500ML-% IV SOLN
INTRAVENOUS | Status: AC
  Filled 2020-03-06: qty 500

## 2020-03-06 MED ORDER — SODIUM CHLORIDE 0.9 % IV SOLN
80.0000 mg | INTRAVENOUS | Status: AC
  Administered 2020-03-06: 80 mg

## 2020-03-06 MED ORDER — SODIUM CHLORIDE 0.9% FLUSH: 3.0000 mL | Freq: Two times a day (BID) | INTRAVENOUS | Status: DC

## 2020-03-06 MED ORDER — ACETAMINOPHEN 325 MG PO TABS
325.0000 mg | ORAL_TABLET | ORAL | Status: DC | PRN
  Administered 2020-03-06: 650 mg via ORAL
  Filled 2020-03-06: qty 2

## 2020-03-06 MED ORDER — ACETAMINOPHEN 325 MG PO TABS
ORAL_TABLET | ORAL | Status: AC
  Filled 2020-03-06: qty 2

## 2020-03-06 MED ORDER — LIDOCAINE HCL (PF) 1 % IJ SOLN
INTRAMUSCULAR | Status: AC
  Filled 2020-03-06: qty 60

## 2020-03-06 MED ORDER — SODIUM CHLORIDE 0.9 % IV SOLN: INTRAVENOUS | Status: DC

## 2020-03-06 MED ORDER — IOHEXOL 350 MG/ML SOLN
INTRAVENOUS | Status: DC | PRN
  Administered 2020-03-06: 15 mL

## 2020-03-06 SURGICAL SUPPLY — 7 items
CABLE SURGICAL S-101-97-12 (CABLE) ×3 IMPLANT
LEAD TENDRIL MRI 46CM LPA1200M (Lead) ×2 IMPLANT
LEAD TENDRIL MRI 58CM LPA1200M (Lead) ×2 IMPLANT
PACEMAKER ASSURITY DR-RF (Pacemaker) ×2 IMPLANT
PAD PRO RADIOLUCENT 2001M-C (PAD) ×3 IMPLANT
SHEATH 8FR PRELUDE SNAP 13 (SHEATH) ×4 IMPLANT
TRAY PACEMAKER INSERTION (PACKS) ×3 IMPLANT

## 2020-03-06 NOTE — Progress Notes (Signed)
Dr Allred in and ok to d/c home 

## 2020-03-06 NOTE — Progress Notes (Signed)
Pt gone for CXR in bed with radiology.

## 2020-03-06 NOTE — Discharge Instructions (Signed)
After Your Pacemaker   . You have a [employer] Pacemaker  ACTIVITY . Do not lift your arm above shoulder height for 1 week after your procedure. After 7 days, you may progress as below.     Thursday Mar 13, 2020  Friday Mar 14, 2020 Saturday Mar 15, 2020 Sunday Mar 16, 2020   . Do not lift, push, pull, or carry anything over 10 pounds with the affected arm until 6 weeks (Thursday April 17, 2020 ) after your procedure.   . Do NOT DRIVE until you have been seen for your wound check, or as long as instructed by your healthcare provider.   . Ask your healthcare provider when you can go back to work   INCISION/Dressing .   Marland Kitchen Monitor your Pacemaker site for redness, swelling, and drainage. Call the device clinic at [phone] if you experience these symptoms or fever/chills.  . You have an outer dressing that is to be removed in 24 hous. Leave the steri strips/ look like small pieces of tape, in place, they will fall off in 7-10 days on their own,   .  . Take arm out of sling in 24 hours  . Avoid lotions, ointments, or perfumes over your incision until it is well-healed.  . You may use a hot tub or a pool AFTER your wound check appointment if the incision is completely closed.  Marland Kitchen PAcemaker Alerts:  Some alerts are vibratory and others beep. These are NOT emergencies. Please call our office to let us know. If this occurs at night or on weekends, it can wait until the next business day. Send a remote transmission.  . If your device is capable of reading fluid status (for heart failure), you will be offered monthly monitoring to review this with you.   DEVICE MANAGEMENT . Remote monitoring is used to monitor your pacemaker from home. This monitoring is scheduled every 91 days by our office. It allows [place] to keep an eye on the functioning of your device to ensure it is working properly. You will routinely see your Electrophysiologist annually (more often if necessary).   . You should receive  your ID card for your new device in 4-8 weeks. Keep this card with you at all times once received. Consider wearing a medical alert bracelet or necklace.  . Your Pacemaker may be MRI compatible. This will be discussed at your next office visit/wound check.  You should avoid contact with strong electric or magnetic fields.    Do not use amateur (ham) radio equipment or electric (arc) welding torches. MP3 player headphones with magnets should not be used. Some devices are safe to use if held at least 12 inches (30 cm) from your Pacemaker. These include power tools, lawn mowers, and speakers. If you are unsure if something is safe to use, ask your health care provider.   When using your cell phone, hold it to the ear that is on the opposite side from the Pacemaker. Do not leave your cell phone in a pocket over the Pacemaker.   You may safely use electric blankets, heating pads, computers, and microwave ovens.  Call the office right away if:  You have chest pain.  You feel more short of breath than you have felt before.  You feel more light-headed than you have felt before.  Your incision starts to open up.  This information is not intended to replace advice given to you by your health care provider. Make sure you  discuss any questions you have with your health care provider.

## 2020-03-06 NOTE — Interval H&P Note (Signed)
History and Physical Interval Note:  03/06/2020 1:06 PM  [name]  has presented today for surgery, with the diagnosis of Bradycardia.  The various methods of treatment have been discussed with the patient and family. After consideration of risks, benefits and other options for treatment, the patient has consented to  Procedure(s): PACEMAKER IMPLANT (N/A) as a surgical intervention.  The patient's history has been reviewed, patient examined, no change in status, stable for surgery.  I have reviewed the patient's chart and labs.  Questions were answered to the patient's satisfaction.     [name]

## 2020-03-07 ENCOUNTER — Telehealth: Payer: Self-pay

## 2020-03-07 MED FILL — Cefazolin Sodium-Dextrose IV Solution 2 GM/100ML-4%: INTRAVENOUS | Qty: 100 | Status: AC

## 2020-03-07 NOTE — Telephone Encounter (Signed)
Called patient to assess for next day pacer implant. Merlin transmission received, presenting AP/VS 33. Impedance, sensing and threshold values are stable for available data provided. Patient restrictions reviewed with verbalizes understanding. Patient education provided on [facility] home transmitter. Educated if she noted increased swelling, bleeding, drainage, fever or chills to call office, if on a weekend go to the closest emergency department, Patient verbalizes understanding. Patient advised of she has any questions or concerns to please call DC at [phone], verbalizes understanding.

## 2020-03-10 ENCOUNTER — Telehealth: Payer: Self-pay | Admitting: Internal Medicine

## 2020-03-10 NOTE — Telephone Encounter (Signed)
Returned call to Pt.  She is having increased pain in her arm pit, with left arm pain and tingling in her fingers.  Advised I would have device clinic call her to discuss.

## 2020-03-10 NOTE — Telephone Encounter (Signed)
[name] is calling stating she had surgery Thursday 03/06/20 and she has questions regarding it that she would like to ask [place]. Please advise.

## 2020-03-10 NOTE — Telephone Encounter (Signed)
Returned patients call regarding post-op questions. Patient complains about left arm (axillary) pain since implant and pain has gradually worsened. Reports of now tingling in left hand. Patient reports of previous hx of left am pain with other known issues. Patient denies any gross amount of swelling at device, drainage, bleeding, warm to touch, redness, fever or chills. Patient unable to send picture so offered DC apt. For 03/11/20 at 4:30 PM. Advised patient to call DC back with any questions or concerns. Direct phone number provided.

## 2020-03-11 ENCOUNTER — Other Ambulatory Visit: Payer: Self-pay

## 2020-03-11 ENCOUNTER — Ambulatory Visit (INDEPENDENT_AMBULATORY_CARE_PROVIDER_SITE_OTHER): Payer: Medicare Other | Admitting: Emergency Medicine

## 2020-03-11 DIAGNOSIS — R001 Bradycardia, unspecified: Secondary | ICD-10-CM | POA: Diagnosis not present

## 2020-03-11 NOTE — Patient Instructions (Signed)
Call office on Thursday if pain does not decrease or becomes greater. Take Tylenol for pain every 6 hours. Apply ice pack to pacemaker site for 20 minutes every 4 hours while awake.  [facility] [phone]

## 2020-03-18 ENCOUNTER — Other Ambulatory Visit: Payer: Self-pay

## 2020-03-18 ENCOUNTER — Ambulatory Visit (INDEPENDENT_AMBULATORY_CARE_PROVIDER_SITE_OTHER): Payer: Medicare Other | Admitting: Emergency Medicine

## 2020-03-18 DIAGNOSIS — R001 Bradycardia, unspecified: Secondary | ICD-10-CM

## 2020-03-18 LAB — CUP PACEART INCLINIC DEVICE CHECK
Battery Remaining Longevity: 92 mo
Battery Voltage: 3.08 V
Brady Statistic RA Percent Paced: 97 %
Brady Statistic RV Percent Paced: 0 %
Date Time Interrogation Session: 20210518161449
Implantable Lead Implant Date: 20210506
Implantable Lead Implant Date: 20210506
Implantable Lead Location: 753859
Implantable Lead Location: 753860
Implantable Pulse Generator Implant Date: 20210506
Lead Channel Impedance Value: 650 Ohm
Lead Channel Impedance Value: 712.5 Ohm
Lead Channel Pacing Threshold Amplitude: 0.5 V
Lead Channel Pacing Threshold Amplitude: 0.5 V
Lead Channel Pacing Threshold Pulse Width: 0.5 ms
Lead Channel Pacing Threshold Pulse Width: 0.5 ms
Lead Channel Sensing Intrinsic Amplitude: 12 mV
Lead Channel Sensing Intrinsic Amplitude: 4.1 mV
Lead Channel Setting Pacing Amplitude: 3.5 V
Lead Channel Setting Pacing Amplitude: 3.5 V
Lead Channel Setting Pacing Pulse Width: 0.5 ms
Lead Channel Setting Sensing Sensitivity: 2 mV
Pulse Gen Model: 2272
Pulse Gen Serial Number: 3825689

## 2020-03-18 NOTE — Patient Instructions (Signed)
Dr. Caryl Comes advised to apply ice packs intermittently to under arm area that is tender as needed.  Continue taking OTC tylenol as directed.

## 2020-03-18 NOTE — Progress Notes (Signed)
Wound check appointment. Steri-strips removed. Wound without redness or edema. Incision edges approximated, wound well healed. Patient reports intermittent left axillary pain, reproducible with palpation, no appreciable joint stiffness, patient assessed by Dr. Caryl Comes who recommended supportive therapy with ice packs and tylenol.   Normal device function. Thresholds, sensing, and impedances consistent with implant measurements. Device programmed at 3.5V for extra safety margin until 3 month visit. Histogram distribution blunted but appropriate for patient and level of activity. No mode switches or high ventricular rates noted. Patient educated about wound care, arm mobility, lifting restrictions. ROV with Dr. [name] 06/16/20.  Next scheduled remote check 06/05/20.

## 2020-03-18 NOTE — Progress Notes (Signed)
Patient being assessed due to c/o pain at wound site and in left axillary region.Wound site with small amount of edema, no hematoma, bruising visible posterior to incision site. Pain with palpation at incision site and with movement of left arm. Patient reports that the pain has decreased since discharge after implant 03/06/20.Patient reports she has awakened laying on her left side and the pain is more intense in the morning when she wakes up. Education done on Tylenol for pain and ice packs to wound site. Patient instructed to call the office if the pain increases or if she develops increased edema at wound site.

## 2020-06-05 ENCOUNTER — Ambulatory Visit (INDEPENDENT_AMBULATORY_CARE_PROVIDER_SITE_OTHER): Payer: Medicare Other | Admitting: *Deleted

## 2020-06-05 DIAGNOSIS — Z950 Presence of cardiac pacemaker: Secondary | ICD-10-CM | POA: Diagnosis not present

## 2020-06-05 LAB — CUP PACEART REMOTE DEVICE CHECK
Battery Remaining Longevity: 76 mo
Battery Remaining Percentage: 95.5 %
Battery Voltage: 3.01 V
Brady Statistic AP VP Percent: 1 %
Brady Statistic AP VS Percent: 92 %
Brady Statistic AS VP Percent: 0 %
Brady Statistic AS VS Percent: 8.1 %
Brady Statistic RA Percent Paced: 92 %
Brady Statistic RV Percent Paced: 1 %
Date Time Interrogation Session: 20210805040017
Implantable Lead Implant Date: 20210506
Implantable Lead Implant Date: 20210506
Implantable Lead Location: 753859
Implantable Lead Location: 753860
Implantable Pulse Generator Implant Date: 20210506
Lead Channel Impedance Value: 550 Ohm
Lead Channel Impedance Value: 590 Ohm
Lead Channel Pacing Threshold Amplitude: 0.5 V
Lead Channel Pacing Threshold Amplitude: 0.5 V
Lead Channel Pacing Threshold Pulse Width: 0.5 ms
Lead Channel Pacing Threshold Pulse Width: 0.5 ms
Lead Channel Sensing Intrinsic Amplitude: 12 mV
Lead Channel Sensing Intrinsic Amplitude: 2.6 mV
Lead Channel Setting Pacing Amplitude: 3.5 V
Lead Channel Setting Pacing Amplitude: 3.5 V
Lead Channel Setting Pacing Pulse Width: 0.5 ms
Lead Channel Setting Sensing Sensitivity: 2 mV
Pulse Gen Model: 2272
Pulse Gen Serial Number: 3825689

## 2020-06-06 NOTE — Progress Notes (Signed)
Remote pacemaker transmission.   

## 2020-06-10 DIAGNOSIS — E119 Type 2 diabetes mellitus without complications: Secondary | ICD-10-CM | POA: Diagnosis not present

## 2020-06-10 DIAGNOSIS — Z950 Presence of cardiac pacemaker: Secondary | ICD-10-CM | POA: Diagnosis not present

## 2020-06-10 DIAGNOSIS — I10 Essential (primary) hypertension: Secondary | ICD-10-CM | POA: Diagnosis not present

## 2020-06-10 DIAGNOSIS — E7800 Pure hypercholesterolemia, unspecified: Secondary | ICD-10-CM | POA: Diagnosis not present

## 2020-06-10 DIAGNOSIS — K219 Gastro-esophageal reflux disease without esophagitis: Secondary | ICD-10-CM | POA: Diagnosis not present

## 2020-06-10 DIAGNOSIS — E039 Hypothyroidism, unspecified: Secondary | ICD-10-CM | POA: Diagnosis not present

## 2020-06-10 DIAGNOSIS — E871 Hypo-osmolality and hyponatremia: Secondary | ICD-10-CM | POA: Diagnosis not present

## 2020-06-16 ENCOUNTER — Ambulatory Visit (INDEPENDENT_AMBULATORY_CARE_PROVIDER_SITE_OTHER): Payer: Medicare Other | Admitting: Internal Medicine

## 2020-06-16 ENCOUNTER — Other Ambulatory Visit: Payer: Self-pay

## 2020-06-16 ENCOUNTER — Encounter: Payer: Self-pay | Admitting: Internal Medicine

## 2020-06-16 VITALS — BP 152/70 | HR 64 | Ht 65.0 in | Wt 182.0 lb

## 2020-06-16 DIAGNOSIS — I10 Essential (primary) hypertension: Secondary | ICD-10-CM | POA: Diagnosis not present

## 2020-06-16 DIAGNOSIS — Z950 Presence of cardiac pacemaker: Secondary | ICD-10-CM | POA: Diagnosis not present

## 2020-06-16 DIAGNOSIS — R001 Bradycardia, unspecified: Secondary | ICD-10-CM

## 2020-06-16 DIAGNOSIS — I200 Unstable angina: Secondary | ICD-10-CM

## 2020-06-16 NOTE — Patient Instructions (Addendum)
Medication Instructions:  Your physician recommends that you continue on your current medications as directed. Please refer to the Current Medication list given to you today.  *If you need a refill on your cardiac medications before your next appointment, please call your pharmacy*  Lab Work: None ordered.  If you have labs (blood work) drawn today and your tests are completely normal, you will receive your results only by: Marland Kitchen MyChart Message (if you have MyChart) OR . A paper copy in the mail If you have any lab test that is abnormal or we need to change your treatment, we will call you to review the results.  Testing/Procedures: None ordered.  Follow-Up: At [facility], you and your health needs are our priority.  As part of our continuing mission to provide you with exceptional heart care, we have created designated Provider Care Teams.  These Care Teams include your primary Cardiologist (physician) and Advanced Practice Providers (APPs -  Physician Assistants and Nurse Practitioners) who all work together to provide you with the care you need, when you need it.  We recommend signing up for the patient portal called "MyChart".  Sign up information is provided on this After Visit Summary.  MyChart is used to connect with patients for Virtual Visits (Telemedicine).  Patients are able to view lab/test results, encounter notes, upcoming appointments, etc.  Non-urgent messages can be sent to your provider as well.   To learn more about what you can do with MyChart, go to [email or web address].    Your next appointment:   Your physician wants you to follow-up in: 1 year with [name]. You will receive a reminder letter in the mail two months in advance. If you don't receive a letter, please call our office to schedule the follow-up appointment.  Remote monitoring is used to monitor your Pacemaker from home. This monitoring reduces the number of office visits required to check your device  to one time per year. It allows [place] to keep an eye on the functioning of your device to ensure it is working properly. You are scheduled for a device check from home on 09/04/20. You may send your transmission at any time that day. If you have a wireless device, the transmission will be sent automatically. After your physician reviews your transmission, you will receive a postcard with your next transmission date.  Other Instructions:

## 2020-06-16 NOTE — Progress Notes (Signed)
PCP: Shirline Frees, MD Primary Cardiologist: Dr Marlou Porch Primary EP:  Dr [name]  [name] is a 74 y.o. female who presents today for routine electrophysiology followup.  Since her pacemaker implant, the patient reports doing very well.  Today, she denies symptoms of palpitations, chest pain, shortness of breath,  lower extremity edema, dizziness, presyncope, or syncope.  The patient is otherwise without complaint today.   Past Medical History:  Diagnosis Date  . Acute meniscal tear of knee   . Basal cell carcinoma   . Colon polyp   . Diabetes ([place])   . Diverticulosis   . Edema   . Esophageal reflux   . Fibrocystic breast   . Hemorrhoids   . HTN (hypertension)   . Hyperlipidemia   . Hyponatremia   . Hypothyroidism   . OA (osteoarthritis) of hip   . Seasonal allergies   . Sinus bradycardia   . Syncope    vagal,  in the setting of GI cramping   Past Surgical History:  Procedure Laterality Date  . CHOLECYSTECTOMY    . PACEMAKER IMPLANT N/A 03/06/2020   Procedure: PACEMAKER IMPLANT;  Surgeon: [name], MD;  Location: [facility];  Service: Cardiovascular;  Laterality: N/A;  . TONSILLECTOMY    . tubal ligation      ROS- all systems are reviewed and negative except as per HPI above  Current Outpatient Medications  Medication Sig Dispense Refill  . amLODipine (NORVASC) 5 MG tablet Take 5 mg by mouth daily.    Marland Kitchen aspirin 81 MG tablet Take 81 mg by mouth daily.     . cholecalciferol (VITAMIN D3) 25 MCG (1000 UNIT) tablet Take 1,000 Units by mouth daily.    Marland Kitchen esomeprazole (NEXIUM) 40 MG capsule Take 40 mg by mouth 2 (two) times daily before a meal.     . fluticasone (FLONASE) 50 MCG/ACT nasal spray Place 2 sprays into both nostrils daily as needed for allergies or rhinitis.     Marland Kitchen levothyroxine (SYNTHROID, LEVOTHROID) 50 MCG tablet Take 50 mcg by mouth daily before breakfast.    . losartan (COZAAR) 50 MG tablet Take 100 mg by mouth daily.    . Probiotic  Product (PROBIOTIC DAILY PO) Take 1 capsule by mouth daily.    . rosuvastatin (CRESTOR) 5 MG tablet Take 5 mg by mouth daily.     No current facility-administered medications for this visit.    Physical Exam: Vitals:   06/16/20 1511  BP: (!) 152/70  Pulse: 64  SpO2: 93%  Weight: 182 lb (82.6 kg)  Height: 5\' 5"  (1.651 m)    GEN- The patient is well appearing, alert and oriented x 3 today.   Head- normocephalic, atraumatic Eyes-  Sclera clear, conjunctiva pink Ears- hearing intact Oropharynx- clear Lungs-  normal work of breathing Chest- pacemaker pocket is well healed Heart- Regular rate and rhythm  GI- soft  Extremities- no clubbing, cyanosis, or edema  Pacemaker interrogation- reviewed in detail today,  See PACEART report  ekg tracing ordered today is personally reviewed and shows atrial pacing 64 bpm  Assessment and Plan:  1. Symptomatic sinus bradycardia  Normal pacemaker function See Pace Art report No changes today she is not device dependant today  2. HTN Stable No change required today  3. HL Continue crestor  Risks, benefits and potential toxicities for medications prescribed and/or refilled reviewed with patient today.   Return in a year to see EP PA  [name] MD, [facility]  06/16/2020 3:26 PM

## 2020-06-24 LAB — CUP PACEART INCLINIC DEVICE CHECK
Battery Remaining Longevity: 122 mo
Battery Voltage: 3.01 V
Brady Statistic RA Percent Paced: 92 %
Brady Statistic RV Percent Paced: 0.05 %
Date Time Interrogation Session: 20210816171457
Implantable Lead Implant Date: 20210506
Implantable Lead Implant Date: 20210506
Implantable Lead Location: 753859
Implantable Lead Location: 753860
Implantable Pulse Generator Implant Date: 20210506
Lead Channel Impedance Value: 537.5 Ohm
Lead Channel Impedance Value: 625 Ohm
Lead Channel Pacing Threshold Amplitude: 0.5 V
Lead Channel Pacing Threshold Amplitude: 0.625 V
Lead Channel Pacing Threshold Pulse Width: 0.5 ms
Lead Channel Pacing Threshold Pulse Width: 0.5 ms
Lead Channel Sensing Intrinsic Amplitude: 1.7 mV
Lead Channel Sensing Intrinsic Amplitude: 12 mV
Lead Channel Setting Pacing Amplitude: 0.875
Lead Channel Setting Pacing Amplitude: 2 V
Lead Channel Setting Pacing Pulse Width: 0.5 ms
Lead Channel Setting Sensing Sensitivity: 2 mV
Pulse Gen Model: 2272
Pulse Gen Serial Number: 3825689

## 2020-06-26 DIAGNOSIS — N3020 Other chronic cystitis without hematuria: Secondary | ICD-10-CM | POA: Diagnosis not present

## 2020-07-21 ENCOUNTER — Inpatient Hospital Stay (HOSPITAL_COMMUNITY)
Admission: EM | Admit: 2020-07-21 | Discharge: 2020-07-21 | Disposition: A | Discharge status: Home / Self Care | Payer: Medicare Other | Attending: Obstetrics & Gynecology | Admitting: Obstetrics & Gynecology

## 2020-07-21 ENCOUNTER — Other Ambulatory Visit: Payer: Self-pay

## 2020-07-21 ENCOUNTER — Encounter (HOSPITAL_COMMUNITY): Payer: Self-pay | Admitting: *Deleted

## 2020-07-21 ENCOUNTER — Ambulatory Visit (HOSPITAL_COMMUNITY)
Admission: EM | Admit: 2020-07-21 | Discharge: 2020-07-21 | Disposition: A | Discharge status: Other Acute Inpatient Hospital | Payer: Medicare Other

## 2020-07-21 DIAGNOSIS — N950 Postmenopausal bleeding: Secondary | ICD-10-CM | POA: Diagnosis not present

## 2020-07-21 DIAGNOSIS — Z79899 Other long term (current) drug therapy: Secondary | ICD-10-CM | POA: Insufficient documentation

## 2020-07-21 DIAGNOSIS — Z950 Presence of cardiac pacemaker: Secondary | ICD-10-CM | POA: Diagnosis not present

## 2020-07-21 DIAGNOSIS — Z79890 Hormone replacement therapy (postmenopausal): Secondary | ICD-10-CM | POA: Insufficient documentation

## 2020-07-21 DIAGNOSIS — I10 Essential (primary) hypertension: Secondary | ICD-10-CM | POA: Diagnosis not present

## 2020-07-21 DIAGNOSIS — Z7982 Long term (current) use of aspirin: Secondary | ICD-10-CM | POA: Diagnosis not present

## 2020-07-21 DIAGNOSIS — E119 Type 2 diabetes mellitus without complications: Secondary | ICD-10-CM | POA: Diagnosis not present

## 2020-07-21 DIAGNOSIS — N898 Other specified noninflammatory disorders of vagina: Secondary | ICD-10-CM | POA: Diagnosis not present

## 2020-07-21 DIAGNOSIS — R1030 Lower abdominal pain, unspecified: Secondary | ICD-10-CM | POA: Diagnosis present

## 2020-07-21 DIAGNOSIS — K219 Gastro-esophageal reflux disease without esophagitis: Secondary | ICD-10-CM | POA: Insufficient documentation

## 2020-07-21 DIAGNOSIS — E785 Hyperlipidemia, unspecified: Secondary | ICD-10-CM | POA: Diagnosis not present

## 2020-07-21 DIAGNOSIS — Z85828 Personal history of other malignant neoplasm of skin: Secondary | ICD-10-CM | POA: Insufficient documentation

## 2020-07-21 DIAGNOSIS — N939 Abnormal uterine and vaginal bleeding, unspecified: Secondary | ICD-10-CM | POA: Diagnosis present

## 2020-07-21 DIAGNOSIS — E039 Hypothyroidism, unspecified: Secondary | ICD-10-CM | POA: Insufficient documentation

## 2020-07-21 DIAGNOSIS — Z8249 Family history of ischemic heart disease and other diseases of the circulatory system: Secondary | ICD-10-CM | POA: Insufficient documentation

## 2020-07-21 DIAGNOSIS — R102 Pelvic and perineal pain: Secondary | ICD-10-CM

## 2020-07-21 LAB — URINALYSIS, ROUTINE W REFLEX MICROSCOPIC
Bacteria, UA: NONE SEEN
Bilirubin Urine: NEGATIVE
Glucose, UA: 50 mg/dL — AB
Ketones, ur: NEGATIVE mg/dL
Leukocytes,Ua: NEGATIVE
Nitrite: NEGATIVE
Protein, ur: NEGATIVE mg/dL
Specific Gravity, Urine: 1.021 (ref 1.005–1.030)
pH: 5 (ref 5.0–8.0)

## 2020-07-21 LAB — COMPREHENSIVE METABOLIC PANEL
ALT: 17 U/L (ref 0–44)
AST: 20 U/L (ref 15–41)
Albumin: 4.2 g/dL (ref 3.5–5.0)
Alkaline Phosphatase: 66 U/L (ref 38–126)
Anion gap: 10 (ref 5–15)
BUN: 19 mg/dL (ref 8–23)
CO2: 22 mmol/L (ref 22–32)
Calcium: 9.3 mg/dL (ref 8.9–10.3)
Chloride: 103 mmol/L (ref 98–111)
Creatinine, Ser: 1.02 mg/dL — ABNORMAL HIGH (ref 0.44–1.00)
GFR calc Af Amer: >60 mL/min (ref >60)
GFR calc non Af Amer: 55 mL/min — ABNORMAL LOW (ref >60)
Glucose, Bld: 144 mg/dL — ABNORMAL HIGH (ref 70–99)
Potassium: 3.9 mmol/L (ref 3.5–5.1)
Sodium: 135 mmol/L (ref 135–145)
Total Bilirubin: 0.4 mg/dL (ref 0.3–1.2)
Total Protein: 6.5 g/dL (ref 6.5–8.1)

## 2020-07-21 LAB — CBC
HCT: 42.6 % (ref 36.0–46.0)
Hemoglobin: 13.5 g/dL (ref 12.0–15.0)
MCH: 28.1 pg (ref 26.0–34.0)
MCHC: 31.7 g/dL (ref 30.0–36.0)
MCV: 88.8 fL (ref 80.0–100.0)
Platelets: 211 10*3/uL (ref 150–400)
RBC: 4.8 MIL/uL (ref 3.87–5.11)
RDW: 13.2 % (ref 11.5–15.5)
WBC: 8.5 10*3/uL (ref 4.0–10.5)
nRBC: 0 % (ref 0.0–0.2)

## 2020-07-21 LAB — LIPASE, BLOOD: Lipase: 28 U/L (ref 11–51)

## 2020-07-21 NOTE — ED Triage Notes (Signed)
Emergency Medicine Provider OB Triage Evaluation Note  [name] is a 74 y.o. female, No obstetric history on file., at Unknown gestation who presents to the emergency department with complaints of vaginal dc and bleeding with lower abdominal pain, onset today. No prior hysterectomy.   Review of  Systems  Positive: vaginal bleeding, dc, lower abdominal pain Negative: fever, vomiting  Physical Exam  BP (!) 155/80 (BP Location: Right Arm)   Pulse 78   Temp 98.5 F (36.9 C) (Oral)   Resp 16   SpO2 98%  General: Awake, no distress  HEENT: Atraumatic  Resp: Normal effort  Cardiac: Normal rate Abd: Nondistended, nontender  MSK: Moves all extremities without difficulty Neuro: Speech clear  Medical Decision Making  Pt evaluated for pregnancy concern and is stable for transfer to MAU. Pt is in agreement with plan for transfer.  5:20 PM Discussed with MAU APP, Junie Panning, who accepts patient in transfer.  Clinical Impression   1. Vaginal discharge   2. Vaginal bleeding   3. Lower abdominal pain        Roque Lias 07/21/20 1720

## 2020-07-21 NOTE — MAU Note (Signed)
Went walking, felt something warm and wet. Went to the bathroom- noted a yellow mucous, cleaned up.  Denies any vag itching or irritation. Noted blood after she used the restroom.   Post menopause, last period 1994. 'uncomfortable' in lower abd.

## 2020-07-21 NOTE — Discharge Instructions (Signed)
Please take Tylenol 1000 mg every 6 hours as you need it for pelvic pain

## 2020-07-21 NOTE — ED Triage Notes (Signed)
Pt reports onset today of vaginal discharge that was yellow, occurred after walking at the park. Went to have bowel movement and realized she was also having vaginal bleeding. Has lower abd discomfort. No distress noted at triage.

## 2020-07-21 NOTE — MAU Note (Signed)
Pt has pacemaker ?

## 2020-07-21 NOTE — MAU Provider Note (Signed)
History     CSN: [id number]  Arrival date and time: 07/21/20 1346   First Provider Initiated Contact with Patient 07/21/20 1841      Chief Complaint  Patient presents with  . Vaginal Discharge   [name] is a 74 y.o. year old [id number] postmenopausal female who was sent to MAU from [facility] reporting she felt warm and wet in the vaginal area while out walking today. She went to the bathroom and found yellow mucous that she wiped away and cleaned herself up. She later noted VB after she went to the bathroom. She denies any vaginal itching or irritation. She also reports that she feels a little "uncomfortable" in her lower abdominal/pelvis area. She has not taken any medication for the "uncomfortable" feeling she has been having. All her symptoms started today. She is sexually active; last SI was about 2 weeks ago. Her last pap was done by her family doctor within this year. She once was a GYN patient at [facility], but was told at the age of 67 that she "would no longer need paps and did not need to come back."   OB History   No obstetric history on file.     Past Medical History:  Diagnosis Date  . Acute meniscal tear of knee   . Basal cell carcinoma   . Colon polyp   . Diabetes ([place])   . Diverticulosis   . Edema   . Esophageal reflux   . Fibrocystic breast   . Hemorrhoids   . HTN (hypertension)   . Hyperlipidemia   . Hyponatremia   . Hypothyroidism   . OA (osteoarthritis) of hip   . Seasonal allergies   . Sinus bradycardia   . Syncope    vagal,  in the setting of GI cramping    Past Surgical History:  Procedure Laterality Date  . CHOLECYSTECTOMY    . PACEMAKER IMPLANT N/A 03/06/2020   Procedure: PACEMAKER IMPLANT;  Surgeon: [name], MD;  Location: [place] [facility];  Service: Cardiovascular;  Laterality: N/A;  . TONSILLECTOMY    . tubal ligation      Family History  Problem Relation Age of Onset  . Colon cancer Mother   . Hypertension Mother   . Heart  disease Mother   . Coronary artery disease Mother   . Pancreatic cancer Father   . Other Brother        CABG    Social History   Tobacco Use  . Smoking status: Never Smoker  . Smokeless tobacco: Never Used  Vaping Use  . Vaping Use: Never used  Substance Use Topics  . Alcohol use: Not Currently  . Drug use: No    Allergies:  Allergies  Allergen Reactions  . Bactrim [Sulfamethoxazole-Trimethoprim]   . Ciprofloxacin Other (See Comments)    Muscle aches/pains  . Clarithromycin Nausea And Vomiting  . Hydrochlorothiazide Other (See Comments)    Decreased Potassium and decreased Sodium  . Lipitor [Atorvastatin] Other (See Comments)    CPK  . Niaspan [Niacin Er] Other (See Comments)    CPK  . Triglide [Fenofibrate] Other (See Comments)    CPK  . Welchol [Colesevelam Hcl] Other (See Comments)    CPK  . Zetia [Ezetimibe] Other (See Comments)    CPK   . Zostavax [Zoster Vaccine Live]     Doesn't recall the reaction  . Amoxicillin Rash  . Clindamycin/Lincomycin Rash    Medications Prior to Admission  Medication Sig Dispense Refill  Last Dose  . amLODipine (NORVASC) 5 MG tablet Take 5 mg by mouth daily.   07/21/2020 at Unknown time  . aspirin 81 MG tablet Take 81 mg by mouth daily.    07/21/2020 at Unknown time  . cholecalciferol (VITAMIN D3) 25 MCG (1000 UNIT) tablet Take 1,000 Units by mouth daily.   07/21/2020 at Unknown time  . esomeprazole (NEXIUM) 40 MG capsule Take 40 mg by mouth 2 (two) times daily before a meal.    07/21/2020 at Unknown time  . levothyroxine (SYNTHROID, LEVOTHROID) 50 MCG tablet Take 50 mcg by mouth daily before breakfast.   07/21/2020 at Unknown time  . losartan (COZAAR) 50 MG tablet Take 100 mg by mouth daily.   07/21/2020 at Unknown time  . Probiotic Product (PROBIOTIC DAILY PO) Take 1 capsule by mouth daily.   07/20/2020 at Unknown time  . rosuvastatin (CRESTOR) 5 MG tablet Take 5 mg by mouth daily.   07/20/2020 at Unknown time  . fluticasone (FLONASE)  50 MCG/ACT nasal spray Place 2 sprays into both nostrils daily as needed for allergies or rhinitis.        Review of Systems  Constitutional: Negative.   HENT: Negative.   Eyes: Negative.   Respiratory: Negative.   Cardiovascular: Negative.   Gastrointestinal: Negative.   Endocrine: Negative.   Genitourinary: Positive for pelvic pain ("feels uncomfortable") and vaginal bleeding.  Musculoskeletal: Negative.   Skin: Negative.   Allergic/Immunologic: Negative.   Neurological: Negative.   Hematological: Negative.   Psychiatric/Behavioral: Negative.    Physical Exam   Blood pressure (!) 161/70, pulse 60, temperature 98.5 F (36.9 C), temperature source Oral, resp. rate 18, SpO2 100 %.  Physical Exam Vitals and nursing note reviewed. Exam conducted with a chaperone present.  Constitutional:      Appearance: Normal appearance. She is obese.  HENT:     Head: Normocephalic and atraumatic.  Cardiovascular:     Rate and Rhythm: Normal rate.     Pulses: Normal pulses.  Pulmonary:     Effort: Pulmonary effort is normal.  Abdominal:     General: Abdomen is flat.  Genitourinary:    General: Normal vulva.     Comments: Speculum exam: small amount of dark, red vaginal bleeding noted on vulva, in vagina and coming from cervical os. 1 large cotton swab used to clean cervix, ?cervical polyp see just inside cervical os. No active VB. Bimanual unremarkable Musculoskeletal:        General: Normal range of motion.     Cervical back: Normal range of motion.  Skin:    General: Skin is warm and dry.  Neurological:     Mental Status: She is alert and oriented to person, place, and time.  Psychiatric:        Mood and Affect: Mood normal.        Behavior: Behavior normal.        Thought Content: Thought content normal.        Judgment: Judgment normal.     MAU Course  Procedures  MDM Labs drawn in ED:  Results for orders placed or performed during the hospital encounter of 07/21/20  (from the past 24 hour(s))  Lipase, blood     Status: None   Collection Time: 07/21/20  3:21 PM  Result Value Ref Range   Lipase 28 11 - 51 U/L  Comprehensive metabolic panel     Status: Abnormal   Collection Time: 07/21/20  3:21 PM  Result Value Ref Range  Sodium 135 135 - 145 mmol/L   Potassium 3.9 3.5 - 5.1 mmol/L   Chloride 103 98 - 111 mmol/L   CO2 22 22 - 32 mmol/L   Glucose, Bld 144 (H) 70 - 99 mg/dL   BUN 19 8 - 23 mg/dL   Creatinine, Ser 1.02 (H) 0.44 - 1.00 mg/dL   Calcium 9.3 8.9 - 10.3 mg/dL   Total Protein 6.5 6.5 - 8.1 g/dL   Albumin 4.2 3.5 - 5.0 g/dL   AST 20 15 - 41 U/L   ALT 17 0 - 44 U/L   Alkaline Phosphatase 66 38 - 126 U/L   Total Bilirubin 0.4 0.3 - 1.2 mg/dL   GFR calc non Af Amer 55 (L) >60 mL/min   GFR calc Af Amer >60 >60 mL/min   Anion gap 10 5 - 15  CBC     Status: None   Collection Time: 07/21/20  3:21 PM  Result Value Ref Range   WBC 8.5 4.0 - 10.5 K/uL   RBC 4.80 3.87 - 5.11 MIL/uL   Hemoglobin 13.5 12.0 - 15.0 g/dL   HCT 42.6 36 - 46 %   MCV 88.8 80.0 - 100.0 fL   MCH 28.1 26.0 - 34.0 pg   MCHC 31.7 30.0 - 36.0 g/dL   RDW 13.2 11.5 - 15.5 %   Platelets 211 150 - 400 K/uL   nRBC 0.0 0.0 - 0.2 %  Urinalysis, Routine w reflex microscopic Urine, Clean Catch     Status: Abnormal   Collection Time: 07/21/20  5:54 PM  Result Value Ref Range   Color, Urine YELLOW YELLOW   APPearance HAZY (A) CLEAR   Specific Gravity, Urine 1.021 1.005 - 1.030   pH 5.0 5.0 - 8.0   Glucose, UA 50 (A) NEGATIVE mg/dL   Hgb urine dipstick MODERATE (A) NEGATIVE   Bilirubin Urine NEGATIVE NEGATIVE   Ketones, ur NEGATIVE NEGATIVE mg/dL   Protein, ur NEGATIVE NEGATIVE mg/dL   Nitrite NEGATIVE NEGATIVE   Leukocytes,Ua NEGATIVE NEGATIVE   RBC / HPF 11-20 0 - 5 RBC/hpf   WBC, UA 0-5 0 - 5 WBC/hpf   Bacteria, UA NONE SEEN NONE SEEN   Squamous Epithelial / LPF 0-5 0 - 5   Mucus PRESENT    Ca Oxalate Crys, UA PRESENT     *Consult with Dr. Elonda Husky @ 1905 - notified of  patient's complaints, assessments, lab results, tx plan d/c home, outpt pelvic U/S and endometrial biopsy - ok to d/c home, agrees with plan   Assessment and Plan  1. PMB (postmenopausal bleeding)  - Outpatient US PELVIC COMPLETE WITH TRANSVAGINAL ordered, - F/U with [facility] for endometrial biopsy  2. Pelvic pain - Advised to take Tylenol prn pain  - Discharge home - F/U with [facility]; someone will call to schedule - Patient verbalized an understanding of the plan of care and agrees.   Laury Deep, MSN, CNM 07/21/2020, 6:41 PM

## 2020-07-24 DIAGNOSIS — K219 Gastro-esophageal reflux disease without esophagitis: Secondary | ICD-10-CM | POA: Diagnosis not present

## 2020-07-24 DIAGNOSIS — Z800 Family history of malignant neoplasm of digestive organs: Secondary | ICD-10-CM | POA: Diagnosis not present

## 2020-07-28 DIAGNOSIS — H40053 Ocular hypertension, bilateral: Secondary | ICD-10-CM | POA: Diagnosis not present

## 2020-07-28 DIAGNOSIS — E119 Type 2 diabetes mellitus without complications: Secondary | ICD-10-CM | POA: Diagnosis not present

## 2020-07-28 DIAGNOSIS — H5203 Hypermetropia, bilateral: Secondary | ICD-10-CM | POA: Diagnosis not present

## 2020-07-31 ENCOUNTER — Other Ambulatory Visit: Payer: Self-pay | Admitting: Family Medicine

## 2020-07-31 DIAGNOSIS — Z0000 Encounter for general adult medical examination without abnormal findings: Secondary | ICD-10-CM

## 2020-08-08 DIAGNOSIS — N390 Urinary tract infection, site not specified: Secondary | ICD-10-CM | POA: Diagnosis not present

## 2020-08-08 DIAGNOSIS — R300 Dysuria: Secondary | ICD-10-CM | POA: Diagnosis not present

## 2020-08-11 ENCOUNTER — Ambulatory Visit
Admission: RE | Admit: 2020-08-11 | Discharge: 2020-08-11 | Disposition: A | Discharge status: Home / Self Care | Payer: Medicare Other | Source: Ambulatory Visit | Attending: Obstetrics and Gynecology | Admitting: Obstetrics and Gynecology

## 2020-08-11 ENCOUNTER — Ambulatory Visit (INDEPENDENT_AMBULATORY_CARE_PROVIDER_SITE_OTHER): Payer: Medicare Other | Admitting: Cardiology

## 2020-08-11 ENCOUNTER — Other Ambulatory Visit: Payer: Self-pay

## 2020-08-11 ENCOUNTER — Encounter: Payer: Self-pay | Admitting: Cardiology

## 2020-08-11 VITALS — BP 140/70 | HR 68 | Ht 65.0 in | Wt 183.0 lb

## 2020-08-11 DIAGNOSIS — N950 Postmenopausal bleeding: Secondary | ICD-10-CM | POA: Insufficient documentation

## 2020-08-11 DIAGNOSIS — I10 Essential (primary) hypertension: Secondary | ICD-10-CM | POA: Diagnosis not present

## 2020-08-11 DIAGNOSIS — R001 Bradycardia, unspecified: Secondary | ICD-10-CM | POA: Diagnosis not present

## 2020-08-11 DIAGNOSIS — I200 Unstable angina: Secondary | ICD-10-CM | POA: Diagnosis not present

## 2020-08-11 DIAGNOSIS — N939 Abnormal uterine and vaginal bleeding, unspecified: Secondary | ICD-10-CM | POA: Diagnosis not present

## 2020-08-11 DIAGNOSIS — Z950 Presence of cardiac pacemaker: Secondary | ICD-10-CM

## 2020-08-11 NOTE — Progress Notes (Signed)
Cardiology Office Note:    Date:  08/11/2020   ID:  [name], DOB [birth year], MRN [id number]  PCP:  Shirline Frees, MD  [facility] Cardiologist:  Candee Furbish, MD  [facility] Electrophysiologist:  None   Referring MD: Shirline Frees, MD     History of Present Illness:    [name] is a 74 y.o. female post pacemaker, Dr. [name].  Doing well.  Prior statin intolerance.  Prolonged CK elevation.  No fevers chills nausea vomiting syncope bleeding.  Past Medical History:  Diagnosis Date  . Acute meniscal tear of knee   . Basal cell carcinoma   . Colon polyp   . Diabetes ([place])   . Diverticulosis   . Edema   . Esophageal reflux   . Fibrocystic breast   . Hemorrhoids   . HTN (hypertension)   . Hyperlipidemia   . Hyponatremia   . Hypothyroidism   . OA (osteoarthritis) of hip   . Seasonal allergies   . Sinus bradycardia   . Syncope    vagal,  in the setting of GI cramping    Past Surgical History:  Procedure Laterality Date  . CHOLECYSTECTOMY    . PACEMAKER IMPLANT N/A 03/06/2020   Procedure: PACEMAKER IMPLANT;  Surgeon: [name], MD;  Location: [place] [facility];  Service: Cardiovascular;  Laterality: N/A;  . TONSILLECTOMY    . tubal ligation      Current Medications: Current Meds  Medication Sig  . amLODipine (NORVASC) 5 MG tablet Take 5 mg by mouth daily.  Marland Kitchen aspirin 81 MG tablet Take 81 mg by mouth daily.   . cholecalciferol (VITAMIN D3) 25 MCG (1000 UNIT) tablet Take 1,000 Units by mouth daily.  Marland Kitchen esomeprazole (NEXIUM) 40 MG capsule Take 40 mg by mouth 2 (two) times daily before a meal.   . fluticasone (FLONASE) 50 MCG/ACT nasal spray Place 2 sprays into both nostrils daily as needed for allergies or rhinitis.   Marland Kitchen levothyroxine (SYNTHROID, LEVOTHROID) 50 MCG tablet Take 50 mcg by mouth daily before breakfast.  . losartan (COZAAR) 50 MG tablet Take 100 mg by mouth daily.  . Probiotic Product (PROBIOTIC DAILY PO) Take 1 capsule by mouth  daily.  . rosuvastatin (CRESTOR) 5 MG tablet Take 5 mg by mouth daily.     Allergies:   Bactrim [sulfamethoxazole-trimethoprim], Ciprofloxacin, Clarithromycin, Hydrochlorothiazide, Lipitor [atorvastatin], Niaspan [niacin er], Triglide [fenofibrate], Welchol [colesevelam hcl], Zetia [ezetimibe], Zostavax [zoster vaccine live], Amoxicillin, and Clindamycin/lincomycin   Social History   Socioeconomic History  . Marital status: Married    Spouse name: Not on file  . Number of children: Not on file  . Years of education: Not on file  . Highest education level: Not on file  Occupational History  . Not on file  Tobacco Use  . Smoking status: Never Smoker  . Smokeless tobacco: Never Used  Vaping Use  . Vaping Use: Never used  Substance and Sexual Activity  . Alcohol use: Not Currently  . Drug use: No  . Sexual activity: Yes    Birth control/protection: Post-menopausal  Other Topics Concern  . Not on file  Social History Narrative   Lives in [place] with spouse   Retired,  Previously a Materials engineer   Social Determinants of Health   Financial Resource Strain:   . Difficulty of Paying Living Expenses: Not on file  Food Insecurity:   . Worried About Charity fundraiser in the Last Year: Not on file  .  Ran Out of Food in the Last Year: Not on file  Transportation Needs:   . Lack of Transportation (Medical): Not on file  . Lack of Transportation (Non-Medical): Not on file  Physical Activity:   . Days of Exercise per Week: Not on file  . Minutes of Exercise per Session: Not on file  Stress:   . Feeling of Stress : Not on file  Social Connections:   . Frequency of Communication with Friends and Family: Not on file  . Frequency of Social Gatherings with Friends and Family: Not on file  . Attends Religious Services: Not on file  . Active Member of Clubs or Organizations: Not on file  . Attends Archivist Meetings: Not on file  . Marital Status: Not on file     Family  History: The patient's family history includes Colon cancer in her mother; Coronary artery disease in her mother; Heart disease in her mother; Hypertension in her mother; Other in her brother; Pancreatic cancer in her father.  ROS:   Please see the history of present illness.     All other systems reviewed and are negative.  EKGs/Labs/Other Studies Reviewed:      Recent Labs: 10/13/2019: TSH 1.282 10/16/2019: Magnesium 1.9 07/21/2020: ALT 17; BUN 19; Creatinine, Ser 1.02; Hemoglobin 13.5; Platelets 211; Potassium 3.9; Sodium 135  Recent Lipid Panel No results found for: CHOL, TRIG, HDL, CHOLHDL, VLDL, LDLCALC, LDLDIRECT   Risk Assessment/Calculations:       Physical Exam:    VS:  BP 140/70   Pulse 68   Ht 5\' 5"  (1.651 m)   Wt 183 lb (83 kg)   SpO2 98%   BMI 30.45 kg/m     Wt Readings from Last 3 Encounters:  08/11/20 183 lb (83 kg)  06/16/20 182 lb (82.6 kg)  03/06/20 182 lb (82.6 kg)     GEN:  Well nourished, well developed in no acute distress HEENT: Normal NECK: No JVD; No carotid bruits LYMPHATICS: No lymphadenopathy CARDIAC: RRR, no murmurs, rubs, gallops RESPIRATORY:  Clear to auscultation without rales, wheezing or rhonchi  ABDOMEN: Soft, non-tender, non-distended MUSCULOSKELETAL:  Minimal ankle edema; No deformity  SKIN: Warm and dry NEUROLOGIC:  Alert and oriented x 3 PSYCHIATRIC:  Normal affect   ASSESSMENT:    1. Bradycardia   2. Cardiac pacemaker   3. Essential hypertension    PLAN:    In order of problems listed above:  Symptomatic bradycardia -Pacemaker in place.  Doing well.  Dr. [name].  EP note reviewed from 06/16/2020 and pacemaker is functioning normally. -Prior symptomatic bradycardia with documented heart rates in the 30s and 40s.  Essential hypertension -Stable.  Doing well.  On losartan, Norvasc.  Maintaining current medication management. Prior Azor caused syncope. HCTZ caused hyponatremia.  Some of her mild ankle edema might be  related to her amlodipine.  When discussing changing the medications, we were quite hesitant because of her prior issues with medication changes.  Hyperlipidemia -Continue with Crestor 5 mg once a day.  She has had trouble with increased dosing in the past secondary to intolerance.  Has had elevated CPKs chronically in the past.  Chest discomfort -Coronary CT scan on 02/08/2020 reviewed-no CAD.  No evidence of coronary calcification.  Prior catheterization in 2012 reviewed-no CAD then either.  Preop colonoscopy -She may proceed with colonoscopy with low overall cardiac risk.  She may hold her aspirin for 7 days prior.  Dysfunctional uterine bleeding -She is currently being worked up  by OB/GYN.  Pelvic ultrasound, biopsy.  I suggested to her that we could stop her low-dose aspirin.  Reviewed ER note.  Hemoglobin stable.  No significant bleeding.  She was surprised that she was sent to the emergency room when she went to the urgent care originally for this issue.  Medication Adjustments/Labs and Tests Ordered: Current medicines are reviewed at length with the patient today.  Concerns regarding medicines are outlined above.  No orders of the defined types were placed in this encounter.  No orders of the defined types were placed in this encounter.   Patient Instructions  Medication Instructions:  No changes *If you need a refill on your cardiac medications before your next appointment, please call your pharmacy*   Lab Work: none If you have labs (blood work) drawn today and your tests are completely normal, you will receive your results only by: Marland Kitchen MyChart Message (if you have MyChart) OR . A paper copy in the mail If you have any lab test that is abnormal or we need to change your treatment, we will call you to review the results.   Testing/Procedures: none   Follow-Up: At [facility], you and your health needs are our priority.  As part of our continuing mission to provide you  with exceptional heart care, we have created designated Provider Care Teams.  These Care Teams include your primary Cardiologist (physician) and Advanced Practice Providers (APPs -  Physician Assistants and Nurse Practitioners) who all work together to provide you with the care you need, when you need it.   Your next appointment:   12 month(s)  The format for your next appointment:   In Person  Provider:   You may see Candee Furbish, MD or one of the following Advanced Practice Providers on your designated Care Team:    Truitt Merle, NP  Cecilie Kicks, NP  Kathyrn Drown, NP    Other Instructions      Signed, Candee Furbish, MD  08/11/2020 10:38 AM    [place]

## 2020-08-11 NOTE — Patient Instructions (Signed)
Medication Instructions:  °No changes °*If you need a refill on your cardiac medications before your next appointment, please call your pharmacy* ° ° °Lab Work: °none °If you have labs (blood work) drawn today and your tests are completely normal, you will receive your results only by: °• MyChart Message (if you have MyChart) OR °• A paper copy in the mail °If you have any lab test that is abnormal or we need to change your treatment, we will call you to review the results. ° ° °Testing/Procedures: °none ° ° °Follow-Up: °At [facility], you and your health needs are our priority.  As part of our continuing mission to provide you with exceptional heart care, we have created designated Provider Care Teams.  These Care Teams include your primary Cardiologist (physician) and Advanced Practice Providers (APPs -  Physician Assistants and Nurse Practitioners) who all work together to provide you with the care you need, when you need it. ° ° °Your next appointment:   °12 month(s) ° °The format for your next appointment:   °In Person ° °Provider:   °You may see Mark Skains, MD or one of the following Advanced Practice Providers on your designated Care Team:   °· Lori Gerhardt, NP °· Laura Ingold, NP °· Jill McDaniel, NP ° ° ° °Other Instructions ° ° °

## 2020-08-29 DIAGNOSIS — D229 Melanocytic nevi, unspecified: Secondary | ICD-10-CM | POA: Diagnosis not present

## 2020-08-29 DIAGNOSIS — D1801 Hemangioma of skin and subcutaneous tissue: Secondary | ICD-10-CM | POA: Diagnosis not present

## 2020-08-29 DIAGNOSIS — Z85828 Personal history of other malignant neoplasm of skin: Secondary | ICD-10-CM | POA: Diagnosis not present

## 2020-08-29 DIAGNOSIS — L84 Corns and callosities: Secondary | ICD-10-CM | POA: Diagnosis not present

## 2020-08-29 DIAGNOSIS — L814 Other melanin hyperpigmentation: Secondary | ICD-10-CM | POA: Diagnosis not present

## 2020-08-29 DIAGNOSIS — D485 Neoplasm of uncertain behavior of skin: Secondary | ICD-10-CM | POA: Diagnosis not present

## 2020-08-29 DIAGNOSIS — L820 Inflamed seborrheic keratosis: Secondary | ICD-10-CM | POA: Diagnosis not present

## 2020-08-29 DIAGNOSIS — L905 Scar conditions and fibrosis of skin: Secondary | ICD-10-CM | POA: Diagnosis not present

## 2020-08-29 DIAGNOSIS — L821 Other seborrheic keratosis: Secondary | ICD-10-CM | POA: Diagnosis not present

## 2020-09-01 ENCOUNTER — Other Ambulatory Visit (HOSPITAL_COMMUNITY)
Admission: RE | Admit: 2020-09-01 | Discharge: 2020-09-01 | Disposition: A | Discharge status: Home / Self Care | Payer: Medicare Other | Source: Ambulatory Visit | Attending: Obstetrics and Gynecology | Admitting: Obstetrics and Gynecology

## 2020-09-01 ENCOUNTER — Encounter: Payer: Self-pay | Admitting: Obstetrics and Gynecology

## 2020-09-01 ENCOUNTER — Ambulatory Visit (INDEPENDENT_AMBULATORY_CARE_PROVIDER_SITE_OTHER): Payer: Medicare Other | Admitting: Obstetrics and Gynecology

## 2020-09-01 ENCOUNTER — Other Ambulatory Visit: Payer: Self-pay

## 2020-09-01 DIAGNOSIS — I200 Unstable angina: Secondary | ICD-10-CM | POA: Diagnosis not present

## 2020-09-01 DIAGNOSIS — N950 Postmenopausal bleeding: Secondary | ICD-10-CM

## 2020-09-01 DIAGNOSIS — C541 Malignant neoplasm of endometrium: Secondary | ICD-10-CM | POA: Diagnosis not present

## 2020-09-01 NOTE — Patient Instructions (Signed)

## 2020-09-01 NOTE — Progress Notes (Signed)
  [name] presents for [facility] d/t an episode of PMB in Sept. Lasted @ 2 weeks. No further episodes of bleeding. GYN U/S normal. H/O HRT in 90's  Sexual active without problems Pap smear 2020 nl per pt Mammogram scheudled    ENDOMETRIAL BIOPSY     The indications for endometrial biopsy were reviewed.   Risks of the biopsy including cramping, bleeding, infection, uterine perforation, inadequate specimen and need for additional procedures  were discussed. The patient states she understands and agrees to undergo procedure today. Consent was signed. Time out was performed. Urine HCG was negative. During the pelvic exam, the cervix was prepped with Betadine. A single-toothed tenaculum was placed on the anterior lip of the cervix to stabilize it. The 3 mm pipelle was introduced into the endometrial cavity without difficulty to a depth of 7cm, and a moderate amount of tissue was obtained and sent to pathology. The instruments were removed from the patient's vagina. Minimal bleeding from the cervix was noted. The patient tolerated the procedure well. Routine post-procedure instructions were given to the patient.    A/P PMB EMBX completed today F/U per Bx results

## 2020-09-02 DIAGNOSIS — Z23 Encounter for immunization: Secondary | ICD-10-CM | POA: Diagnosis not present

## 2020-09-03 LAB — SURGICAL PATHOLOGY

## 2020-09-04 ENCOUNTER — Ambulatory Visit (INDEPENDENT_AMBULATORY_CARE_PROVIDER_SITE_OTHER): Payer: Medicare Other

## 2020-09-04 DIAGNOSIS — R001 Bradycardia, unspecified: Secondary | ICD-10-CM | POA: Diagnosis not present

## 2020-09-04 LAB — CUP PACEART REMOTE DEVICE CHECK
Battery Remaining Longevity: 120 mo
Battery Remaining Percentage: 95.5 %
Battery Voltage: 3.02 V
Brady Statistic AP VP Percent: 1 %
Brady Statistic AP VS Percent: 95 %
Brady Statistic AS VP Percent: 1 %
Brady Statistic AS VS Percent: 4.4 %
Brady Statistic RA Percent Paced: 94 %
Brady Statistic RV Percent Paced: 1 %
Date Time Interrogation Session: 20211104055545
Implantable Lead Implant Date: 20210506
Implantable Lead Implant Date: 20210506
Implantable Lead Location: 753859
Implantable Lead Location: 753860
Implantable Pulse Generator Implant Date: 20210506
Lead Channel Impedance Value: 530 Ohm
Lead Channel Impedance Value: 580 Ohm
Lead Channel Pacing Threshold Amplitude: 0.5 V
Lead Channel Pacing Threshold Amplitude: 0.625 V
Lead Channel Pacing Threshold Pulse Width: 0.5 ms
Lead Channel Pacing Threshold Pulse Width: 0.5 ms
Lead Channel Sensing Intrinsic Amplitude: 12 mV
Lead Channel Sensing Intrinsic Amplitude: 2.1 mV
Lead Channel Setting Pacing Amplitude: 0.875
Lead Channel Setting Pacing Amplitude: 2 V
Lead Channel Setting Pacing Pulse Width: 0.5 ms
Lead Channel Setting Sensing Sensitivity: 2 mV
Pulse Gen Model: 2272
Pulse Gen Serial Number: 3825689

## 2020-09-04 NOTE — Progress Notes (Signed)
Remote pacemaker transmission.   

## 2020-09-08 ENCOUNTER — Telehealth: Payer: Self-pay

## 2020-09-08 DIAGNOSIS — C541 Malignant neoplasm of endometrium: Secondary | ICD-10-CM

## 2020-09-08 NOTE — Telephone Encounter (Addendum)
-----   Message from Chancy Milroy, MD sent at 09/08/2020 10:43 AM EST ----- Please refer Ms Saltsman to [facility] for endometrial cancer. I have spoke with pt and she is aware of EMBX results and need for referral.  Thanks [facility] and was informed that they will call the patient with an appt and the office tomorrow.    Mel Almond, RN  09/08/20

## 2020-09-09 ENCOUNTER — Telehealth: Payer: Self-pay | Admitting: *Deleted

## 2020-09-09 ENCOUNTER — Encounter: Payer: Self-pay | Admitting: General Practice

## 2020-09-09 DIAGNOSIS — E119 Type 2 diabetes mellitus without complications: Secondary | ICD-10-CM | POA: Diagnosis not present

## 2020-09-09 NOTE — Telephone Encounter (Signed)
Called and scheduled the patient for a new appt with Dr Denman George on 11/17 at 9 am. Patient given the address and phone number for the clinic. Patient also given the policy for mask and visitors

## 2020-09-09 NOTE — Telephone Encounter (Signed)
Notified pt that someone from [place] office should be calling her today or tomorrow with an appt.  Pt verbalized understanding with no further questions.    Mel Almond, RN  09/09/20

## 2020-09-10 DIAGNOSIS — Z800 Family history of malignant neoplasm of digestive organs: Secondary | ICD-10-CM | POA: Diagnosis not present

## 2020-09-10 DIAGNOSIS — R1310 Dysphagia, unspecified: Secondary | ICD-10-CM | POA: Diagnosis not present

## 2020-09-15 ENCOUNTER — Ambulatory Visit: Payer: Medicare Other

## 2020-09-16 ENCOUNTER — Encounter: Payer: Self-pay | Admitting: Gynecologic Oncology

## 2020-09-17 ENCOUNTER — Other Ambulatory Visit: Payer: Self-pay | Admitting: Gynecologic Oncology

## 2020-09-17 ENCOUNTER — Encounter: Payer: Self-pay | Admitting: Gynecologic Oncology

## 2020-09-17 ENCOUNTER — Inpatient Hospital Stay: Payer: Medicare Other | Attending: Gynecologic Oncology | Admitting: Gynecologic Oncology

## 2020-09-17 ENCOUNTER — Other Ambulatory Visit: Payer: Self-pay

## 2020-09-17 ENCOUNTER — Telehealth: Payer: Self-pay | Admitting: *Deleted

## 2020-09-17 VITALS — BP 159/60 | HR 67 | Temp 96.4°F | Resp 18 | Ht 66.0 in | Wt 178.4 lb

## 2020-09-17 DIAGNOSIS — C541 Malignant neoplasm of endometrium: Secondary | ICD-10-CM | POA: Diagnosis not present

## 2020-09-17 DIAGNOSIS — Z950 Presence of cardiac pacemaker: Secondary | ICD-10-CM | POA: Insufficient documentation

## 2020-09-17 DIAGNOSIS — Z801 Family history of malignant neoplasm of trachea, bronchus and lung: Secondary | ICD-10-CM | POA: Insufficient documentation

## 2020-09-17 DIAGNOSIS — E119 Type 2 diabetes mellitus without complications: Secondary | ICD-10-CM | POA: Diagnosis not present

## 2020-09-17 DIAGNOSIS — Z79899 Other long term (current) drug therapy: Secondary | ICD-10-CM | POA: Insufficient documentation

## 2020-09-17 DIAGNOSIS — I10 Essential (primary) hypertension: Secondary | ICD-10-CM | POA: Insufficient documentation

## 2020-09-17 DIAGNOSIS — E785 Hyperlipidemia, unspecified: Secondary | ICD-10-CM | POA: Diagnosis not present

## 2020-09-17 DIAGNOSIS — E039 Hypothyroidism, unspecified: Secondary | ICD-10-CM | POA: Insufficient documentation

## 2020-09-17 DIAGNOSIS — Z800 Family history of malignant neoplasm of digestive organs: Secondary | ICD-10-CM | POA: Diagnosis not present

## 2020-09-17 DIAGNOSIS — Z7982 Long term (current) use of aspirin: Secondary | ICD-10-CM | POA: Diagnosis not present

## 2020-09-17 DIAGNOSIS — K219 Gastro-esophageal reflux disease without esophagitis: Secondary | ICD-10-CM | POA: Diagnosis not present

## 2020-09-17 MED ORDER — TRAMADOL HCL 50 MG PO TABS: 50.0000 mg | ORAL_TABLET | Freq: Four times a day (QID) | ORAL | 0 refills | Status: DC | PRN

## 2020-09-17 MED ORDER — SENNOSIDES-DOCUSATE SODIUM 8.6-50 MG PO TABS: 2.0000 | ORAL_TABLET | Freq: Every day | ORAL | 0 refills | Status: DC

## 2020-09-17 NOTE — Progress Notes (Signed)
Consult Note: Gyn-Onc  Consult was requested by Dr. Rip Harbour for the evaluation of [name] 74 y.o. female  CC:  Chief Complaint  Patient presents with  . Endometrial cancer, grade I (HCC)    Assessment/Plan:  [name]  is a 74 y.o.  year old P3 with grade 1 endometrial cancer.  A detailed discussion was held with the patient with regard to to her endometrial cancer diagnosis. We discussed the standard management options for uterine cancer which includes surgery followed possibly by adjuvant therapy depending on the results of surgery. The surgical management include a robotic assisted total hysterectomy and removal of the tubes and ovaries with sampling of lymph nodes. If a minimally invasive approach is not feasible, a laparotomy may be necessary (including for specimen delivery). The patient has been counseled about these surgical options and the risks of surgery in general including infection, bleeding, damage to surrounding structures (including bowel, bladder, ureters, nerves or vessels), and the postoperative risks of PE/ DVT, and lymphedema. After counseling and consideration of her options, she is in agreement to proceed with robotic assisted total hysterectomy with bilateral sapingo-oophorectomy and SLN biopsy.   She will be seen by anesthesia for preoperative clearance and discussion of postoperative pain management.  She was given the opportunity to ask questions, which were answered to her satisfaction, and she is agreement with the above mentioned plan of care.   We explained that robotic hysterectomy is typically an outpatient procedure with same day discharge provided that she is meeting appropriate discharge criteria from the PACU. We provided extensive counseling regarding post-operative expectations for recovery and restrictions/limitations. We provided information regarding multi-modal pain therapy and the importance of avoidance of opioids.   We explained that  after surgery we will review her definitive pathology and determine if adjuvant therapy is recommended.   HPI: [name] is a 74 year old P3 who was seen in consultation at the request of Dr Rip Harbour for evaluation and treatment of grade 1 endometrial cancer.   Her symptoms began 07/21/2020 with postmenopausal bleeding.  [facility] and then [facility] women's assessment unit for this symptom as she did not have a gynecologist on the same day.  A physical examination was performed but no additional testing.  Therefore she was referred for outpatient work-up with a scheduled ultrasound and appointment with a gynecologist in the following months.  Her first follow-up visit with the gynecologist was scheduled for 09/01/2020. Work-up of symptoms included a transvaginal ultrasound scan and endometrial Pipelle. Transvaginal US on 08/11/2020 showed a uterus measuring 7.4 x 2.6 x 4.4 cm with a 3 mm endometrial thickness.  The right and left ovaries were grossly normal. Endometrial sampling with endometrial Pipelle on 09/01/2020 and showed FIGO grade 1 endometrioid adenocarcinoma.   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  Dr. [name] is her interventional cardiologist and Dr. Marlou Porch is her cardiologist.  The patient has a history of a laparoscopic cholecystectomy and a tubal ligation.  Her gynecologic history is significant for abnormal Pap smears in her 8s treated with cryotherapy.  She had normal Pap smears for 30 years following that time.  Her last Pap was in 2020 and was cytologically normal.  She takes aspirin 81 mg daily.  She has had 3 prior SVD's.  Her family history is remarkable for a mother with colon cancer, father with lung  cancer who was a smoker, a brother with prostate and skin cancer, and a sister with premenopausal breast cancer.  She denies knowledge of genetic testing  for any of these family members.  Current Meds:  Outpatient Encounter Medications as of 09/17/2020  Medication Sig  . amLODipine (NORVASC) 5 MG tablet Take 5 mg by mouth daily.  Marland Kitchen aspirin 81 MG tablet Take 81 mg by mouth daily.   Marland Kitchen Bioflavonoid Products (VITAMIN C) CHEW Take  three 282 mg gummies by nouth daily  . Cranberry 500 MG CHEW Chew 1,500 mg by mouth daily.  Marland Kitchen esomeprazole (NEXIUM) 40 MG capsule Take 40 mg by mouth 2 (two) times daily before a meal.   . levothyroxine (SYNTHROID, LEVOTHROID) 50 MCG tablet Take 50 mcg by mouth daily before breakfast.  . losartan (COZAAR) 50 MG tablet Take 100 mg by mouth daily.  . Probiotic Product (PROBIOTIC DAILY PO) Take 1 capsule by mouth daily.  . rosuvastatin (CRESTOR) 5 MG tablet Take 5 mg by mouth daily.  . Cholecalciferol (VITAMIN D) 125 MCG (5000 UT) CAPS Take 5,000 Units by mouth daily.   . fluticasone (FLONASE) 50 MCG/ACT nasal spray Place 2 sprays into both nostrils daily as needed for allergies or rhinitis.  (Patient not taking: Reported on 09/16/2020)   No facility-administered encounter medications on file as of 09/17/2020.    Allergy:  Allergies  Allergen Reactions  . Amoxicillin Rash  . Bactrim [Sulfamethoxazole-Trimethoprim] Other (See Comments)    Decreased sodium significantly   . Ciprofloxacin Other (See Comments)    Muscle aches/pains  . Clarithromycin Nausea And Vomiting  . Hydrochlorothiazide Other (See Comments)    Decreased Potassium and decreased Sodium  . Lipitor [Atorvastatin] Other (See Comments)    CPK  . Niaspan [Niacin Er] Other (See Comments)    CPK  . Triglide [Fenofibrate] Other (See Comments)    CPK  . Welchol [Colesevelam Hcl] Other (See Comments)    CPK  . Zetia [Ezetimibe] Other (See Comments)    CPK   . Zostavax [Zoster Vaccine Live]     Doesn't recall the reaction  . Clindamycin/Lincomycin Rash    Social Hx:   Social History   Socioeconomic History  . Marital status: Married     Spouse name: Not on file  . Number of children: Not on file  . Years of education: Not on file  . Highest education level: Not on file  Occupational History  . Not on file  Tobacco Use  . Smoking status: Never Smoker  . Smokeless tobacco: Never Used  Vaping Use  . Vaping Use: Never used  Substance and Sexual Activity  . Alcohol use: Yes    Comment: 1-2 per year  . Drug use: No  . Sexual activity: Not Currently    Birth control/protection: Post-menopausal  Other Topics Concern  . Not on file  Social History Narrative   Lives in [place] with spouse   Retired,  Previously a Materials engineer   Social Determinants of Health   Financial Resource Strain:   . Difficulty of Paying Living Expenses: Not on file  Food Insecurity:   . Worried About Charity fundraiser in the Last Year: Not on file  . Ran Out of Food in the Last Year: Not on file  Transportation Needs:   . Lack of Transportation (Medical): Not on file  . Lack of Transportation (Non-Medical): Not on file  Physical Activity:   . Days of Exercise per Week: Not on file  .  Minutes of Exercise per Session: Not on file  Stress:   . Feeling of Stress : Not on file  Social Connections:   . Frequency of Communication with Friends and Family: Not on file  . Frequency of Social Gatherings with Friends and Family: Not on file  . Attends Religious Services: Not on file  . Active Member of Clubs or Organizations: Not on file  . Attends Archivist Meetings: Not on file  . Marital Status: Not on file  Intimate Partner Violence:   . Fear of Current or Ex-Partner: Not on file  . Emotionally Abused: Not on file  . Physically Abused: Not on file  . Sexually Abused: Not on file    Past Surgical Hx:  Past Surgical History:  Procedure Laterality Date  . CHOLECYSTECTOMY    . PACEMAKER IMPLANT N/A 03/06/2020   Procedure: PACEMAKER IMPLANT;  Surgeon: [name], MD;  Location: [facility];  Service: Cardiovascular;   Laterality: N/A;  . TONSILLECTOMY    . tubal ligation      Past Medical Hx:  Past Medical History:  Diagnosis Date  . Acute meniscal tear of knee   . Basal cell carcinoma   . Colon polyp   . Diabetes ([place])   . Diverticulosis   . Edema   . Esophageal reflux   . Fibrocystic breast   . Hemorrhoids   . HTN (hypertension)   . Hyperlipidemia   . Hyponatremia   . Hypothyroidism   . OA (osteoarthritis) of hip   . Seasonal allergies   . Sinus bradycardia   . Syncope    vagal,  in the setting of GI cramping    Past Gynecological History:  See HPI, SVD x 3 No LMP recorded. Patient is postmenopausal.  Family Hx:  Family History  Problem Relation Age of Onset  . Colon cancer Mother   . Hypertension Mother   . Heart disease Mother   . Coronary artery disease Mother   . Lung cancer Father   . Other Brother        CABG  . Breast cancer Sister   . Breast cancer Cousin   . Breast cancer Niece   . Ovarian cancer Neg Hx   . Endometrial cancer Neg Hx     Review of Systems:  Constitutional  Feels well,    ENT Normal appearing ears and nares bilaterally Skin/Breast  No rash, sores, jaundice, itching, dryness Cardiovascular  No chest pain, shortness of breath, or edema  Pulmonary  No cough or wheeze.  Gastro Intestinal  No nausea, vomitting, or diarrhoea. No bright red blood per rectum, no abdominal pain, change in bowel movement, or constipation.  Genito Urinary  No frequency, urgency, dysuria, + postmenopausal bleeding Musculo Skeletal  No myalgia, arthralgia, joint swelling or pain  Neurologic  No weakness, numbness, change in gait,  Psychology  No depression, anxiety, insomnia.   Vitals:  Blood pressure (!) 159/60, pulse 67, temperature (!) 96.4 F (35.8 C), temperature source Tympanic, resp. rate 18, height 5\' 6"  (1.676 m), weight 178 lb 6.4 oz (80.9 kg), SpO2 98 %.  Physical Exam: WD in NAD Neck  Supple NROM, without any enlargements.  Lymph Node Survey No  cervical supraclavicular or inguinal adenopathy Cardiovascular  Pulse normal rate, regularity and rhythm. S1 and S2 normal.  Lungs  Clear to auscultation bilateraly, without wheezes/crackles/rhonchi. Good air movement.  Skin  No rash/lesions/breakdown  Psychiatry  Alert and oriented to person, place, and time  Abdomen  Normoactive bowel sounds, abdomen soft, non-tender and nonobese without evidence of hernia.  Back No CVA tenderness Genito Urinary  Vulva/vagina: Normal external female genitalia.   No lesions. No discharge or bleeding.  Bladder/urethra:  No lesions or masses, well supported bladder  Vagina: slight nodularity in left fornix consistent with sebaceous glands  Cervix: Normal appearing, no lesions.  Uterus:  Small, mobile, no parametrial involvement or nodularity.  Adnexa: no palpable masses. Rectal  deferred Extremities  No bilateral cyanosis, clubbing or edema.  60 minutes of total time was spent for this patient encounter, including preparation, face-to-face counseling with the patient and coordination of care, and documentation of the encounter.   Thereasa Solo, MD  09/17/2020, 9:36 AM

## 2020-09-17 NOTE — H&P (View-Only) (Signed)
Consult Note: Gyn-Onc  Consult was requested by Dr. Rip Harbour for the evaluation of [name] 74 y.o. female  CC:  Chief Complaint  Patient presents with  . Endometrial cancer, grade I (HCC)    Assessment/Plan:  [name]  is a 73 y.o.  year old P3 with grade 1 endometrial cancer.  A detailed discussion was held with the patient with regard to to her endometrial cancer diagnosis. We discussed the standard management options for uterine cancer which includes surgery followed possibly by adjuvant therapy depending on the results of surgery. The surgical management include a robotic assisted total hysterectomy and removal of the tubes and ovaries with sampling of lymph nodes. If a minimally invasive approach is not feasible, a laparotomy may be necessary (including for specimen delivery). The patient has been counseled about these surgical options and the risks of surgery in general including infection, bleeding, damage to surrounding structures (including bowel, bladder, ureters, nerves or vessels), and the postoperative risks of PE/ DVT, and lymphedema. After counseling and consideration of her options, she is in agreement to proceed with robotic assisted total hysterectomy with bilateral sapingo-oophorectomy and SLN biopsy.   She will be seen by anesthesia for preoperative clearance and discussion of postoperative pain management.  She was given the opportunity to ask questions, which were answered to her satisfaction, and she is agreement with the above mentioned plan of care.   We explained that robotic hysterectomy is typically an outpatient procedure with same day discharge provided that she is meeting appropriate discharge criteria from the PACU. We provided extensive counseling regarding post-operative expectations for recovery and restrictions/limitations. We provided information regarding multi-modal pain therapy and the importance of avoidance of opioids.   We explained that  after surgery we will review her definitive pathology and determine if adjuvant therapy is recommended.   HPI: [name] is a 74 year old P3 who was seen in consultation at the request of Dr Rip Harbour for evaluation and treatment of grade 1 endometrial cancer.   Her symptoms began 07/21/2020 with postmenopausal bleeding.  [facility] and then the [facility] women's assessment unit for this symptom as she did not have a gynecologist on the same day.  A physical examination was performed but no additional testing.  Therefore she was referred for outpatient work-up with a scheduled ultrasound and appointment with a gynecologist in the following months.  Her first follow-up visit with the gynecologist was scheduled for 09/01/2020. Work-up of symptoms included a transvaginal ultrasound scan and endometrial Pipelle. Transvaginal US on 08/11/2020 showed a uterus measuring 7.4 x 2.6 x 4.4 cm with a 3 mm endometrial thickness.  The right and left ovaries were grossly normal. Endometrial sampling with endometrial Pipelle on 09/01/2020 and showed FIGO grade 1 endometrioid adenocarcinoma.   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  Dr. [name] is her interventional cardiologist and Dr. Marlou Porch is her cardiologist.  The patient has a history of a laparoscopic cholecystectomy and a tubal ligation.  Her gynecologic history is significant for abnormal Pap smears in her 64s treated with cryotherapy.  She had normal Pap smears for 30 years following that time.  Her last Pap was in 2020 and was cytologically normal.  She takes aspirin 81 mg daily.  She has had 3 prior SVD's.  Her family history is remarkable for a mother with colon cancer, father with lung  cancer who was a smoker, a brother with prostate and skin cancer, and a sister with premenopausal breast cancer.  She denies knowledge of genetic testing  for any of these family members.  Current Meds:  Outpatient Encounter Medications as of 09/17/2020  Medication Sig  . amLODipine (NORVASC) 5 MG tablet Take 5 mg by mouth daily.  Marland Kitchen aspirin 81 MG tablet Take 81 mg by mouth daily.   Marland Kitchen Bioflavonoid Products (VITAMIN C) CHEW Take  three 282 mg gummies by nouth daily  . Cranberry 500 MG CHEW Chew 1,500 mg by mouth daily.  Marland Kitchen esomeprazole (NEXIUM) 40 MG capsule Take 40 mg by mouth 2 (two) times daily before a meal.   . levothyroxine (SYNTHROID, LEVOTHROID) 50 MCG tablet Take 50 mcg by mouth daily before breakfast.  . losartan (COZAAR) 50 MG tablet Take 100 mg by mouth daily.  . Probiotic Product (PROBIOTIC DAILY PO) Take 1 capsule by mouth daily.  . rosuvastatin (CRESTOR) 5 MG tablet Take 5 mg by mouth daily.  . Cholecalciferol (VITAMIN D) 125 MCG (5000 UT) CAPS Take 5,000 Units by mouth daily.   . fluticasone (FLONASE) 50 MCG/ACT nasal spray Place 2 sprays into both nostrils daily as needed for allergies or rhinitis.  (Patient not taking: Reported on 09/16/2020)   No facility-administered encounter medications on file as of 09/17/2020.    Allergy:  Allergies  Allergen Reactions  . Amoxicillin Rash  . Bactrim [Sulfamethoxazole-Trimethoprim] Other (See Comments)    Decreased sodium significantly   . Ciprofloxacin Other (See Comments)    Muscle aches/pains  . Clarithromycin Nausea And Vomiting  . Hydrochlorothiazide Other (See Comments)    Decreased Potassium and decreased Sodium  . Lipitor [Atorvastatin] Other (See Comments)    CPK  . Niaspan [Niacin Er] Other (See Comments)    CPK  . Triglide [Fenofibrate] Other (See Comments)    CPK  . Welchol [Colesevelam Hcl] Other (See Comments)    CPK  . Zetia [Ezetimibe] Other (See Comments)    CPK   . Zostavax [Zoster Vaccine Live]     Doesn't recall the reaction  . Clindamycin/Lincomycin Rash    Social Hx:   Social History   Socioeconomic History  . Marital status: Married     Spouse name: Not on file  . Number of children: Not on file  . Years of education: Not on file  . Highest education level: Not on file  Occupational History  . Not on file  Tobacco Use  . Smoking status: Never Smoker  . Smokeless tobacco: Never Used  Vaping Use  . Vaping Use: Never used  Substance and Sexual Activity  . Alcohol use: Yes    Comment: 1-2 per year  . Drug use: No  . Sexual activity: Not Currently    Birth control/protection: Post-menopausal  Other Topics Concern  . Not on file  Social History Narrative   Lives in [place] with spouse   Retired,  Previously a Materials engineer   Social Determinants of Health   Financial Resource Strain:   . Difficulty of Paying Living Expenses: Not on file  Food Insecurity:   . Worried About Charity fundraiser in the Last Year: Not on file  . Ran Out of Food in the Last Year: Not on file  Transportation Needs:   . Lack of Transportation (Medical): Not on file  . Lack of Transportation (Non-Medical): Not on file  Physical Activity:   . Days of Exercise per Week: Not on file  .  Minutes of Exercise per Session: Not on file  Stress:   . Feeling of Stress : Not on file  Social Connections:   . Frequency of Communication with Friends and Family: Not on file  . Frequency of Social Gatherings with Friends and Family: Not on file  . Attends Religious Services: Not on file  . Active Member of Clubs or Organizations: Not on file  . Attends Archivist Meetings: Not on file  . Marital Status: Not on file  Intimate Partner Violence:   . Fear of Current or Ex-Partner: Not on file  . Emotionally Abused: Not on file  . Physically Abused: Not on file  . Sexually Abused: Not on file    Past Surgical Hx:  Past Surgical History:  Procedure Laterality Date  . CHOLECYSTECTOMY    . PACEMAKER IMPLANT N/A 03/06/2020   Procedure: PACEMAKER IMPLANT;  Surgeon: [name], MD;  Location: [facility];  Service: Cardiovascular;   Laterality: N/A;  . TONSILLECTOMY    . tubal ligation      Past Medical Hx:  Past Medical History:  Diagnosis Date  . Acute meniscal tear of knee   . Basal cell carcinoma   . Colon polyp   . Diabetes ([place])   . Diverticulosis   . Edema   . Esophageal reflux   . Fibrocystic breast   . Hemorrhoids   . HTN (hypertension)   . Hyperlipidemia   . Hyponatremia   . Hypothyroidism   . OA (osteoarthritis) of hip   . Seasonal allergies   . Sinus bradycardia   . Syncope    vagal,  in the setting of GI cramping    Past Gynecological History:  See HPI, SVD x 3 No LMP recorded. Patient is postmenopausal.  Family Hx:  Family History  Problem Relation Age of Onset  . Colon cancer Mother   . Hypertension Mother   . Heart disease Mother   . Coronary artery disease Mother   . Lung cancer Father   . Other Brother        CABG  . Breast cancer Sister   . Breast cancer Cousin   . Breast cancer Niece   . Ovarian cancer Neg Hx   . Endometrial cancer Neg Hx     Review of Systems:  Constitutional  Feels well,    ENT Normal appearing ears and nares bilaterally Skin/Breast  No rash, sores, jaundice, itching, dryness Cardiovascular  No chest pain, shortness of breath, or edema  Pulmonary  No cough or wheeze.  Gastro Intestinal  No nausea, vomitting, or diarrhoea. No bright red blood per rectum, no abdominal pain, change in bowel movement, or constipation.  Genito Urinary  No frequency, urgency, dysuria, + postmenopausal bleeding Musculo Skeletal  No myalgia, arthralgia, joint swelling or pain  Neurologic  No weakness, numbness, change in gait,  Psychology  No depression, anxiety, insomnia.   Vitals:  Blood pressure (!) 159/60, pulse 67, temperature (!) 96.4 F (35.8 C), temperature source Tympanic, resp. rate 18, height 5\' 6"  (1.676 m), weight 178 lb 6.4 oz (80.9 kg), SpO2 98 %.  Physical Exam: WD in NAD Neck  Supple NROM, without any enlargements.  Lymph Node Survey No  cervical supraclavicular or inguinal adenopathy Cardiovascular  Pulse normal rate, regularity and rhythm. S1 and S2 normal.  Lungs  Clear to auscultation bilateraly, without wheezes/crackles/rhonchi. Good air movement.  Skin  No rash/lesions/breakdown  Psychiatry  Alert and oriented to person, place, and time  Abdomen  Normoactive bowel sounds, abdomen soft, non-tender and nonobese without evidence of hernia.  Back No CVA tenderness Genito Urinary  Vulva/vagina: Normal external female genitalia.   No lesions. No discharge or bleeding.  Bladder/urethra:  No lesions or masses, well supported bladder  Vagina: slight nodularity in left fornix consistent with sebaceous glands  Cervix: Normal appearing, no lesions.  Uterus:  Small, mobile, no parametrial involvement or nodularity.  Adnexa: no palpable masses. Rectal  deferred Extremities  No bilateral cyanosis, clubbing or edema.  60 minutes of total time was spent for this patient encounter, including preparation, face-to-face counseling with the patient and coordination of care, and documentation of the encounter.   Thereasa Solo, MD  09/17/2020, 9:36 AM

## 2020-09-17 NOTE — Telephone Encounter (Signed)
   Primary Cardiologist: Candee Furbish, MD  Chart reviewed as part of pre-operative protocol coverage.   Seen by Dr. Marlou Porch 08/11/20. Per OV note,  "Dysfunctional uterine bleeding -She is currently being worked up by OB/GYN.  Pelvic ultrasound, biopsy.  I suggested to her that we could stop her low-dose aspirin.  Reviewed ER note.  Hemoglobin stable.  No significant bleeding.  She was surprised that she was sent to the emergency room when she went to the urgent care originally for this issue".  Given past medical history and time since last visit, based on ACC/AHA guidelines, SIDNI FUSCO would be at acceptable risk for the planned procedure without further cardiovascular testing.   I will route this recommendation to the requesting party via Epic fax function and remove from pre-op pool.  Please call with questions.  [place], [place] 09/17/2020, 2:20 PM

## 2020-09-17 NOTE — Patient Instructions (Signed)
Preparing for your Surgery  Plan for surgery on October 07, 2020 with Dr. Everitt Amber at [place] will be scheduled for a robotic assisted total laparoscopic hysterectomy (removal of the uterus and cervix), bilateral salpingo-oophorectomy (removal of both ovaries and fallopian tubes), sentinel lymph node biopsy, possible lymph node dissection.   Pre-operative Testing -You will receive a phone call from presurgical testing at [facility] to arrange for a pre-operative appointment, lab appointment, and COVID test. The COVID test normally happens 3 days prior to the surgery and they ask that you self quarantine after the test up until surgery to decrease chance of exposure.  -Bring your insurance card, copy of an advanced directive if applicable, medication list  -At that visit, you will be asked to sign a consent for a possible blood transfusion in case a transfusion becomes necessary during surgery.  The need for a blood transfusion is rare but having consent is a necessary part of your care.     -We will reach out to your cardiologists about taking aspirin before surgery. We will contact you with the results. Preferably, we would like for you to be off this for 10 days before surgery.  -Do not take supplements such as fish oil (omega 3), red yeast rice, turmeric before your surgery.   Day Before Surgery at [place] will be asked to take in a light diet the day before surgery. You will be advised you can have clear liquids up until 3 hours before your surgery.    Eat a light diet the day before surgery.  Examples including soups, broths, toast, yogurt, mashed potatoes.  AVOID GAS PRODUCING FOODS. Things to avoid include carbonated beverages (fizzy beverages), raw fruits and raw vegetables, or beans.   If your bowels are filled with gas, your surgeon will have difficulty visualizing your pelvic organs which increases your surgical risks.  Your role in recovery Your role  is to become active as soon as directed by your doctor, while still giving yourself time to heal.  Rest when you feel tired. You will be asked to do the following in order to speed your recovery:  - Cough and breathe deeply. This helps to clear and expand your lungs and can prevent pneumonia after surgery.  - [place]. Do mild physical activity. Walking or moving your legs help your circulation and body functions return to normal. Do not try to get up or walk alone the first time after surgery.   -If you develop swelling on one leg or the other, pain in the back of your leg, redness/warmth in one of your legs, please call the office or go to the Emergency Room to have a doppler to rule out a blood clot. For shortness of breath, chest pain-seek care in the Emergency Room as soon as possible. - Actively manage your pain. Managing your pain lets you move in comfort. We will ask you to rate your pain on a scale of zero to 10. It is your responsibility to tell your doctor or nurse where and how much you hurt so your pain can be treated.  Special Considerations -If you are diabetic, you may be placed on insulin after surgery to have closer control over your blood sugars to promote healing and recovery.  This does not mean that you will be discharged on insulin.  If applicable, your oral antidiabetics will be resumed when you are tolerating a solid diet.  -Your final  pathology results from surgery should be available around one week after surgery and the results will be relayed to you when available.  -FMLA forms can be faxed to [phone] and please allow 5-7 business days for completion.  Pain Management After Surgery -You have been prescribed your pain medication and bowel regimen medications before surgery so that you can have these available when you are discharged from the hospital. The pain medication is for use ONLY AFTER surgery and a new prescription will not be given.    -Make sure that you have Tylenol at home to use on a regular basis after surgery for pain control.   -Review the attached handout on narcotic use and their risks and side effects.   Bowel Regimen -You have been prescribed Sennakot-S to take nightly to prevent constipation especially if you are taking the narcotic pain medication intermittently.  It is important to prevent constipation and drink adequate amounts of liquids. You can stop taking this medication when you are not taking pain medication and you are back on your normal bowel routine.  Risks of Surgery Risks of surgery are low but include bleeding, infection, damage to surrounding structures, re-operation, blood clots, and very rarely death.   Blood Transfusion Information (For the consent to be signed before surgery)  We will be checking your blood type before surgery so in case of emergencies, we will know what type of blood you would need.                                            WHAT IS A BLOOD TRANSFUSION?  A transfusion is the replacement of blood or some of its parts. Blood is made up of multiple cells which provide different functions.  Red blood cells carry oxygen and are used for blood loss replacement.  White blood cells fight against infection.  Platelets control bleeding.  Plasma helps clot blood.  Other blood products are available for specialized needs, such as hemophilia or other clotting disorders. BEFORE THE TRANSFUSION  Who gives blood for transfusions?   You may be able to donate blood to be used at a later date on yourself (autologous donation).  Relatives can be asked to donate blood. This is generally not any safer than if you have received blood from a stranger. The same precautions are taken to ensure safety when a relative's blood is donated.  Healthy volunteers who are fully evaluated to make sure their blood is safe. This is blood bank blood. Transfusion therapy is the safest it has ever  been in the practice of medicine. Before blood is taken from a donor, a complete history is taken to make sure that person has no history of diseases nor engages in risky social behavior (examples are intravenous drug use or sexual activity with multiple partners). The donor's travel history is screened to minimize risk of transmitting infections, such as malaria. The donated blood is tested for signs of infectious diseases, such as HIV and hepatitis. The blood is then tested to be sure it is compatible with you in order to minimize the chance of a transfusion reaction. If you or a relative donates blood, this is often done in anticipation of surgery and is not appropriate for emergency situations. It takes many days to process the donated blood. RISKS AND COMPLICATIONS Although transfusion therapy is very safe and saves many lives, the  main dangers of transfusion include:   Getting an infectious disease.  Developing a transfusion reaction. This is an allergic reaction to something in the blood you were given. Every precaution is taken to prevent this. The decision to have a blood transfusion has been considered carefully by your caregiver before blood is given. Blood is not given unless the benefits outweigh the risks.  AFTER SURGERY INSTRUCTIONS  Return to work: 4 weeks if applicable  Activity: 1. Be up and out of the bed during the day.  Take a nap if needed.  You may walk up steps but be careful and use the hand rail.  Stair climbing will tire you more than you think, you may need to stop part way and rest.   2. No lifting or straining for 6 weeks over 10 pounds. No pushing, pulling, straining for 6 weeks.  3. No driving for 1 week(s).  Do not drive if you are taking narcotic pain medicine and make sure that your reaction time has returned.   4. You can shower as soon as the next day after surgery. Shower daily.  Use your regular soap and water (not directly on the incision) and pat your  incision(s) dry afterwards; don't rub.  No tub baths or submerging your body in water until cleared by your surgeon. If you have the soap that was given to you by pre-surgical testing that was used before surgery, you do not need to use it afterwards because this can irritate your incisions.   5. No sexual activity and nothing in the vagina for 8 weeks.  6. You may experience a small amount of clear drainage from your incisions, which is normal.  If the drainage persists, increases, or changes color please call the office.  7. Do not use creams, lotions, or ointments such as neosporin on your incisions after surgery until advised by your surgeon because they can cause removal of the dermabond glue on your incisions.    8. You may experience vaginal spotting after surgery or around the 6-8 week mark from surgery when the stitches at the top of the vagina begin to dissolve.  The spotting is normal but if you experience heavy bleeding, call our office.  9. Take Tylenol first for pain and only use narcotic pain medication for severe pain not relieved by the Tylenol.  Monitor your Tylenol intake to a max of 4,000 mg in a 24 hour period.   Diet: 1. Low sodium Heart Healthy Diet is recommended but you are cleared to resume your normal (before surgery) diet after your procedure.  2. It is safe to use a laxative, such as Miralax or Colace, if you have difficulty moving your bowels. You have been prescribed Sennakot at bedtime every evening to keep bowel movements regular and to prevent constipation.    Wound Care: 1. Keep clean and dry.  Shower daily.  Reasons to call the Doctor:  Fever - Oral temperature greater than 100.4 degrees Fahrenheit  Foul-smelling vaginal discharge  Difficulty urinating  Nausea and vomiting  Increased pain at the site of the incision that is unrelieved with pain medicine.  Difficulty breathing with or without chest pain  New calf pain especially if only on one  side  Sudden, continuing increased vaginal bleeding with or without clots.   Contacts: For questions or concerns you should contact:  Dr. Everitt Amber at [phone]  Joylene John, NP at [phone]  After Hours: call [phone] and have the GYN Oncologist paged/contacted (  after 5 pm or on the weekends)

## 2020-09-17 NOTE — Telephone Encounter (Signed)
   [facility] Pre-operative Risk Assessment    HEARTCARE STAFF: - Please ensure there is not already an duplicate clearance open for this procedure. - Under Visit Info/Reason for Call, type in Other and utilize the format Clearance MM/DD/YY or Clearance TBD. Do not use dashes or single digits. - If request is for dental extraction, please clarify the # of teeth to be extracted.  Request for surgical clearance:  1. What type of surgery is being performed? ROBOTIC ASST TOTAL LAP HYSTERECTOMY, B/L SALPINGO-OOPHORECTOMY, SLN Bx (Dx ENDOMETRIAL CANCER)  2. When is this surgery scheduled? 10/07/20   3. What type of clearance is required (medical clearance vs. Pharmacy clearance to hold med vs. Both)? MEDICAL  4. Are there any medications that need to be held prior to surgery and how long? ASA: REQUEST TO STOP ASA x 10 DAYS PRIOR TO SURGERY    5. Practice name and name of physician performing surgery? [facility]; DR. Terrence Dupont ROSSI   6. What is the office phone number? [phone]   7.   What is the office fax number? [phone]  8.   Anesthesia type (None, local, MAC, general) ? GENERAL   Julaine Hua 09/17/2020, 2:00 PM  _________________________________________________________________   (provider comments below)

## 2020-09-18 ENCOUNTER — Telehealth: Payer: Self-pay | Admitting: *Deleted

## 2020-09-18 NOTE — Telephone Encounter (Signed)
TC to patient regarding cardiology clearance.  Patient instructed to stop aspirin 10 days prior to surgery.  [name] verbalized understanding of information provided.

## 2020-09-18 NOTE — Telephone Encounter (Signed)
Late entry----yesterday fax request to stop ASA 10 days before surgery to her cardiology. Received form back and placed into chart. (ok to stop ASA)

## 2020-09-22 ENCOUNTER — Encounter: Payer: Self-pay | Admitting: Internal Medicine

## 2020-09-22 NOTE — Patient Instructions (Addendum)
DUE TO COVID-19 ONLY ONE VISITOR IS ALLOWED TO COME WITH YOU AND STAY IN THE WAITING ROOM ONLY DURING PRE OP AND PROCEDURE DAY OF SURGERY. THE 1 VISITOR  MAY VISIT WITH YOU AFTER SURGERY IN YOUR PRIVATE ROOM DURING VISITING HOURS ONLY!  YOU NEED TO HAVE A COVID 19 TEST ON__12-3-21_____ @_0825  am______, THIS TEST MUST BE DONE BEFORE SURGERY,  COVID TESTING SITE [address] [place] [address], IT IS ON THE RIGHT GOING OUT [address] APPROXIMATELY  2 MINUTES PAST [employer] ON THE RIGHT. ONCE YOUR COVID TEST IS COMPLETED,  PLEASE BEGIN THE QUARANTINE INSTRUCTIONS AS OUTLINED IN YOUR HANDOUT.                [name]  09/22/2020   Your procedure is scheduled on: 10-07-20   Report to [facility] Main  Entrance   Report to admitting at       1100 AM     Call this number if you have problems the morning of surgery [phone]    Remember:Eat a light diet the day before surgery.  Examples including soups, broths, toast, yogurt, mashed potatoes.  Things to avoid include carbonated beverages (fizzy beverages), raw fruits and raw vegetables, or beans. DO NOT EAT FOOD AFTER MIDNIGHT. You may have clear liquids until 1000 am then nothing by mouth     CLEAR LIQUID DIET   Foods Allowed                                                                           Foods Excluded  Black Coffee and tea, regular and decaf                             liquids that you cannot  Plain Jell-O any favor except red or purple                                           see through such as: Fruit ices (not with fruit pulp)                                                milk, soups, orange juice  Iced Popsicles                                                   All solid food                                 Cranberry, grape and apple juices Sports drinks like Gatorade Lightly seasoned clear broth or consume(fat free) Sugar, honey syrup  Sample Menu Breakfast  Lunch                                     Supper Cranberry juice                    Beef broth                            Chicken broth Jell-O                                     Grape juice                           Apple juice Coffee or tea                        Jell-O                                      Popsicle                                                Coffee or tea                        Coffee or tea  _____________________________________________________________________    If your bowels are filled with gas, your surgeon will have difficulty visualizing your pelvic organs which increases your surgical risks.    BRUSH YOUR TEETH MORNING OF SURGERY AND RINSE YOUR MOUTH OUT, NO CHEWING GUM CANDY OR MINTS.     Take these medicines the morning of surgery with A SIP OF WATER: rosuvastatin, levothyroxine, nexium, amlodipine                                 You may not have any metal on your body including hair pins and              piercings  Do not wear jewelry, make-up, lotions, powders or perfumes, deodorant             Do not wear nail polish on your fingernails.  Do not shave  48 hours prior to surgery.              Do not bring valuables to the hospital. [place].  Contacts, dentures or bridgework may not be worn into surgery.      Patients discharged the day of surgery will not be allowed to drive home. IF YOU ARE HAVING SURGERY AND GOING HOME THE SAME DAY, YOU MUST HAVE AN ADULT TO DRIVE YOU HOME AND BE WITH YOU FOR 24 HOURS. YOU MAY GO HOME BY TAXI OR UBER OR ORTHERWISE, BUT AN ADULT MUST ACCOMPANY YOU HOME AND STAY WITH YOU FOR 24 HOURS.  Name and phone number of your driver:  Special Instructions: N/A  Please read over the following fact sheets you were given: _____________________________________________________________________             [facility] - Preparing for Surgery Before  surgery, you can play an important role.  Because skin is not sterile, your skin needs to be as free of germs as possible.  You can reduce the number of germs on your skin by washing with CHG (chlorahexidine gluconate) soap before surgery.  CHG is an antiseptic cleaner which kills germs and bonds with the skin to continue killing germs even after washing. Please DO NOT use if you have an allergy to CHG or antibacterial soaps.  If your skin becomes reddened/irritated stop using the CHG and inform your nurse when you arrive at Short Stay. Do not shave (including legs and underarms) for at least 48 hours prior to the first CHG shower.  You may shave your face/neck. Please follow these instructions carefully:  1.  Shower with CHG Soap the night before surgery and the  morning of Surgery.  2.  If you choose to wash your hair, wash your hair first as usual with your  normal  shampoo.  3.  After you shampoo, rinse your hair and body thoroughly to remove the  shampoo.                           4.  Use CHG as you would any other liquid soap.  You can apply chg directly  to the skin and wash                       Gently with a scrungie or clean washcloth.  5.  Apply the CHG Soap to your body ONLY FROM THE NECK DOWN.   Do not use on face/ open                           Wound or open sores. Avoid contact with eyes, ears mouth and genitals (private parts).                       Wash face,  Genitals (private parts) with your normal soap.             6.  Wash thoroughly, paying special attention to the area where your surgery  will be performed.  7.  Thoroughly rinse your body with warm water from the neck down.  8.  DO NOT shower/wash with your normal soap after using and rinsing off  the CHG Soap.                9.  Pat yourself dry with a clean towel.            10.  Wear clean pajamas.            11.  Place clean sheets on your bed the night of your first shower and do not  sleep with pets. Day of Surgery  : Do not apply any lotions/deodorants the morning of surgery.  Please wear clean clothes to the hospital/surgery center.  FAILURE TO FOLLOW THESE INSTRUCTIONS MAY RESULT IN THE CANCELLATION OF YOUR SURGERY PATIENT SIGNATURE_________________________________  NURSE SIGNATURE__________________________________  ________________________________________________________________________

## 2020-09-22 NOTE — Progress Notes (Addendum)
PCP - Shirline Frees, MD Cardiologist - Clearance Colin Rhein  09-17-20 epic  PPM/ICD - pacemaker  St[employer] Jude Device Orders - on chart Rep Notified - Yes, Hannah on call for [facility]  informed  Chest x-ray - 03-06-20 epic EKG - 06-16-20 epic Stress Test -  ECHO - 2018 Cardiac Cath -  Last device check-- 09-04-20 epic  Sleep Study -  CPAP -   Fasting Blood Sugar - 120-140 Checks Blood Sugar __3-4 ___ times a day  Blood Thinner Instructions: Aspirin Instructions:81 mg asa stop 10 days prior  ERAS Protcol - PRE-SURGERY Ensure or G2- NO  COVID TEST- 12-3  Activity---Can walk a flight of stairs without sob and own housework Anesthesia review: Pacemaker, UA   Patient denies shortness of breath, fever, cough and chest pain at PAT appointment  none   All instructions explained to the patient, with a verbal understanding of the material. Patient agrees to go over the instructions while at home for a better understanding. Patient also instructed to self quarantine after being tested for COVID-19. The opportunity to ask questions was provided.

## 2020-09-22 NOTE — Progress Notes (Signed)
[place] DEVICE PROGRAMMING  Patient Information: Name:  TAUNIA FRASCO  DOB:  August 09, 1946  MRN:  [id number]    Planned Procedure: Robotic assisted total laparoscopic hysterectomy, bilateral salpingo-oophorectomy, sentinel lymph node biopsy  Surgeon: Dr. Everitt Amber  Date of Procedure: 10-07-20  Cautery will be used.  Position during surgery: N/A   Please send documentation back to:  [facility] (Fax # [phone])   Kristian Covey, RN  09/22/2020 10:10 AM   Device Information:  Clinic EP Physician:  [name], MD   Device Type:  Pacemaker Manufacturer and Phone #:  St. Jude/[employer]: [phone] Pacemaker Dependent?:  No. Date of Last Device Check:  09/04/20 (remote) 06/16/20 (in-clinc) Normal Device Function?:  Yes.    Electrophysiologist's Recommendations:   Have magnet available.  Provide continuous ECG monitoring when magnet is used or reprogramming is to be performed.   Procedure should not interfere with device function.  No device programming or magnet placement needed.  Per Device Clinic [address] [place] Ram, RN  11:12 AM 09/22/2020

## 2020-09-23 ENCOUNTER — Telehealth: Payer: Self-pay

## 2020-09-23 ENCOUNTER — Encounter (HOSPITAL_COMMUNITY): Payer: Self-pay

## 2020-09-23 ENCOUNTER — Other Ambulatory Visit: Payer: Self-pay

## 2020-09-23 ENCOUNTER — Encounter (HOSPITAL_COMMUNITY)
Admission: RE | Admit: 2020-09-23 | Discharge: 2020-09-23 | Disposition: A | Discharge status: Home / Self Care | Payer: Medicare Other | Source: Ambulatory Visit | Attending: Gynecologic Oncology | Admitting: Gynecologic Oncology

## 2020-09-23 DIAGNOSIS — Z79899 Other long term (current) drug therapy: Secondary | ICD-10-CM | POA: Insufficient documentation

## 2020-09-23 DIAGNOSIS — Z79890 Hormone replacement therapy (postmenopausal): Secondary | ICD-10-CM | POA: Diagnosis not present

## 2020-09-23 DIAGNOSIS — C541 Malignant neoplasm of endometrium: Secondary | ICD-10-CM | POA: Insufficient documentation

## 2020-09-23 DIAGNOSIS — Z79891 Long term (current) use of opiate analgesic: Secondary | ICD-10-CM | POA: Diagnosis not present

## 2020-09-23 DIAGNOSIS — Z01812 Encounter for preprocedural laboratory examination: Secondary | ICD-10-CM | POA: Insufficient documentation

## 2020-09-23 DIAGNOSIS — E119 Type 2 diabetes mellitus without complications: Secondary | ICD-10-CM | POA: Diagnosis not present

## 2020-09-23 DIAGNOSIS — Z7982 Long term (current) use of aspirin: Secondary | ICD-10-CM | POA: Diagnosis not present

## 2020-09-23 DIAGNOSIS — I10 Essential (primary) hypertension: Secondary | ICD-10-CM | POA: Insufficient documentation

## 2020-09-23 DIAGNOSIS — Z9049 Acquired absence of other specified parts of digestive tract: Secondary | ICD-10-CM | POA: Insufficient documentation

## 2020-09-23 DIAGNOSIS — Z950 Presence of cardiac pacemaker: Secondary | ICD-10-CM | POA: Insufficient documentation

## 2020-09-23 HISTORY — DX: Presence of cardiac pacemaker: Z95.0

## 2020-09-23 HISTORY — DX: Anxiety disorder, unspecified: F41.9

## 2020-09-23 LAB — COMPREHENSIVE METABOLIC PANEL
ALT: 15 U/L (ref 0–44)
AST: 19 U/L (ref 15–41)
Albumin: 4.4 g/dL (ref 3.5–5.0)
Alkaline Phosphatase: 70 U/L (ref 38–126)
Anion gap: 12 (ref 5–15)
BUN: 16 mg/dL (ref 8–23)
CO2: 22 mmol/L (ref 22–32)
Calcium: 9.1 mg/dL (ref 8.9–10.3)
Chloride: 100 mmol/L (ref 98–111)
Creatinine, Ser: 0.62 mg/dL (ref 0.44–1.00)
GFR, Estimated: >60 mL/min (ref >60)
Glucose, Bld: 156 mg/dL — ABNORMAL HIGH (ref 70–99)
Potassium: 4 mmol/L (ref 3.5–5.1)
Sodium: 134 mmol/L — ABNORMAL LOW (ref 135–145)
Total Bilirubin: 0.6 mg/dL (ref 0.3–1.2)
Total Protein: 7.1 g/dL (ref 6.5–8.1)

## 2020-09-23 LAB — CBC
HCT: 42.7 % (ref 36.0–46.0)
Hemoglobin: 14.2 g/dL (ref 12.0–15.0)
MCH: 29 pg (ref 26.0–34.0)
MCHC: 33.3 g/dL (ref 30.0–36.0)
MCV: 87.3 fL (ref 80.0–100.0)
Platelets: 235 10*3/uL (ref 150–400)
RBC: 4.89 MIL/uL (ref 3.87–5.11)
RDW: 13.3 % (ref 11.5–15.5)
WBC: 9.9 10*3/uL (ref 4.0–10.5)
nRBC: 0 % (ref 0.0–0.2)

## 2020-09-23 LAB — HEMOGLOBIN A1C
Hgb A1c MFr Bld: 7 % — ABNORMAL HIGH (ref 4.8–5.6)
Mean Plasma Glucose: 154.2 mg/dL

## 2020-09-23 LAB — URINALYSIS, ROUTINE W REFLEX MICROSCOPIC
Bilirubin Urine: NEGATIVE
Glucose, UA: NEGATIVE mg/dL
Hgb urine dipstick: NEGATIVE
Ketones, ur: NEGATIVE mg/dL
Nitrite: POSITIVE — AB
Protein, ur: NEGATIVE mg/dL
Specific Gravity, Urine: 1.014 (ref 1.005–1.030)
WBC, UA: >50 WBC/hpf — ABNORMAL HIGH (ref 0–5)
pH: 5 (ref 5.0–8.0)

## 2020-09-23 LAB — TYPE AND SCREEN
ABO/RH(D): O POS
Antibody Screen: NEGATIVE

## 2020-09-23 LAB — GLUCOSE, CAPILLARY: Glucose-Capillary: 149 mg/dL — ABNORMAL HIGH (ref 70–99)

## 2020-09-23 NOTE — Progress Notes (Signed)
Pt. covid test 10-03-20 pt. Having colonoscopy day before surgery and per Konrad Felix ,PA she will not need a repeat Covid test prior to surgery due to colonoscopy being a medical appointment.

## 2020-09-23 NOTE — Telephone Encounter (Signed)
Told [name] that the urinalysis shows that she may have a UTI.  She has had some burning, urgency, and frequency in the last few days.  Afebrile. She can wait for the culture to be back before an ATB in sent in .

## 2020-09-23 NOTE — Telephone Encounter (Signed)
Told [name] that she can have the colonoscopy on 10-06-20 and the surgery 10-07-20. Per Misty More with Pre Surgical testing Molly Bernard PA-C said that she can go to a medical procedure or office visit after being tested for covidand not require a repeat covid test. Pt verbalized understanding.

## 2020-09-24 NOTE — Anesthesia Preprocedure Evaluation (Addendum)
Anesthesia Evaluation  Patient identified by MRN, date of birth, ID band Patient awake    Reviewed: Allergy & Precautions, H&P , NPO status , Patient's Chart, lab work & pertinent test results, reviewed documented beta blocker date and time   Airway Mallampati: II  TM Distance: >3 FB Neck ROM: full  Mouth opening: Limited Mouth Opening Comment: ANTERIOR Dental no notable dental hx. (+) Teeth Intact, Missing, Caps, Dental Advisory Given ANTERIOR:   Pulmonary neg pulmonary ROS,    Pulmonary exam normal breath sounds clear to auscultation       Cardiovascular Exercise Tolerance: Good hypertension, + pacemaker  Rhythm:regular Rate:Normal  EKG: 06/16/2020 Rate 64 bpm Atrial paced   CV: Echo 05/30/2017 Left ventricle: The cavity size was normal. Wall thickness was  normal. Systolic function was normal. The estimated ejection  fraction was in the range of 60% to 65%. Doppler parameters are  consistent with abnormal left ventricular relaxation (grade 1  diastolic dysfunction).  - Aortic valve: There was mild regurgitation.    Neuro/Psych Anxiety negative neurological ROS  negative psych ROS   GI/Hepatic Neg liver ROS, GERD  Medicated,  Endo/Other  diabetesHypothyroidism   Renal/GU negative Renal ROS  negative genitourinary   Musculoskeletal  (+) Arthritis , Osteoarthritis,    Abdominal   Peds  Hematology negative hematology ROS (+)   Anesthesia Other Findings   Reproductive/Obstetrics negative OB ROS                           Anesthesia Physical Anesthesia Plan  ASA: III  Anesthesia Plan: General   Post-op Pain Management:    Induction: Intravenous  PONV Risk Score and Plan: 3 and Ondansetron and Treatment may vary due to age or medical condition  Airway Management Planned: Oral ETT and Video Laryngoscope Planned  Additional Equipment:   Intra-op Plan:   Post-operative  Plan: Extubation in OR  Informed Consent: I have reviewed the patients History and Physical, chart, labs and discussed the procedure including the risks, benefits and alternatives for the proposed anesthesia with the patient or authorized representative who has indicated his/her understanding and acceptance.     Dental Advisory Given  Plan Discussed with: CRNA and Anesthesiologist  Anesthesia Plan Comments: (See PAT note 09/23/2020, Konrad Felix, PA-C)      Anesthesia Quick Evaluation

## 2020-09-24 NOTE — Progress Notes (Signed)
Anesthesia Chart Review   Case: [id number] Date/Time: 10/07/20 1245   Procedures:      XI ROBOTIC ASSISTED TOTAL HYSTERECTOMY WITH BILATERAL SALPINGO OOPHORECTOMY (N/A )     SENTINEL NODE BIOPSY (N/A )   Anesthesia type: General   Pre-op diagnosis: ENDOMETRIAL CANCER   Location: [place] [facility] / [facility]   Surgeons: Everitt Amber, MD      DISCUSSION:74 y.o. never smoker with h/o HTN, DM II, pacemaker in place (last check 09/04/20, device orders in note 09/22/2020), endometrial cancer scheduled for above procedure 10/07/2020 with Dr. Everitt Amber.   Per cardiology preoperative risk assessment 09/17/2020, "Seen by Dr. Marlou Porch 08/11/20. Per OV note,  "Dysfunctional uterine bleeding -She is currently being worked up by OB/GYN. Pelvic ultrasound, biopsy. I suggested to her that we could stop her low-dose aspirin. Reviewed ER note. Hemoglobin stable. No significant bleeding. She was surprised that she was sent to the emergency room when she went to the urgent care originally for this issue". Given past medical history and time since last visit, based on ACC/AHA guidelines, [name] would be at acceptable risk for the planned procedure without further cardiovascular testing."  Anticipate pt can proceed with planned procedure barring acute status change.   VS: BP (!) 151/72   Pulse 62   Temp 36.7 C (Oral)   Resp 16   Ht 5\' 5"  (1.651 m)   Wt 79.8 kg   SpO2 98%   BMI 29.29 kg/m   PROVIDERS: Shirline Frees, MD is PCP   Candee Furbish, MD is Cardiologist  LABS: Labs reviewed: Acceptable for surgery. and UA forwarded to surgeon (all labs ordered are listed, but only abnormal results are displayed)  Labs Reviewed  HEMOGLOBIN A1C - Abnormal; Notable for the following components:      Result Value   Hgb A1c MFr Bld 7.0 (*)    All other components within normal limits  COMPREHENSIVE METABOLIC PANEL - Abnormal; Notable for the following components:   Sodium 134 (*)    Glucose, Bld 156 (*)     All other components within normal limits  URINALYSIS, ROUTINE W REFLEX MICROSCOPIC - Abnormal; Notable for the following components:   Nitrite POSITIVE (*)    Leukocytes,Ua MODERATE (*)    WBC, UA >50 (*)    Bacteria, UA MANY (*)    All other components within normal limits  GLUCOSE, CAPILLARY - Abnormal; Notable for the following components:   Glucose-Capillary 149 (*)    All other components within normal limits  CBC  TYPE AND SCREEN     IMAGES:   EKG: 06/16/2020 Rate 64 bpm Atrial paced   CV: Echo 05/30/2017 Study Conclusions   - Left ventricle: The cavity size was normal. Wall thickness was  normal. Systolic function was normal. The estimated ejection  fraction was in the range of 60% to 65%. Doppler parameters are  consistent with abnormal left ventricular relaxation (grade 1  diastolic dysfunction).  - Aortic valve: There was mild regurgitation.  Past Medical History:  Diagnosis Date  . Acute meniscal tear of knee   . Anxiety   . Basal cell carcinoma    endometrial  cancer  . Colon polyp   . Diabetes ([place])    type 2  . Diverticulosis   . Edema   . Esophageal reflux   . Fibrocystic breast   . Hemorrhoids   . HTN (hypertension)   . Hyperlipidemia   . Hyponatremia   . Hypothyroidism   .  OA (osteoarthritis) of hip   . Presence of permanent cardiac pacemaker    due to bradycardia  03-2020  . Seasonal allergies   . Sinus bradycardia   . Syncope    vagal,  in the setting of GI cramping    Past Surgical History:  Procedure Laterality Date  . CHOLECYSTECTOMY    . KNEE ARTHROSCOPY     meniscal tear   right  . PACEMAKER IMPLANT N/A 03/06/2020   Procedure: PACEMAKER IMPLANT;  Surgeon: [name], MD;  Location: [facility];  Service: Cardiovascular;  Laterality: N/A;  . TONSILLECTOMY    . TUBAL LIGATION      MEDICATIONS: . amLODipine (NORVASC) 5 MG tablet  . aspirin 81 MG tablet  . Bioflavonoid Products (VITAMIN C) CHEW  .  Cholecalciferol (VITAMIN D) 125 MCG (5000 UT) CAPS  . Cranberry 500 MG CHEW  . esomeprazole (NEXIUM) 40 MG capsule  . fluticasone (FLONASE) 50 MCG/ACT nasal spray  . levothyroxine (SYNTHROID, LEVOTHROID) 50 MCG tablet  . losartan (COZAAR) 50 MG tablet  . Polyethyl Glyc-Propyl Glyc PF (SYSTANE HYDRATION PF) 0.4-0.3 % SOLN  . Probiotic Product (PROBIOTIC DAILY PO)  . psyllium (METAMUCIL) 58.6 % packet  . rosuvastatin (CRESTOR) 5 MG tablet  . senna-docusate (SENOKOT-S) 8.6-50 MG tablet  . traMADol (ULTRAM) 50 MG tablet   No current facility-administered medications for this encounter.    Konrad Felix, PA-C [facility] [phone]

## 2020-09-25 LAB — URINE CULTURE: Culture: >=100000 — AB

## 2020-09-26 ENCOUNTER — Telehealth: Payer: Self-pay

## 2020-09-26 ENCOUNTER — Other Ambulatory Visit: Payer: Self-pay

## 2020-09-26 MED ORDER — CEFPODOXIME PROXETIL 100 MG PO TABS: 100.0000 mg | ORAL_TABLET | Freq: Two times a day (BID) | ORAL | 0 refills | Status: DC

## 2020-09-26 NOTE — Telephone Encounter (Signed)
TC to patient regarding urine culture +klebsiella.  Patient reports she was given macrobid 100mg  po bid x 7 days on 08/08/2020 for same bacteria.  Heywood Iles, NP made aware. Consulted with Florene Route, pharmacist due to patient's numerous allergies.  Received verbal order for Vantin 100mg  po bid x 7 days. Patient made aware of antibiotic and order was sent to [facility] and [facility].

## 2020-09-26 NOTE — Telephone Encounter (Signed)
error 

## 2020-09-29 ENCOUNTER — Telehealth: Payer: Self-pay

## 2020-09-29 DIAGNOSIS — N390 Urinary tract infection, site not specified: Secondary | ICD-10-CM

## 2020-09-29 NOTE — Telephone Encounter (Signed)
[name] states that the urinary frequency has decreased since beginning the new ATB Friday.  She has occasional flank pain and urgency but it can be if she has issues with bowels or drinking caffeine. Afebrile. No shaking chills. She is experiencing vaginal burning when she wears her panties.  If she does not wear them, she has no burning. She states that this has been occurring since the endometrial bx on 09-01-20 with Dr. Rip Harbour.  She will finish the cefpodoxine on Friday 10-03-20.  She wanted to know if her urine would be recheck with foley catheter insertion during surgery on 10-07-20.

## 2020-09-30 NOTE — Telephone Encounter (Signed)
Told [name] that Dr. Denman George said that she will check her urine at her post op visit since she will be receiving some powerful antibiotics during surgery. Made an appointment for lab on 10-29-20 at 2:45 pm prior to Dr. Serita Grit appointment for the urine culture. Pt to arrive at 2:30 on 10-29-20 to check in. Pt Verbalized understanding.

## 2020-10-03 ENCOUNTER — Other Ambulatory Visit (HOSPITAL_COMMUNITY)
Admission: RE | Admit: 2020-10-03 | Discharge: 2020-10-03 | Disposition: A | Discharge status: Home / Self Care | Payer: Medicare Other | Source: Ambulatory Visit | Attending: Gynecologic Oncology | Admitting: Gynecologic Oncology

## 2020-10-03 DIAGNOSIS — Z20822 Contact with and (suspected) exposure to covid-19: Secondary | ICD-10-CM | POA: Diagnosis not present

## 2020-10-03 DIAGNOSIS — Z01812 Encounter for preprocedural laboratory examination: Secondary | ICD-10-CM | POA: Insufficient documentation

## 2020-10-03 LAB — SARS CORONAVIRUS 2 (TAT 6-24 HRS): SARS Coronavirus 2: NEGATIVE

## 2020-10-06 ENCOUNTER — Telehealth: Payer: Self-pay

## 2020-10-06 DIAGNOSIS — Z86010 Personal history of colonic polyps: Secondary | ICD-10-CM | POA: Diagnosis not present

## 2020-10-06 DIAGNOSIS — Z800 Family history of malignant neoplasm of digestive organs: Secondary | ICD-10-CM | POA: Diagnosis not present

## 2020-10-06 DIAGNOSIS — K635 Polyp of colon: Secondary | ICD-10-CM | POA: Diagnosis not present

## 2020-10-06 DIAGNOSIS — D120 Benign neoplasm of cecum: Secondary | ICD-10-CM | POA: Diagnosis not present

## 2020-10-06 DIAGNOSIS — K5730 Diverticulosis of large intestine without perforation or abscess without bleeding: Secondary | ICD-10-CM | POA: Diagnosis not present

## 2020-10-06 NOTE — Progress Notes (Signed)
Pt. Aware of time change and to arrive at o800 am and clears until 0700 am then nothing by mouth.

## 2020-10-06 NOTE — Telephone Encounter (Signed)
[name] stated that she understood her written pre-op instructions. She has held her ASA since 09-24-20. She has no questions or concerns at this time.

## 2020-10-07 ENCOUNTER — Ambulatory Visit (HOSPITAL_COMMUNITY)
Admission: RE | Admit: 2020-10-07 | Discharge: 2020-10-07 | Disposition: A | Discharge status: Home / Self Care | Payer: Medicare Other | Attending: Gynecologic Oncology | Admitting: Gynecologic Oncology

## 2020-10-07 ENCOUNTER — Encounter (HOSPITAL_COMMUNITY)
Admission: RE | Disposition: A | Discharge status: Home / Self Care | Payer: Self-pay | Source: Home / Self Care | Attending: Gynecologic Oncology

## 2020-10-07 ENCOUNTER — Ambulatory Visit (HOSPITAL_COMMUNITY): Payer: Medicare Other | Admitting: Physician Assistant

## 2020-10-07 ENCOUNTER — Other Ambulatory Visit: Payer: Self-pay

## 2020-10-07 ENCOUNTER — Encounter (HOSPITAL_COMMUNITY): Payer: Self-pay | Admitting: Gynecologic Oncology

## 2020-10-07 ENCOUNTER — Ambulatory Visit (HOSPITAL_COMMUNITY): Payer: Medicare Other | Admitting: Certified Registered"

## 2020-10-07 DIAGNOSIS — E039 Hypothyroidism, unspecified: Secondary | ICD-10-CM | POA: Diagnosis not present

## 2020-10-07 DIAGNOSIS — N888 Other specified noninflammatory disorders of cervix uteri: Secondary | ICD-10-CM | POA: Diagnosis not present

## 2020-10-07 DIAGNOSIS — R001 Bradycardia, unspecified: Secondary | ICD-10-CM | POA: Insufficient documentation

## 2020-10-07 DIAGNOSIS — C541 Malignant neoplasm of endometrium: Secondary | ICD-10-CM

## 2020-10-07 DIAGNOSIS — Z7982 Long term (current) use of aspirin: Secondary | ICD-10-CM | POA: Insufficient documentation

## 2020-10-07 DIAGNOSIS — Z950 Presence of cardiac pacemaker: Secondary | ICD-10-CM | POA: Insufficient documentation

## 2020-10-07 DIAGNOSIS — Z8042 Family history of malignant neoplasm of prostate: Secondary | ICD-10-CM | POA: Insufficient documentation

## 2020-10-07 DIAGNOSIS — Z79890 Hormone replacement therapy (postmenopausal): Secondary | ICD-10-CM | POA: Diagnosis not present

## 2020-10-07 DIAGNOSIS — Z79899 Other long term (current) drug therapy: Secondary | ICD-10-CM | POA: Insufficient documentation

## 2020-10-07 DIAGNOSIS — N736 Female pelvic peritoneal adhesions (postinfective): Secondary | ICD-10-CM | POA: Diagnosis not present

## 2020-10-07 DIAGNOSIS — Z9049 Acquired absence of other specified parts of digestive tract: Secondary | ICD-10-CM | POA: Insufficient documentation

## 2020-10-07 DIAGNOSIS — I10 Essential (primary) hypertension: Secondary | ICD-10-CM | POA: Insufficient documentation

## 2020-10-07 DIAGNOSIS — E785 Hyperlipidemia, unspecified: Secondary | ICD-10-CM | POA: Diagnosis not present

## 2020-10-07 DIAGNOSIS — Z800 Family history of malignant neoplasm of digestive organs: Secondary | ICD-10-CM | POA: Diagnosis not present

## 2020-10-07 DIAGNOSIS — Z803 Family history of malignant neoplasm of breast: Secondary | ICD-10-CM | POA: Insufficient documentation

## 2020-10-07 DIAGNOSIS — Z801 Family history of malignant neoplasm of trachea, bronchus and lung: Secondary | ICD-10-CM | POA: Insufficient documentation

## 2020-10-07 DIAGNOSIS — N838 Other noninflammatory disorders of ovary, fallopian tube and broad ligament: Secondary | ICD-10-CM | POA: Insufficient documentation

## 2020-10-07 DIAGNOSIS — Z85828 Personal history of other malignant neoplasm of skin: Secondary | ICD-10-CM | POA: Insufficient documentation

## 2020-10-07 DIAGNOSIS — Z9851 Tubal ligation status: Secondary | ICD-10-CM | POA: Diagnosis not present

## 2020-10-07 HISTORY — PX: SENTINEL NODE BIOPSY: SHX6608

## 2020-10-07 HISTORY — PX: ROBOTIC ASSISTED TOTAL HYSTERECTOMY WITH BILATERAL SALPINGO OOPHERECTOMY: SHX6086

## 2020-10-07 LAB — ABO/RH: ABO/RH(D): O POS

## 2020-10-07 LAB — GLUCOSE, CAPILLARY: Glucose-Capillary: 144 mg/dL — ABNORMAL HIGH (ref 70–99)

## 2020-10-07 SURGERY — HYSTERECTOMY, TOTAL, ROBOT-ASSISTED, LAPAROSCOPIC, WITH BILATERAL SALPINGO-OOPHORECTOMY
Anesthesia: General

## 2020-10-07 MED ORDER — FENTANYL CITRATE (PF) 100 MCG/2ML IJ SOLN
INTRAMUSCULAR | Status: AC
  Filled 2020-10-07: qty 2

## 2020-10-07 MED ORDER — EPHEDRINE 5 MG/ML INJ
INTRAVENOUS | Status: AC
  Filled 2020-10-07: qty 10

## 2020-10-07 MED ORDER — STERILE WATER FOR IRRIGATION IR SOLN
Status: DC | PRN
  Administered 2020-10-07: 1000 mL

## 2020-10-07 MED ORDER — PROPOFOL 10 MG/ML IV BOLUS
INTRAVENOUS | Status: AC
  Filled 2020-10-07: qty 20

## 2020-10-07 MED ORDER — CEFAZOLIN SODIUM-DEXTROSE 2-4 GM/100ML-% IV SOLN
2.0000 g | INTRAVENOUS | Status: AC
  Administered 2020-10-07: 2 g via INTRAVENOUS
  Filled 2020-10-07: qty 100

## 2020-10-07 MED ORDER — LIDOCAINE 20MG/ML (2%) 15 ML SYRINGE OPTIME
INTRAMUSCULAR | Status: DC | PRN
  Administered 2020-10-07: 1.5 mg/kg/h via INTRAVENOUS

## 2020-10-07 MED ORDER — BUPIVACAINE HCL 0.25 % IJ SOLN
INTRAMUSCULAR | Status: AC
  Filled 2020-10-07: qty 1

## 2020-10-07 MED ORDER — ORAL CARE MOUTH RINSE: 15.0000 mL | Freq: Once | OROMUCOSAL | Status: AC

## 2020-10-07 MED ORDER — GABAPENTIN 300 MG PO CAPS
300.0000 mg | ORAL_CAPSULE | ORAL | Status: AC
  Administered 2020-10-07: 300 mg via ORAL
  Filled 2020-10-07: qty 1

## 2020-10-07 MED ORDER — SUGAMMADEX SODIUM 200 MG/2ML IV SOLN
INTRAVENOUS | Status: DC | PRN
  Administered 2020-10-07: 200 mg via INTRAVENOUS

## 2020-10-07 MED ORDER — FENTANYL CITRATE (PF) 250 MCG/5ML IJ SOLN
INTRAMUSCULAR | Status: DC | PRN
  Administered 2020-10-07: 100 ug via INTRAVENOUS

## 2020-10-07 MED ORDER — ACETAMINOPHEN 325 MG PO TABS: 325.0000 mg | ORAL_TABLET | ORAL | Status: DC | PRN

## 2020-10-07 MED ORDER — ACETAMINOPHEN 160 MG/5ML PO SOLN: 325.0000 mg | ORAL | Status: DC | PRN

## 2020-10-07 MED ORDER — SODIUM CHLORIDE 0.9% FLUSH: 3.0000 mL | Freq: Two times a day (BID) | INTRAVENOUS | Status: DC

## 2020-10-07 MED ORDER — MIDAZOLAM HCL 2 MG/2ML IJ SOLN: INTRAMUSCULAR | Status: DC | PRN

## 2020-10-07 MED ORDER — ONDANSETRON HCL 4 MG/2ML IJ SOLN
INTRAMUSCULAR | Status: AC
  Filled 2020-10-07: qty 2

## 2020-10-07 MED ORDER — STERILE WATER FOR INJECTION IJ SOLN
INTRAMUSCULAR | Status: AC
  Filled 2020-10-07: qty 10

## 2020-10-07 MED ORDER — ROCURONIUM BROMIDE 10 MG/ML (PF) SYRINGE
PREFILLED_SYRINGE | INTRAVENOUS | Status: DC | PRN
  Administered 2020-10-07: 70 mg via INTRAVENOUS

## 2020-10-07 MED ORDER — LACTATED RINGERS IR SOLN
Status: DC | PRN
  Administered 2020-10-07: 1000 mL

## 2020-10-07 MED ORDER — PHENYLEPHRINE 40 MCG/ML (10ML) SYRINGE FOR IV PUSH (FOR BLOOD PRESSURE SUPPORT)
PREFILLED_SYRINGE | INTRAVENOUS | Status: AC
  Filled 2020-10-07: qty 10

## 2020-10-07 MED ORDER — ACETAMINOPHEN 500 MG PO TABS
1000.0000 mg | ORAL_TABLET | ORAL | Status: AC
  Administered 2020-10-07: 1000 mg via ORAL
  Filled 2020-10-07: qty 2

## 2020-10-07 MED ORDER — LACTATED RINGERS IV SOLN: INTRAVENOUS | Status: DC

## 2020-10-07 MED ORDER — DEXAMETHASONE SODIUM PHOSPHATE 10 MG/ML IJ SOLN
INTRAMUSCULAR | Status: DC | PRN
  Administered 2020-10-07: 4 mg via INTRAVENOUS

## 2020-10-07 MED ORDER — OXYCODONE HCL 5 MG PO TABS: 5.0000 mg | ORAL_TABLET | Freq: Once | ORAL | Status: DC | PRN

## 2020-10-07 MED ORDER — PROPOFOL 10 MG/ML IV BOLUS
INTRAVENOUS | Status: DC | PRN
  Administered 2020-10-07: 110 mg via INTRAVENOUS

## 2020-10-07 MED ORDER — ONDANSETRON HCL 4 MG/2ML IJ SOLN: 4.0000 mg | Freq: Once | INTRAMUSCULAR | Status: DC | PRN

## 2020-10-07 MED ORDER — FENTANYL CITRATE (PF) 100 MCG/2ML IJ SOLN
25.0000 ug | INTRAMUSCULAR | Status: DC | PRN
  Administered 2020-10-07: 25 ug via INTRAVENOUS

## 2020-10-07 MED ORDER — DEXAMETHASONE SODIUM PHOSPHATE 10 MG/ML IJ SOLN
INTRAMUSCULAR | Status: AC
  Filled 2020-10-07: qty 1

## 2020-10-07 MED ORDER — ENOXAPARIN SODIUM 40 MG/0.4ML ~~LOC~~ SOLN
40.0000 mg | SUBCUTANEOUS | Status: AC
  Administered 2020-10-07: 40 mg via SUBCUTANEOUS
  Filled 2020-10-07: qty 0.4

## 2020-10-07 MED ORDER — CHLORHEXIDINE GLUCONATE 0.12 % MT SOLN
15.0000 mL | Freq: Once | OROMUCOSAL | Status: AC
  Administered 2020-10-07: 15 mL via OROMUCOSAL

## 2020-10-07 MED ORDER — ROCURONIUM BROMIDE 10 MG/ML (PF) SYRINGE
PREFILLED_SYRINGE | INTRAVENOUS | Status: AC
  Filled 2020-10-07: qty 10

## 2020-10-07 MED ORDER — EPHEDRINE SULFATE-NACL 50-0.9 MG/10ML-% IV SOSY
PREFILLED_SYRINGE | INTRAVENOUS | Status: DC | PRN
  Administered 2020-10-07: 5 mg via INTRAVENOUS
  Administered 2020-10-07 (×2): 10 mg via INTRAVENOUS

## 2020-10-07 MED ORDER — PHENYLEPHRINE 40 MCG/ML (10ML) SYRINGE FOR IV PUSH (FOR BLOOD PRESSURE SUPPORT)
PREFILLED_SYRINGE | INTRAVENOUS | Status: DC | PRN
  Administered 2020-10-07: 40 ug via INTRAVENOUS

## 2020-10-07 MED ORDER — LIDOCAINE 2% (20 MG/ML) 5 ML SYRINGE
INTRAMUSCULAR | Status: DC | PRN
  Administered 2020-10-07: 40 mg via INTRAVENOUS

## 2020-10-07 MED ORDER — MEPERIDINE HCL 50 MG/ML IJ SOLN: 6.2500 mg | INTRAMUSCULAR | Status: DC | PRN

## 2020-10-07 MED ORDER — OXYCODONE HCL 5 MG/5ML PO SOLN: 5.0000 mg | Freq: Once | ORAL | Status: DC | PRN

## 2020-10-07 MED ORDER — ONDANSETRON HCL 4 MG/2ML IJ SOLN
INTRAMUSCULAR | Status: DC | PRN
  Administered 2020-10-07: 4 mg via INTRAVENOUS

## 2020-10-07 SURGICAL SUPPLY — 70 items
ADH SKN CLS APL DERMABOND .7 (GAUZE/BANDAGES/DRESSINGS) ×1
AGENT HMST KT MTR STRL THRMB (HEMOSTASIS)
APL ESCP 34 STRL LF DISP (HEMOSTASIS)
APPLICATOR SURGIFLO ENDO (HEMOSTASIS) IMPLANT
BACTOSHIELD CHG 4% 4OZ (MISCELLANEOUS) ×1
BAG LAPAROSCOPIC 12 15 PORT 16 (BASKET) IMPLANT
BAG RETRIEVAL 12/15 (BASKET)
BAG SPEC RTRVL LRG 6X4 10 (ENDOMECHANICALS)
BLADE SURG SZ10 CARB STEEL (BLADE) IMPLANT
COVER BACK TABLE 60X90IN (DRAPES) ×2 IMPLANT
COVER TIP SHEARS 8 DVNC (MISCELLANEOUS) ×1 IMPLANT
COVER TIP SHEARS 8MM DA VINCI (MISCELLANEOUS) ×2
COVER WAND RF STERILE (DRAPES) IMPLANT
DECANTER SPIKE VIAL GLASS SM (MISCELLANEOUS) ×1 IMPLANT
DERMABOND ADVANCED (GAUZE/BANDAGES/DRESSINGS) ×1
DERMABOND ADVANCED .7 DNX12 (GAUZE/BANDAGES/DRESSINGS) ×1 IMPLANT
DRAPE ARM DVNC X/XI (DISPOSABLE) ×4 IMPLANT
DRAPE COLUMN DVNC XI (DISPOSABLE) ×1 IMPLANT
DRAPE DA VINCI XI ARM (DISPOSABLE) ×8
DRAPE DA VINCI XI COLUMN (DISPOSABLE) ×2
DRAPE SHEET LG 3/4 BI-LAMINATE (DRAPES) ×2 IMPLANT
DRAPE SURG IRRIG POUCH 19X23 (DRAPES) ×2 IMPLANT
DRSG OPSITE POSTOP 4X6 (GAUZE/BANDAGES/DRESSINGS) IMPLANT
DRSG OPSITE POSTOP 4X8 (GAUZE/BANDAGES/DRESSINGS) IMPLANT
ELECT PENCIL ROCKER SW 15FT (MISCELLANEOUS) IMPLANT
ELECT REM PT RETURN 15FT ADLT (MISCELLANEOUS) ×2 IMPLANT
GLOVE BIO SURGEON STRL SZ 6 (GLOVE) ×8 IMPLANT
GLOVE BIO SURGEON STRL SZ 6.5 (GLOVE) ×4 IMPLANT
GOWN STRL REUS W/ TWL LRG LVL3 (GOWN DISPOSABLE) ×4 IMPLANT
GOWN STRL REUS W/TWL LRG LVL3 (GOWN DISPOSABLE) ×8
HOLDER FOLEY CATH W/STRAP (MISCELLANEOUS) IMPLANT
IRRIG SUCT STRYKERFLOW 2 WTIP (MISCELLANEOUS) ×2
IRRIGATION SUCT STRKRFLW 2 WTP (MISCELLANEOUS) ×1 IMPLANT
KIT PROCEDURE DA VINCI SI (MISCELLANEOUS) ×2
KIT PROCEDURE DVNC SI (MISCELLANEOUS) IMPLANT
KIT TURNOVER KIT A (KITS) IMPLANT
MANIPULATOR UTERINE 4.5 ZUMI (MISCELLANEOUS) ×2 IMPLANT
NDL SPNL 18GX3.5 QUINCKE PK (NEEDLE) IMPLANT
NEEDLE HYPO 22GX1.5 SAFETY (NEEDLE) ×2 IMPLANT
NEEDLE SPNL 18GX3.5 QUINCKE PK (NEEDLE) ×2 IMPLANT
OBTURATOR OPTICAL STANDARD 8MM (TROCAR) ×2
OBTURATOR OPTICAL STND 8 DVNC (TROCAR) ×1
OBTURATOR OPTICALSTD 8 DVNC (TROCAR) ×1 IMPLANT
PACK ROBOT GYN CUSTOM WL (TRAY / TRAY PROCEDURE) ×2 IMPLANT
PAD POSITIONING PINK XL (MISCELLANEOUS) ×2 IMPLANT
PORT ACCESS TROCAR AIRSEAL 12 (TROCAR) ×1 IMPLANT
PORT ACCESS TROCAR AIRSEAL 5M (TROCAR) ×1
POUCH SPECIMEN RETRIEVAL 10MM (ENDOMECHANICALS) IMPLANT
SCRUB CHG 4% DYNA-HEX 4OZ (MISCELLANEOUS) ×1 IMPLANT
SEAL CANN UNIV 5-8 DVNC XI (MISCELLANEOUS) ×3 IMPLANT
SEAL XI 5MM-8MM UNIVERSAL (MISCELLANEOUS) ×6
SET TRI-LUMEN FLTR TB AIRSEAL (TUBING) ×2 IMPLANT
SPONGE LAP 18X18 RF (DISPOSABLE) IMPLANT
SURGIFLO W/THROMBIN 8M KIT (HEMOSTASIS) IMPLANT
SUT MNCRL AB 4-0 PS2 18 (SUTURE) IMPLANT
SUT PDS AB 1 TP1 96 (SUTURE) IMPLANT
SUT VIC AB 0 CT1 27 (SUTURE)
SUT VIC AB 0 CT1 27XBRD ANTBC (SUTURE) IMPLANT
SUT VIC AB 2-0 CT1 27 (SUTURE)
SUT VIC AB 2-0 CT1 TAPERPNT 27 (SUTURE) IMPLANT
SUT VIC AB 4-0 PS2 18 (SUTURE) ×4 IMPLANT
SYR 10ML LL (SYRINGE) ×1 IMPLANT
SYR 20ML LL LF (SYRINGE) IMPLANT
SYR 50ML LL SCALE MARK (SYRINGE) IMPLANT
TOWEL OR NON WOVEN STRL DISP B (DISPOSABLE) ×2 IMPLANT
TRAP SPECIMEN MUCUS 40CC (MISCELLANEOUS) IMPLANT
TRAY FOLEY MTR SLVR 16FR STAT (SET/KITS/TRAYS/PACK) ×2 IMPLANT
TROCAR XCEL NON-BLD 5MMX100MML (ENDOMECHANICALS) IMPLANT
UNDERPAD 30X36 HEAVY ABSORB (UNDERPADS AND DIAPERS) ×2 IMPLANT
WATER STERILE IRR 1000ML POUR (IV SOLUTION) ×2 IMPLANT

## 2020-10-07 NOTE — Anesthesia Procedure Notes (Signed)
Procedure Name: Intubation Date/Time: 10/07/2020 11:48 AM Performed by: Eben Burow, CRNA Pre-anesthesia Checklist: Patient identified, Emergency Drugs available, Suction available, Patient being monitored and Timeout performed Patient Re-evaluated:Patient Re-evaluated prior to induction Oxygen Delivery Method: Circle system utilized Preoxygenation: Pre-oxygenation with 100% oxygen Induction Type: IV induction Ventilation: Mask ventilation without difficulty Laryngoscope Size: Mac and 4 Grade View: Grade I Tube type: Oral Tube size: 7.0 mm Number of attempts: 1 Airway Equipment and Method: Stylet Placement Confirmation: ETT inserted through vocal cords under direct vision,  positive ETCO2 and breath sounds checked- equal and bilateral Secured at: 22 cm Tube secured with: Tape Dental Injury: Teeth and Oropharynx as per pre-operative assessment

## 2020-10-07 NOTE — Transfer of Care (Signed)
Immediate Anesthesia Transfer of Care Note  Patient: [name]  Procedure(s) Performed: XI ROBOTIC ASSISTED TOTAL HYSTERECTOMY WITH BILATERAL SALPINGO OOPHORECTOMY (N/A ) SENTINEL NODE BIOPSY (N/A )  Patient Location: PACU  Anesthesia Type:General  Level of Consciousness: awake, alert  and patient cooperative  Airway & Oxygen Therapy: Patient Spontanous Breathing and Patient connected to face mask oxygen  Post-op Assessment: Report given to RN and Post -op Vital signs reviewed and stable  Post vital signs: Reviewed and stable  Last Vitals:  Vitals Value Taken Time  BP    Temp    Pulse    Resp    SpO2      Last Pain:  Vitals:   10/07/20 0832  TempSrc:   PainSc: 0-No pain         Complications: No complications documented.

## 2020-10-07 NOTE — Interval H&P Note (Signed)
History and Physical Interval Note:  10/07/2020 10:47 AM  [name]  has presented today for surgery, with the diagnosis of ENDOMETRIAL CANCER.  The various methods of treatment have been discussed with the patient and family. After consideration of risks, benefits and other options for treatment, the patient has consented to  Procedure(s): XI ROBOTIC ASSISTED TOTAL HYSTERECTOMY WITH BILATERAL SALPINGO OOPHORECTOMY (N/A) SENTINEL NODE BIOPSY (N/A) as a surgical intervention.  The patient's history has been reviewed, patient examined, no change in status, stable for surgery.  I have reviewed the patient's chart and labs.  Questions were answered to the patient's satisfaction.     Thereasa Solo

## 2020-10-07 NOTE — Anesthesia Postprocedure Evaluation (Signed)
Anesthesia Post Note  Patient: [name]  Procedure(s) Performed: XI ROBOTIC ASSISTED TOTAL HYSTERECTOMY WITH BILATERAL SALPINGO OOPHORECTOMY (N/A ) SENTINEL NODE BIOPSY (N/A )     Patient location during evaluation: PACU Anesthesia Type: General Level of consciousness: awake and alert Pain management: pain level controlled Vital Signs Assessment: post-procedure vital signs reviewed and stable Respiratory status: spontaneous breathing, nonlabored ventilation, respiratory function stable and patient connected to nasal cannula oxygen Cardiovascular status: blood pressure returned to baseline and stable Postop Assessment: no apparent nausea or vomiting Anesthetic complications: no   No complications documented.  Last Vitals:  Vitals:   10/07/20 1400 10/07/20 1415  BP: 137/72 134/70  Pulse: (!) 58 60  Resp: 17 14  Temp:  (!) (P) 35.7 C  SpO2: 100% 97%    Last Pain:  Vitals:   10/07/20 1415  TempSrc:   PainSc: 3                  Carder Yin

## 2020-10-07 NOTE — Op Note (Signed)
OPERATIVE NOTE 12//7/21  Surgeon: Donaciano Eva   Assistants: Dr Lahoma Crocker (an MD assistant was necessary for tissue manipulation, management of robotic instrumentation, retraction and positioning due to the complexity of the case and hospital policies).   Anesthesia: General endotracheal anesthesia  ASA Class: 3   Pre-operative Diagnosis: endometrial cancer grade 1  Post-operative Diagnosis: same,   Operation: Robotic-assisted laparoscopic total hysterectomy with bilateral salpingoophorectomy, SLN biopsy   Surgeon: Donaciano Eva  Assistant Surgeon: Lahoma Crocker MD  Anesthesia: GET  Urine Output: 200cc  Operative Findings:  : 6cm normal appearing uterus, normal tubes and ovaries bilaterally, normal appendix, normal peritoneal cavity, no gross extrauterine disease.  Estimated Blood Loss:  20cc      Total IV Fluids: 800 ml         Specimens: uterus, cervix, bilateral tubes and ovaries, right and left external iliac SLN biopsy         Complications:  None; patient tolerated the procedure well.         Disposition: PACU - hemodynamically stable.  Procedure Details  The patient was seen in the Holding Room. The risks, benefits, complications, treatment options, and expected outcomes were discussed with the patient.  The patient concurred with the proposed plan, giving informed consent.  The site of surgery properly noted/marked. The patient was identified as Presenter, broadcasting and the procedure verified as a Robotic-assisted hysterectomy with bilateral salpingo oophorectomy with SLN biopsy. A Time Out was held and the above information confirmed.  After induction of anesthesia, the patient was draped and prepped in the usual sterile manner. Pt was placed in supine position after anesthesia and draped and prepped in the usual sterile manner. The abdominal drape was placed after the CholoraPrep had been allowed to dry for 3 minutes.  Her arms were tucked to  her side with all appropriate precautions.  The shoulders were stabilized with padded shoulder blocks applied to the acromium processes.  The patient was placed in the semi-lithotomy position in [place].  The perineum was prepped with Betadine. The patient was then prepped. Foley catheter was placed.  A sterile speculum was placed in the vagina.  The cervix was grasped with a single-tooth tenaculum. 2mg  total of ICG was injected into the cervical stroma at 2 and 9 o'clock with 1cc injected at a 1cm and 80mm depth (concentration 0.5mg /ml) in all locations. The cervix was dilated with Kennon Rounds dilators.  The ZUMI uterine manipulator with a medium colpotomizer ring was placed without difficulty.  A pneum occluder balloon was placed over the manipulator.  OG tube placement was confirmed and to suction.   Next, a 5 mm skin incision was made 1 cm below the subcostal margin in the midclavicular line.  The 5 mm Optiview port and scope was used for direct entry.  Opening pressure was under 10 mm CO2.  The abdomen was insufflated and the findings were noted as above.   At this point and all points during the procedure, the patient's intra-abdominal pressure did not exceed 15 mmHg. Next, a 10 mm skin incision was made in the umbilicus and a right and left port was placed about 10 cm lateral to the robot port on the right and left side.  All ports were placed under direct visualization.  The patient was placed in steep Trendelenburg.  Bowel was folded away into the upper abdomen.  The robot was docked in the normal manner.  The right and left peritoneum were opened parallel to  the IP ligament to open the retroperitoneal spaces bilaterally. The SLN mapping was performed in bilateral pelvic basins. The para rectal and paravesical spaces were opened up entirely with careful dissection below the level of the ureters bilaterally and to the depth of the uterine artery origin in order to skeletonize the uterine "web" and ensure  visualization of all parametrial channels. The para-aortic basins were carefully exposed and evaluated for isolated para-aortic SLN's. Lymphatic channels were identified travelling to the following visualized sentinel lymph node's: right and left external iliac SLN. These SLN's were separated from their surrounding lymphatic tissue, removed and sent for permanent pathology.  The hysterectomy was started after the round ligament on the right side was incised and the retroperitoneum was entered and the pararectal space was developed.  The ureter was noted to be on the medial leaf of the broad ligament.  The peritoneum above the ureter was incised and stretched and the infundibulopelvic ligament was skeletonized, cauterized and cut.  The posterior peritoneum was taken down to the level of the KOH ring.  The anterior peritoneum was also taken down.  The bladder flap was created to the level of the KOH ring.  The uterine artery on the right side was skeletonized, cauterized and cut in the normal manner.  A similar procedure was performed on the left.  The colpotomy was made and the uterus, cervix, bilateral ovaries and tubes were amputated and delivered through the vagina.  Pedicles were inspected and excellent hemostasis was achieved.    The colpotomy at the vaginal cuff was closed with Vicryl on a CT1 needle in a running manner.  Irrigation was used and excellent hemostasis was achieved.  At this point in the procedure was completed.  Robotic instruments were removed under direct visulaization.  The robot was undocked. The 10 mm ports were closed with Vicryl on a UR-5 needle and the fascia was closed with 0 Vicryl on a UR-5 needle.  The skin was closed with 4-0 Vicryl in a subcuticular manner.  Dermabond was applied.  Sponge, lap and needle counts correct x 2.  The patient was taken to the recovery room in stable condition.  The vagina was swabbed with  minimal bleeding noted.   All instrument and needle counts  were correct x  3.   The patient was transferred to the recovery room in a stable condition.  Donaciano Eva, MD

## 2020-10-07 NOTE — Discharge Instructions (Signed)
Return to work: 4 weeks (2 weeks with physical restrictions).  Activity: 1. Be up and out of the bed during the day.  Take a nap if needed.  You may walk up steps but be careful and use the hand rail.  Stair climbing will tire you more than you think, you may need to stop part way and rest.   2. No lifting or straining for 4 weeks.  3. No driving for 1 weeks.  Do Not drive if you are taking narcotic pain medicine.  4. Shower daily.  Use soap and water on your incision and pat dry; don't rub.   5. No sexual activity and nothing in the vagina for 8 weeks.  Medications:  - Take ibuprofen and tylenol first line for pain control. Take these regularly (every 6 hours) to decrease the build up of pain.  - If necessary, for severe pain not relieved by ibuprofen, contact Dr Serita Grit office and you will be prescribed percocet.  - While taking percocet you should take sennakot every night to reduce the likelihood of constipation. If this causes diarrhea, stop its use.  Diet: 1. Low sodium Heart Healthy Diet is recommended.  2. It is safe to use a laxative if you have difficulty moving your bowels.   Wound Care: 1. Keep clean and dry.  Shower daily.  Reasons to call the Doctor:   Fever - Oral temperature greater than 100.4 degrees Fahrenheit  Foul-smelling vaginal discharge  Difficulty urinating  Nausea and vomiting  Increased pain at the site of the incision that is unrelieved with pain medicine.  Difficulty breathing with or without chest pain  New calf pain especially if only on one side  Sudden, continuing increased vaginal bleeding with or without clots.   Follow-up: 1. See Everitt Amber in 4 weeks.  Contacts: For questions or concerns you should contact:  Dr. Everitt Amber at [phone] After hours and on week-ends call [phone] and ask to speak to the physician on call for Gynecologic Oncology  After Your Surgery  The information in this section will tell you what  to expect after your surgery, both during your stay and after you leave. You will learn how to safely recover from your surgery. Write down any questions you have and be sure to ask your doctor or nurse.  What to Expect When you wake up after your surgery, you will be in the [facility] ([facility]) or your recovery room. A nurse will be monitoring your body temperature, blood pressure, pulse, and oxygen levels. You may have a urinary catheter in your bladder to help monitor the amount of urine you are making. It should come out before you go home. You will also have compression boots on your lower legs to help your circulation. Your pain medication will be given through an IV line or in tablet form. If you are having pain, tell your nurse. Your nurse will tell you how to recover from your surgery. Below are examples of ways you can help yourself recover safely. . You will be encouraged to walk with the help of your nurse or physical therapist. We will give you medication to relieve pain. Walking helps reduce the risk for blood clots and pneumonia. It also helps to stimulate your bowels so they begin working again. . Use your incentive spirometer. This will help your lungs expand, which prevents pneumonia.   Commonly Asked Questions  Will I have pain after surgery? Yes, you will have some  pain after your surgery, especially in the first few days. Your doctor and nurse will ask you about your pain often. You will be given medication to manage your pain as needed. If your pain is not relieved, please tell your doctor or nurse. It is important to control your pain so you can cough, breathe deeply, use your incentive spirometer, and get out of bed and walk.  Will I be able to eat? Yes, you will be able to eat a regular diet or eat as tolerated. You should start with foods that are soft and easy to digest such as apple sauce and chicken noodle soup. Eat small meals frequently, and then advance  to regular foods. If you experience bloating, gas, or cramps, limit high-fiber foods, including whole grain breads and cereal, nuts, seeds, salads, fresh fruit, broccoli, cabbage, and cauliflower. Will I have pain when I am home? The length of time each person has pain or discomfort varies. You may still have some pain when you go home and will probably be taking pain medication. Follow the guidelines below. . Take your medications as directed and as needed. . Call your doctor if the medication prescribed for you doesn't relieve your pain. . Don't drive or drink alcohol while you're taking prescription pain medication. . As your incision heals, you will have less pain and need less pain medication. A mild pain reliever such as acetaminophen (Tylenol) or ibuprofen (Advil) will relieve aches and discomfort. However, large quantities of acetaminophen may be harmful to your liver. Don't take more acetaminophen than the amount directed on the bottle or as instructed by your doctor or nurse. . Pain medication should help you as you resume your normal activities. Take enough medication to do your exercises comfortably. Pain medication is most effective 30 to 45 minutes after taking it. Marland Kitchen Keep track of when you take your pain medication. Taking it when your pain first begins is more effective than waiting for the pain to get worse. Pain medication may cause constipation (having fewer bowel movements than what is normal for you).  How can I prevent constipation? . Go to the bathroom at the same time every day. Your body will get used to going at that time. . If you feel the urge to go, don't put it off. Try to use the bathroom 5 to 15 minutes after meals. . After breakfast is a good time to move your bowels. The reflexes in your colon are strongest at this time. . Exercise, if you can. Walking is an excellent form of exercise. . Drink 8 (8-ounce) glasses (2 liters) of liquids daily, if you can. Drink  water, juices, soups, ice cream shakes, and other drinks that don't have caffeine. Drinks with caffeine, such as coffee and soda, pull fluid out of the body. . Slowly increase the fiber in your diet to 25 to 35 grams per day. Fruits, vegetables, whole grains, and cereals contain fiber. If you have an ostomy or have had recent bowel surgery, check with your doctor or nurse before making any changes in your diet. . Both over-the-counter and prescription medications are available to treat constipation. Start with 1 of the following over-the-counter medications first: o Docusate sodium (Colace) 100 mg. Take ___1__ capsules _2____ times a day. This is a stool softener that causes few side effects. Don't take it with mineral oil. o Polyethylene glycol (MiraLAX) 17 grams daily. o Senna (Senokot) 2 tablets at bedtime. This is a stimulant laxative, which can cause cramping. Marland Kitchen  If you haven't had a bowel movement in 2 days, call your doctor or nurse.  Can I shower? Yes, you should shower 24 hours after your surgery. Be sure to shower every day. Taking a warm shower is relaxing and can help decrease muscle aches. Use soap when you shower and gently wash your incision. Pat the areas dry with a towel after showering, and leave your incision uncovered (unless there is drainage). Call your doctor if you see any redness or drainage from your incision. Don't take tub baths until you discuss it with your doctor at the first appointment after your surgery. How do I care for my incisions? You will have several small incisions on your abdomen. The incisions are closed with Steri-Strips or Dermabond. You may also have square white dressings on your incisions (Primapore). You can remove these in the shower 24 hours after your surgery. You should clean your incisions with soap and water. If you go home with Steri-Strips on your incision, they will loosen and may fall off by themselves. If they haven't fallen off within 10  days, you can remove them. If you go home with Dermabond over your sutures (stitches), it will also loosen and peel off.  What are the most common symptoms after a hysterectomy? It's common for you to have some vaginal spotting or light bleeding. You should monitor this with a pad or a panty liner. If you have having heavy bleeding (bleeding through a pad or liner every 1 to 2 hours), call your doctor right away. It's also common to have some discomfort after surgery from the air that was pumped into your abdomen during surgery. To help with this, walk, drink plenty of liquids and make sure to take the stool softeners you received.  When is it safe for me to drive? You may resume driving 2 weeks after surgery, as long as you are not taking pain medication that may make you drowsy.  When can I resume sexual activity? Do not place anything in your vagina or have vaginal intercourse for 8 weeks after your surgery. Some people will need to wait longer than 8 weeks, so speak with your doctor before resuming sexual intercourse.  Will I be able to travel? Yes, you can travel. If you are traveling by plane within a few weeks after your surgery, make sure you get up and walk every hour. Be sure to stretch your legs, drink plenty of liquids, and keep your feet elevated when possible.  Will I need any supplies? Most people do not need any supplies after the surgery. In the rare case that you do need supplies, such as tubes or drains, your nurse will order them for you.  When can I return to work? The time it takes to return to work depends on the type of work you do, the type of surgery you had, and how fast your body heals. Most people can return to work about 2 to 4 weeks after the surgery.  What exercises can I do? Exercise will help you gain strength and feel better. Walking and stair climbing are excellent forms of exercise. Gradually increase the distance you walk. Climb stairs slowly, resting or  stopping as needed. Ask your doctor or nurse before starting more strenuous exercises.  When can I lift heavy objects? Most people should not lift anything heavier than 10 pounds (4.5 kilograms) for at least 4 weeks after surgery. Speak with your doctor about when you can do heavy lifting.  How can I cope with my feelings? After surgery for a serious illness, you may have new and upsetting feelings. Many people say they felt weepy, sad, worried, nervous, irritable, and angry at one time or another. You may find that you can't control some of these feelings. If this happens, it's a good idea to seek emotional support. The first step in coping is to talk about how you feel. Family and friends can help. Your nurse, doctor, and social worker can reassure, support, and guide you. It's always a good idea to let these professionals know how you, your family, and your friends are feeling emotionally. Many resources are available to patients and their families. Whether you're in the hospital or at home, the nurses, doctors, and social workers are here to help you and your family and friends handle the emotional aspects of your illness.  When is my first appointment after surgery? Your first appointment after surgery will be 2 to 4 weeks after surgery. Your nurse will give you instructions on how to make this appointment, including the phone number to call.  What if I have other questions? If you have any questions or concerns, please talk with your doctor or nurse. You can reach them Monday through Friday from 9:00 am to 5:00 pm. After 5:00 pm, during the weekend, and on holidays, call [phone] and ask for the doctor on call for your doctor.  . Have a temperature of 101 F (38.3 C) or higher . Have pain that does not get better with pain medication . Have redness, drainage, or swelling from your incisions

## 2020-10-08 ENCOUNTER — Telehealth: Payer: Self-pay

## 2020-10-08 ENCOUNTER — Encounter (HOSPITAL_COMMUNITY): Payer: Self-pay | Admitting: Gynecologic Oncology

## 2020-10-08 DIAGNOSIS — K635 Polyp of colon: Secondary | ICD-10-CM | POA: Diagnosis not present

## 2020-10-08 DIAGNOSIS — D120 Benign neoplasm of cecum: Secondary | ICD-10-CM | POA: Diagnosis not present

## 2020-10-08 NOTE — Telephone Encounter (Addendum)
Ms. Canizales states that she is eating,drinking, and urinating well. Burning with urination. Told her this will improve over time. Burning is from the foley catheter being in place. She is on a fluid restriction so she cannot increase fluid intake. She is tired. Pt getting up out of bed every hour and walking around the house. Told her that she needed to increace the walking aroud and being up to help prevent blood clots . Pt is coughing and deep breathing every hour.  Suggested she splint her abdomen with a pillow to he;p with abdominal pain. She is using tylenol 1000 mg q 6 hrs for pain.  Pain level is a 5/10. Suggested that she take a tramadol to get the pain level lower. Pt not able to take Ibuprofen.  She is having pain in right shoulder. Told her that was gas pain from the surgery.  She can apply heat to the right shoulder. She is not passing gas. She is having difficulty with swallowing the senokot-s. She will try to take it with applesauce tonight.  Suggested that she take Miralax 1 capful bid until first BM then adjust as needed. Ms Pagan asked if Na level could be checked at visit on 10-29-20 since she tends to run low. Reviewed with Joylene John, NP and that would need to be done with her PCP. A urine culture is all that will be checked on 10-29-20 to f/u UTI pre op. Afebrile. Incisions D&I Pt aware of post op appointments as well as the office number [phone] and after hours number [phone] to call if she has any questions or concerns

## 2020-10-10 LAB — SURGICAL PATHOLOGY

## 2020-10-13 ENCOUNTER — Telehealth: Payer: Self-pay

## 2020-10-13 DIAGNOSIS — R109 Unspecified abdominal pain: Secondary | ICD-10-CM

## 2020-10-13 DIAGNOSIS — R300 Dysuria: Secondary | ICD-10-CM

## 2020-10-13 NOTE — Telephone Encounter (Signed)
[name] states that she has been having some flank pain.  It is a 3/10.  She has not moved her bowels in a couple of days and it could be from that. She told the Miralax last week and had some diarrhea Sat.  Did not take any moore.  Afebrile  Occasional burning with urination.  She is concerned that she still may not have cleared the UTI that she had pre op. ATB finished 10-03-20. Reviewed above with Melissa Cross,NP. Pt to come in tomorrow at 1130 for urine specimen. Pt stated that while she was waiting for instructions she went to [facility] and had a good BM.

## 2020-10-14 ENCOUNTER — Inpatient Hospital Stay: Payer: Medicare Other

## 2020-10-14 ENCOUNTER — Inpatient Hospital Stay: Payer: Medicare Other | Attending: Gynecologic Oncology | Admitting: Gynecologic Oncology

## 2020-10-14 ENCOUNTER — Other Ambulatory Visit: Payer: Self-pay

## 2020-10-14 DIAGNOSIS — Z7189 Other specified counseling: Secondary | ICD-10-CM

## 2020-10-14 DIAGNOSIS — Z90722 Acquired absence of ovaries, bilateral: Secondary | ICD-10-CM

## 2020-10-14 DIAGNOSIS — C541 Malignant neoplasm of endometrium: Secondary | ICD-10-CM | POA: Insufficient documentation

## 2020-10-14 DIAGNOSIS — N390 Urinary tract infection, site not specified: Secondary | ICD-10-CM | POA: Diagnosis not present

## 2020-10-14 DIAGNOSIS — Z90710 Acquired absence of both cervix and uterus: Secondary | ICD-10-CM | POA: Diagnosis not present

## 2020-10-14 DIAGNOSIS — R109 Unspecified abdominal pain: Secondary | ICD-10-CM | POA: Insufficient documentation

## 2020-10-14 DIAGNOSIS — R300 Dysuria: Secondary | ICD-10-CM

## 2020-10-14 LAB — URINALYSIS, COMPLETE (UACMP) WITH MICROSCOPIC
Bacteria, UA: NONE SEEN
Bilirubin Urine: NEGATIVE
Glucose, UA: 50 mg/dL — AB
Ketones, ur: NEGATIVE mg/dL
Leukocytes,Ua: NEGATIVE
Nitrite: NEGATIVE
Protein, ur: NEGATIVE mg/dL
Specific Gravity, Urine: 1.012 (ref 1.005–1.030)
pH: 6 (ref 5.0–8.0)

## 2020-10-14 NOTE — Telephone Encounter (Signed)
Told Ms Salatino that the urinalysis is showing some blood in the urine which is expected after surgery. No bacteria was seen.  Will wait on urine culture results per Zoila Shutter Ms Degraaf verbalized understanding.

## 2020-10-14 NOTE — Progress Notes (Signed)
Gynecologic Oncology Telehealth Follow-up Note  I connected with [name] on 10/14/20 at  3:00 PM EST by telephone and verified that I am speaking with the correct person using two identifiers.  I discussed the limitations, risks, security and privacy concerns of performing an evaluation and management service by telemedicine and the availability of in-person appointments. I also discussed with the patient that there may be a patient responsible charge related to this service. The patient expressed understanding and agreed to proceed.  Other persons participating in the visit and their role in the encounter: none.  Patient's location: home Provider's location: [place]  Chief Complaint:  Chief Complaint  Patient presents with  . endometrial cancer    Counseling and coordination of care   CC:  Chief Complaint  Patient presents with  . endometrial cancer    Counseling and coordination of care    Assessment/Plan:  [name]  is a 74 y.o.  year old P3 with stage IA grade 1 endometrioid endometrial adenocarcinoma.  Pathology revealed low risk factors for recurrence, therefore no adjuvant therapy is recommended according to NCCN guidelines.  I discussed risk for recurrence and typical symptoms encouraged her to notify us of these should they develop between visits.  I recommend she have follow-up every 6 months for 5 years in accordance with NCCN guidelines. Those visits should include symptom assessment, physical exam and pelvic examination. Pap smears are not indicated or recommended in the routine surveillance of endometrial cancer.  HPI: [name] is a 74 year old P3 who was seen in consultation at the request of Dr Rip Harbour for evaluation and treatment of grade 1 endometrial cancer.   Her symptoms began 07/21/2020 with postmenopausal bleeding.  She saw the [facility] and then the [facility] women's assessment unit for this symptom as she  did not have a gynecologist on the same day.  A physical examination was performed but no additional testing.  Therefore she was referred for outpatient work-up with a scheduled ultrasound and appointment with a gynecologist in the following months.  Her first follow-up visit with the gynecologist was scheduled for 09/01/2020. Work-up of symptoms included a transvaginal ultrasound scan and endometrial Pipelle. Transvaginal US on 08/11/2020 showed a uterus measuring 7.4 x 2.6 x 4.4 cm with a 3 mm endometrial thickness.  The right and left ovaries were grossly normal. Endometrial sampling with endometrial Pipelle on 09/01/2020 and showed FIGO grade 1 endometrioid adenocarcinoma.   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  Dr. [name] is her interventional cardiologist and Dr. Marlou Porch is her cardiologist.  The patient has a history of a laparoscopic cholecystectomy and a tubal ligation.  Interval Hx:  On 10/08/20 she underwent robotic assisted total hysterectomy, BSO, SLN biopsy. Intraoperative findings were significant for 6cm normal appearing uterus, normal tubes and ovaries bilaterally, normal peritoneal cavity, no gross extra-uterine disease. Surgery was uncomplicated.  Final pathology revealed a FIGO grade 1 endometrioid endometrial adenocarcinoma, with no myometrial invasion, no LVSI, and negative nodes, cervix and adnexa. MMR in tact.  Since surgery she has done well with no major complaints.       Current Meds:  Outpatient Encounter Medications as of 10/14/2020  Medication Sig  . amLODipine (NORVASC) 5 MG tablet Take 5 mg by mouth daily.  Marland Kitchen aspirin 81 MG tablet Take 81 mg by mouth daily.   Ileene Patrick Products (  VITAMIN C) CHEW Chew 3 tablets by mouth daily.   . cefpodoxime (VANTIN) 100 MG tablet Take 1 tablet (100 mg total) by mouth 2 (two) times daily.  . Cholecalciferol (VITAMIN D) 125 MCG  (5000 UT) CAPS Take 5,000 Units by mouth daily.   . Cranberry 500 MG CHEW Chew 1,500 mg by mouth daily.  Marland Kitchen esomeprazole (NEXIUM) 40 MG capsule Take 40 mg by mouth 2 (two) times daily before a meal.   . fluticasone (FLONASE) 50 MCG/ACT nasal spray Place 2 sprays into both nostrils daily as needed for allergies or rhinitis.   Marland Kitchen levothyroxine (SYNTHROID, LEVOTHROID) 50 MCG tablet Take 50 mcg by mouth daily before breakfast.  . losartan (COZAAR) 50 MG tablet Take 50 mg by mouth in the morning and at bedtime.   Vladimir Faster Glyc-Propyl Glyc PF (SYSTANE HYDRATION PF) 0.4-0.3 % SOLN Place 1 drop into both eyes in the morning and at bedtime.  . Probiotic Product (PROBIOTIC DAILY PO) Take 1 capsule by mouth daily.  . psyllium (METAMUCIL) 58.6 % packet Take 1 packet by mouth at bedtime.  . rosuvastatin (CRESTOR) 5 MG tablet Take 5 mg by mouth daily.  Marland Kitchen senna-docusate (SENOKOT-S) 8.6-50 MG tablet Take 2 tablets by mouth at bedtime. For AFTER surgery only, do not take if having diarrhea  . traMADol (ULTRAM) 50 MG tablet Take 1 tablet (50 mg total) by mouth every 6 (six) hours as needed for severe pain. For AFTER surgery, do not take and drive   No facility-administered encounter medications on file as of 10/14/2020.    Allergy:  Allergies  Allergen Reactions  . Amoxicillin Rash  . Bactrim [Sulfamethoxazole-Trimethoprim] Other (See Comments)    Decreased sodium significantly   . Ciprofloxacin Other (See Comments)    Muscle aches/pains  . Clarithromycin Nausea And Vomiting  . Hydrochlorothiazide Other (See Comments)    Decreased Potassium and decreased Sodium  . Lipitor [Atorvastatin] Other (See Comments)    CPK  . Niaspan [Niacin Er] Other (See Comments)    CPK  . Triglide [Fenofibrate] Other (See Comments)    CPK  . Welchol [Colesevelam Hcl] Other (See Comments)    CPK  . Zetia [Ezetimibe] Other (See Comments)    CPK   . Zostavax [Zoster Vaccine Live]     Doesn't recall the reaction  .  Clindamycin/Lincomycin Rash    Social Hx:   Social History   Socioeconomic History  . Marital status: Married    Spouse name: Not on file  . Number of children: Not on file  . Years of education: Not on file  . Highest education level: Not on file  Occupational History  . Not on file  Tobacco Use  . Smoking status: Never Smoker  . Smokeless tobacco: Never Used  Vaping Use  . Vaping Use: Never used  Substance and Sexual Activity  . Alcohol use: Yes    Comment: 1-2 per year  . Drug use: No  . Sexual activity: Not Currently    Birth control/protection: Post-menopausal  Other Topics Concern  . Not on file  Social History Narrative   Lives in [place] with spouse   Retired,  Previously a Materials engineer   Social Determinants of Radio broadcast assistant Strain: Not on file  Food Insecurity: Not on file  Transportation Needs: Not on file  Physical Activity: Not on file  Stress: Not on file  Social Connections: Not on file  Intimate Partner Violence: Not on file    Past  Surgical Hx:  Past Surgical History:  Procedure Laterality Date  . CHOLECYSTECTOMY    . KNEE ARTHROSCOPY     meniscal tear   right  . PACEMAKER IMPLANT N/A 03/06/2020   Procedure: PACEMAKER IMPLANT;  Surgeon: [name], MD;  Location: [facility];  Service: Cardiovascular;  Laterality: N/A;  . ROBOTIC ASSISTED TOTAL HYSTERECTOMY WITH BILATERAL SALPINGO OOPHERECTOMY N/A 10/07/2020   Procedure: XI ROBOTIC ASSISTED TOTAL HYSTERECTOMY WITH BILATERAL SALPINGO OOPHORECTOMY;  Surgeon: Everitt Amber, MD;  Location: [facility];  Service: Gynecology;  Laterality: N/A;  . SENTINEL NODE BIOPSY N/A 10/07/2020   Procedure: SENTINEL NODE BIOPSY;  Surgeon: Everitt Amber, MD;  Location: [facility];  Service: Gynecology;  Laterality: N/A;  . TONSILLECTOMY    . TUBAL LIGATION      Past Medical Hx:  Past Medical History:  Diagnosis Date  . Acute meniscal tear of knee   . Anxiety   . Basal cell carcinoma    endometrial   cancer  . Colon polyp   . Diabetes ([place])    type 2  . Diverticulosis   . Edema   . Esophageal reflux   . Fibrocystic breast   . Hemorrhoids   . HTN (hypertension)   . Hyperlipidemia   . Hyponatremia   . Hypothyroidism   . OA (osteoarthritis) of hip   . Presence of permanent cardiac pacemaker    due to bradycardia  03-2020  . Seasonal allergies   . Sinus bradycardia   . Syncope    vagal,  in the setting of GI cramping    Past Gynecological History:  See HPI, SVD x 3 No LMP recorded. Patient is postmenopausal.  Family Hx:  Family History  Problem Relation Age of Onset  . Colon cancer Mother   . Hypertension Mother   . Heart disease Mother   . Coronary artery disease Mother   . Lung cancer Father   . Other Brother        CABG  . Breast cancer Sister   . Breast cancer Cousin   . Breast cancer Niece   . Ovarian cancer Neg Hx   . Endometrial cancer Neg Hx     Review of Systems:  Constitutional  Feels well,    ENT Normal appearing ears and nares bilaterally Skin/Breast  No rash, sores, jaundice, itching, dryness Cardiovascular  No chest pain, shortness of breath, or edema  Pulmonary  No cough or wheeze.  Gastro Intestinal  No nausea, vomitting, or diarrhoea. No bright red blood per rectum, no abdominal pain, change in bowel movement, or constipation.  Genito Urinary  No frequency, urgency, dysuria, + postmenopausal bleeding Musculo Skeletal  No myalgia, arthralgia, joint swelling or pain  Neurologic  No weakness, numbness, change in gait,  Psychology  No depression, anxiety, insomnia.   Vitals:  There were no vitals taken for this visit.  Physical Exam: Deferred.  I discussed the assessment and treatment plan with the patient. The patient was provided with an opportunity to ask questions and all were answered. The patient agreed with the plan and demonstrated an understanding of the instructions.   The patient was advised to call back or see an in-person  evaluation if the symptoms worsen or if the condition fails to improve as anticipated.   I provided 15 minutes of non face-to-face telephone visit time  during this encounter, and > 50% was spent counseling as documented under my assessment & plan.     Thereasa Solo, MD  10/14/2020, 4:05 PM

## 2020-10-15 ENCOUNTER — Ambulatory Visit
Admission: RE | Admit: 2020-10-15 | Discharge: 2020-10-15 | Disposition: A | Discharge status: Home / Self Care | Payer: Medicare Other | Source: Ambulatory Visit | Attending: Family Medicine | Admitting: Family Medicine

## 2020-10-15 DIAGNOSIS — Z1231 Encounter for screening mammogram for malignant neoplasm of breast: Secondary | ICD-10-CM | POA: Diagnosis not present

## 2020-10-15 DIAGNOSIS — Z0000 Encounter for general adult medical examination without abnormal findings: Secondary | ICD-10-CM

## 2020-10-15 NOTE — Telephone Encounter (Signed)
Told Ms Denman that the urine has a low colony count of the Klebsiella(10,000 colony count) on the current culture.  This is not usually treated unless the count is 100,000  or greater. Melissa Cross,NP will discuss with Dr.Rossi and she will be call with Dr. Serita Grit recommendations. Pt verbalized understanding.

## 2020-10-16 LAB — URINE CULTURE: Culture: 10000 — AB

## 2020-10-17 NOTE — Telephone Encounter (Signed)
Told [name] that Dr. Denman George said that no further antibiotics are needed. She does not need a follow up urinalysis or culture at post op visit 10-29-20. She would need to see PCP if she develops any symptoms. Pt verbalized understanding.

## 2020-10-27 NOTE — Progress Notes (Signed)
Gynecologic Oncology Follow-up Note  Chief Complaint:  Chief Complaint  Patient presents with  . Endometrial cancer, grade I ([place])   CC:  Chief Complaint  Patient presents with  . Endometrial cancer, grade I ([facility])    Assessment/Plan:  [name]  is a 74 y.o.  year old P3 with stage IA grade 1 endometrioid endometrial adenocarcinoma.  Pathology revealed low risk factors for recurrence, therefore no adjuvant therapy is recommended according to NCCN guidelines.  I discussed risk for recurrence and typical symptoms encouraged her to notify us of these should they develop between visits.  I recommend she have follow-up every 6 months for 5 years in accordance with NCCN guidelines. Those visits should include symptom assessment, physical exam and pelvic examination. Pap smears are not indicated or recommended in the routine surveillance of endometrial cancer.  HPI: [name] is a 74 year old P3 who was seen in consultation at the request of Dr Rip Harbour for evaluation and treatment of grade 1 endometrial cancer.   Her symptoms began 07/21/2020 with postmenopausal bleeding.  [facility] and then [facility] women's assessment unit for this symptom as she did not have a gynecologist on the same day.  A physical examination was performed but no additional testing.  Therefore she was referred for outpatient work-up with a scheduled ultrasound and appointment with a gynecologist in the following months.  Her first follow-up visit with the gynecologist was scheduled for 09/01/2020. Work-up of symptoms included a transvaginal ultrasound scan and endometrial Pipelle. Transvaginal US on 08/11/2020 showed a uterus measuring 7.4 x 2.6 x 4.4 cm with a 3 mm endometrial thickness.  The right and left ovaries were grossly normal. Endometrial sampling with endometrial Pipelle on 09/01/2020 and showed FIGO grade 1 endometrioid adenocarcinoma.   Interval Hx:  On 10/08/20 she  underwent robotic assisted total hysterectomy, BSO, SLN biopsy. Intraoperative findings were significant for 6cm normal appearing uterus, normal tubes and ovaries bilaterally, normal peritoneal cavity, no gross extra-uterine disease. Surgery was uncomplicated.  Final pathology revealed a FIGO grade 1 endometrioid endometrial adenocarcinoma, with no myometrial invasion, no LVSI, and negative nodes, cervix and adnexa. MMR in tact.  Since surgery she has done well with no major complaints.       Current Meds:  Outpatient Encounter Medications as of 10/29/2020  Medication Sig  . amLODipine (NORVASC) 5 MG tablet Take 5 mg by mouth daily.  Marland Kitchen aspirin 81 MG tablet Take 81 mg by mouth daily.   Marland Kitchen Bioflavonoid Products (VITAMIN C) CHEW Chew 3 tablets by mouth daily.   . cefpodoxime (VANTIN) 100 MG tablet Take 1 tablet (100 mg total) by mouth 2 (two) times daily.  . Cholecalciferol (VITAMIN D) 125 MCG (5000 UT) CAPS Take 5,000 Units by mouth daily.   . Cranberry 500 MG CHEW Chew 1,500 mg by mouth daily.  Marland Kitchen esomeprazole (NEXIUM) 40 MG capsule Take 40 mg by mouth 2 (two) times daily before a meal.   . fluticasone (FLONASE) 50 MCG/ACT nasal spray Place 2 sprays into both nostrils daily as needed for allergies or rhinitis.   Marland Kitchen levothyroxine (SYNTHROID, LEVOTHROID) 50 MCG tablet Take 50 mcg by mouth daily before breakfast.  . losartan (COZAAR) 50 MG tablet Take 50 mg by mouth in the morning and at bedtime.   Vladimir Faster Glyc-Propyl Glyc PF (SYSTANE HYDRATION PF) 0.4-0.3 % SOLN Place 1 drop into both eyes in the morning and at bedtime.  . Probiotic Product (  PROBIOTIC DAILY PO) Take 1 capsule by mouth daily.  . psyllium (METAMUCIL) 58.6 % packet Take 1 packet by mouth at bedtime.  . rosuvastatin (CRESTOR) 5 MG tablet Take 5 mg by mouth daily.  Marland Kitchen senna-docusate (SENOKOT-S) 8.6-50 MG tablet Take 2 tablets by mouth at bedtime. For AFTER surgery only, do not take if having diarrhea  . traMADol (ULTRAM) 50 MG tablet  Take 1 tablet (50 mg total) by mouth every 6 (six) hours as needed for severe pain. For AFTER surgery, do not take and drive   No facility-administered encounter medications on file as of 10/29/2020.    Allergy:  Allergies  Allergen Reactions  . Amoxicillin Rash    Other reaction(s): rash  . Sulfamethoxazole-Trimethoprim Other (See Comments)    Decreased sodium significantly  Other reaction(s): low sodium  . Ciprofloxacin Other (See Comments)    Muscle aches/pains  . Atorvastatin Other (See Comments)    CPK Other reaction(s): increase CPK  . Clarithromycin Nausea And Vomiting    Other reaction(s): vomiting  . Ezetimibe Other (See Comments)    CPK  Other reaction(s): increase CPK  . Fenofibrate Other (See Comments)    CPK Other reaction(s): increase CPK  . Hydrochlorothiazide Other (See Comments)    Decreased Potassium and decreased Sodium Other reaction(s): hyponatremia, hypokalemia  . Niaspan [Niacin Er] Other (See Comments)    CPK  . Welchol [Colesevelam Hcl] Other (See Comments)    CPK  . Zoster Vaccine Live     Doesn't recall the reaction Other reaction(s): unknown  . Clindamycin/Lincomycin Rash    Social Hx:   Social History   Socioeconomic History  . Marital status: Married    Spouse name: Not on file  . Number of children: Not on file  . Years of education: Not on file  . Highest education level: Not on file  Occupational History  . Not on file  Tobacco Use  . Smoking status: Never Smoker  . Smokeless tobacco: Never Used  Vaping Use  . Vaping Use: Never used  Substance and Sexual Activity  . Alcohol use: Yes    Comment: 1-2 per year  . Drug use: No  . Sexual activity: Not Currently    Birth control/protection: Post-menopausal  Other Topics Concern  . Not on file  Social History Narrative   Lives in [place] with spouse   Retired,  Previously a Materials engineer   Social Determinants of Radio broadcast assistant Strain: Not on file  Food  Insecurity: Not on file  Transportation Needs: Not on file  Physical Activity: Not on file  Stress: Not on file  Social Connections: Not on file  Intimate Partner Violence: Not on file    Past Surgical Hx:  Past Surgical History:  Procedure Laterality Date  . CHOLECYSTECTOMY    . KNEE ARTHROSCOPY     meniscal tear   right  . PACEMAKER IMPLANT N/A 03/06/2020   Procedure: PACEMAKER IMPLANT;  Surgeon: [name], MD;  Location: [place] [facility];  Service: Cardiovascular;  Laterality: N/A;  . ROBOTIC ASSISTED TOTAL HYSTERECTOMY WITH BILATERAL SALPINGO OOPHERECTOMY N/A 10/07/2020   Procedure: XI ROBOTIC ASSISTED TOTAL HYSTERECTOMY WITH BILATERAL SALPINGO OOPHORECTOMY;  Surgeon: Everitt Amber, MD;  Location: [facility];  Service: Gynecology;  Laterality: N/A;  . SENTINEL NODE BIOPSY N/A 10/07/2020   Procedure: SENTINEL NODE BIOPSY;  Surgeon: Everitt Amber, MD;  Location: [facility];  Service: Gynecology;  Laterality: N/A;  . TONSILLECTOMY    . TUBAL LIGATION  Past Medical Hx:  Past Medical History:  Diagnosis Date  . Acute meniscal tear of knee   . Anxiety   . Basal cell carcinoma    endometrial  cancer  . Colon polyp   . Diabetes ([place])    type 2  . Diverticulosis   . Edema   . Esophageal reflux   . Fibrocystic breast   . Hemorrhoids   . HTN (hypertension)   . Hyperlipidemia   . Hyponatremia   . Hypothyroidism   . OA (osteoarthritis) of hip   . Presence of permanent cardiac pacemaker    due to bradycardia  03-2020  . Seasonal allergies   . Sinus bradycardia   . Syncope    vagal,  in the setting of GI cramping    Past Gynecological History:  See HPI, SVD x 3 No LMP recorded. Patient is postmenopausal.  Family Hx:  Family History  Problem Relation Age of Onset  . Colon cancer Mother   . Hypertension Mother   . Heart disease Mother   . Coronary artery disease Mother   . Lung cancer Father   . Other Brother        CABG  . Breast cancer Sister   . Breast cancer Cousin    . Breast cancer Niece   . Ovarian cancer Neg Hx   . Endometrial cancer Neg Hx     Review of Systems:  Constitutional  Feels well,    ENT Normal appearing ears and nares bilaterally Skin/Breast  No rash, sores, jaundice, itching, dryness Cardiovascular  No chest pain, shortness of breath, or edema  Pulmonary  No cough or wheeze.  Gastro Intestinal  No nausea, vomitting, or diarrhoea. No bright red blood per rectum, no abdominal pain, change in bowel movement, or constipation.  Genito Urinary  No frequency, urgency, dysuria, no bleeding Musculo Skeletal  No myalgia, arthralgia, joint swelling or pain  Neurologic  No weakness, numbness, change in gait,  Psychology  No depression, anxiety, insomnia.   Vitals:  Blood pressure (!) 151/62, pulse 60, temperature (!) 97.3 F (36.3 C), temperature source Tympanic, resp. rate 17, height _0  (1.651 m), weight 178 lb 12.8 oz (81.1 kg), SpO2 99 %.  Physical Exam: WD in NAD Neck  Supple NROM, without any enlargements.  Lymph Node Survey No cervical supraclavicular or inguinal adenopathy Cardiovascular  Well perfused peripheries.  Lungs  No increased WOB Skin  No rash/lesions/breakdown  Psychiatry  Alert and oriented to person, place, and time  Abdomen  Normoactive bowel sounds, abdomen soft, non-tender and obese without evidence of hernia.  Back No CVA tenderness Genito Urinary  Vaginal cuff in tact, suture material present, no active bleeding.  Rectal  deferred Extremities  No bilateral cyanosis, clubbing or edema.   30 minutes of direct face to face counseling time was spent with the patient. This included discussion about prognosis, therapy recommendations and postoperative side effects and are beyond the scope of routine postoperative care.   Thereasa Solo, MD  10/29/2020, 3:40 PM

## 2020-10-28 ENCOUNTER — Encounter: Payer: Self-pay | Admitting: Gynecologic Oncology

## 2020-10-29 ENCOUNTER — Inpatient Hospital Stay (HOSPITAL_BASED_OUTPATIENT_CLINIC_OR_DEPARTMENT_OTHER): Payer: Medicare Other | Admitting: Gynecologic Oncology

## 2020-10-29 ENCOUNTER — Other Ambulatory Visit: Payer: Medicare Other

## 2020-10-29 ENCOUNTER — Other Ambulatory Visit: Payer: Self-pay

## 2020-10-29 ENCOUNTER — Encounter: Payer: Self-pay | Admitting: Gynecologic Oncology

## 2020-10-29 VITALS — BP 151/62 | HR 60 | Temp 97.3°F | Resp 17 | Ht 65.0 in | Wt 178.8 lb

## 2020-10-29 DIAGNOSIS — Z90722 Acquired absence of ovaries, bilateral: Secondary | ICD-10-CM | POA: Diagnosis not present

## 2020-10-29 DIAGNOSIS — N390 Urinary tract infection, site not specified: Secondary | ICD-10-CM

## 2020-10-29 DIAGNOSIS — Z7189 Other specified counseling: Secondary | ICD-10-CM

## 2020-10-29 DIAGNOSIS — C541 Malignant neoplasm of endometrium: Secondary | ICD-10-CM

## 2020-10-29 DIAGNOSIS — R109 Unspecified abdominal pain: Secondary | ICD-10-CM | POA: Diagnosis not present

## 2020-10-29 DIAGNOSIS — Z90710 Acquired absence of both cervix and uterus: Secondary | ICD-10-CM

## 2020-10-29 NOTE — Patient Instructions (Signed)
Dr Andrey Farmer recommends that you follow-up in 6 months to see her for a cancer check for your stage 1 endometrial cancer.  Please refrain from sexual intercourse for at least another 5 weeks.  It is safe to bathe.  Please avoid heavy lifting for 1 more week.  Please call Dr. Andrey Farmer at [phone] with questions.

## 2020-12-01 ENCOUNTER — Telehealth: Payer: Self-pay

## 2020-12-01 NOTE — Telephone Encounter (Signed)
Told [name] that the vaginal spotting is normal.  The sutures in the vaginal cuff begin to dissolve ~6-8 weeks after surgery and cause light spotting.   She is to call if the bleeding increases like a period. Pt verbalized understanding.

## 2020-12-04 ENCOUNTER — Ambulatory Visit (INDEPENDENT_AMBULATORY_CARE_PROVIDER_SITE_OTHER): Payer: Medicare Other

## 2020-12-04 DIAGNOSIS — R001 Bradycardia, unspecified: Secondary | ICD-10-CM

## 2020-12-04 LAB — CUP PACEART REMOTE DEVICE CHECK
Battery Remaining Longevity: 118 mo
Battery Remaining Percentage: 95.5 %
Battery Voltage: 3.02 V
Brady Statistic AP VP Percent: 1 %
Brady Statistic AP VS Percent: 94 %
Brady Statistic AS VP Percent: 1 %
Brady Statistic AS VS Percent: 5 %
Brady Statistic RA Percent Paced: 94 %
Brady Statistic RV Percent Paced: 1 %
Date Time Interrogation Session: 20220203053447
Implantable Lead Implant Date: 20210506
Implantable Lead Implant Date: 20210506
Implantable Lead Location: 753859
Implantable Lead Location: 753860
Implantable Pulse Generator Implant Date: 20210506
Lead Channel Impedance Value: 510 Ohm
Lead Channel Impedance Value: 550 Ohm
Lead Channel Pacing Threshold Amplitude: 0.5 V
Lead Channel Pacing Threshold Amplitude: 0.75 V
Lead Channel Pacing Threshold Pulse Width: 0.5 ms
Lead Channel Pacing Threshold Pulse Width: 0.5 ms
Lead Channel Sensing Intrinsic Amplitude: 1.5 mV
Lead Channel Sensing Intrinsic Amplitude: 12 mV
Lead Channel Setting Pacing Amplitude: 1 V
Lead Channel Setting Pacing Amplitude: 2 V
Lead Channel Setting Pacing Pulse Width: 0.5 ms
Lead Channel Setting Sensing Sensitivity: 2 mV
Pulse Gen Model: 2272
Pulse Gen Serial Number: 3825689

## 2020-12-10 NOTE — Progress Notes (Signed)
Remote pacemaker transmission.   

## 2020-12-11 DIAGNOSIS — Z8542 Personal history of malignant neoplasm of other parts of uterus: Secondary | ICD-10-CM | POA: Diagnosis not present

## 2020-12-11 DIAGNOSIS — K219 Gastro-esophageal reflux disease without esophagitis: Secondary | ICD-10-CM | POA: Diagnosis not present

## 2020-12-11 DIAGNOSIS — M545 Low back pain, unspecified: Secondary | ICD-10-CM | POA: Diagnosis not present

## 2020-12-11 DIAGNOSIS — I10 Essential (primary) hypertension: Secondary | ICD-10-CM | POA: Diagnosis not present

## 2020-12-11 DIAGNOSIS — Z950 Presence of cardiac pacemaker: Secondary | ICD-10-CM | POA: Diagnosis not present

## 2020-12-11 DIAGNOSIS — Z0000 Encounter for general adult medical examination without abnormal findings: Secondary | ICD-10-CM | POA: Diagnosis not present

## 2020-12-11 DIAGNOSIS — J309 Allergic rhinitis, unspecified: Secondary | ICD-10-CM | POA: Diagnosis not present

## 2020-12-11 DIAGNOSIS — E039 Hypothyroidism, unspecified: Secondary | ICD-10-CM | POA: Diagnosis not present

## 2020-12-11 DIAGNOSIS — E119 Type 2 diabetes mellitus without complications: Secondary | ICD-10-CM | POA: Diagnosis not present

## 2020-12-11 DIAGNOSIS — E7800 Pure hypercholesterolemia, unspecified: Secondary | ICD-10-CM | POA: Diagnosis not present

## 2021-01-15 DIAGNOSIS — H43811 Vitreous degeneration, right eye: Secondary | ICD-10-CM | POA: Diagnosis not present

## 2021-01-26 DIAGNOSIS — H43813 Vitreous degeneration, bilateral: Secondary | ICD-10-CM | POA: Diagnosis not present

## 2021-01-26 DIAGNOSIS — H40023 Open angle with borderline findings, high risk, bilateral: Secondary | ICD-10-CM | POA: Diagnosis not present

## 2021-02-17 DIAGNOSIS — R350 Frequency of micturition: Secondary | ICD-10-CM | POA: Diagnosis not present

## 2021-03-17 DIAGNOSIS — R350 Frequency of micturition: Secondary | ICD-10-CM | POA: Diagnosis not present

## 2021-03-17 DIAGNOSIS — N3000 Acute cystitis without hematuria: Secondary | ICD-10-CM | POA: Diagnosis not present

## 2021-03-23 DIAGNOSIS — N3020 Other chronic cystitis without hematuria: Secondary | ICD-10-CM | POA: Diagnosis not present

## 2021-04-08 DIAGNOSIS — N3020 Other chronic cystitis without hematuria: Secondary | ICD-10-CM | POA: Diagnosis not present

## 2021-04-08 DIAGNOSIS — N139 Obstructive and reflux uropathy, unspecified: Secondary | ICD-10-CM | POA: Diagnosis not present

## 2021-04-16 ENCOUNTER — Ambulatory Visit (INDEPENDENT_AMBULATORY_CARE_PROVIDER_SITE_OTHER): Payer: Medicare Other

## 2021-04-16 DIAGNOSIS — Z950 Presence of cardiac pacemaker: Secondary | ICD-10-CM | POA: Diagnosis not present

## 2021-04-16 LAB — CUP PACEART REMOTE DEVICE CHECK
Battery Remaining Longevity: 124 mo
Battery Remaining Percentage: 95.5 %
Battery Voltage: 3.02 V
Brady Statistic AP VP Percent: 1 %
Brady Statistic AP VS Percent: 95 %
Brady Statistic AS VP Percent: 1 %
Brady Statistic AS VS Percent: 4.4 %
Brady Statistic RA Percent Paced: 94 %
Brady Statistic RV Percent Paced: 1 %
Date Time Interrogation Session: 20220615150440
Implantable Lead Implant Date: 20210506
Implantable Lead Implant Date: 20210506
Implantable Lead Location: 753859
Implantable Lead Location: 753860
Implantable Pulse Generator Implant Date: 20210506
Lead Channel Impedance Value: 510 Ohm
Lead Channel Impedance Value: 550 Ohm
Lead Channel Pacing Threshold Amplitude: 0.5 V
Lead Channel Pacing Threshold Amplitude: 0.75 V
Lead Channel Pacing Threshold Pulse Width: 0.5 ms
Lead Channel Pacing Threshold Pulse Width: 0.5 ms
Lead Channel Sensing Intrinsic Amplitude: 1.4 mV
Lead Channel Sensing Intrinsic Amplitude: 12 mV
Lead Channel Setting Pacing Amplitude: 1 V
Lead Channel Setting Pacing Amplitude: 2 V
Lead Channel Setting Pacing Pulse Width: 0.5 ms
Lead Channel Setting Sensing Sensitivity: 2 mV
Pulse Gen Model: 2272
Pulse Gen Serial Number: 3825689

## 2021-04-21 ENCOUNTER — Other Ambulatory Visit: Payer: Self-pay

## 2021-04-21 ENCOUNTER — Inpatient Hospital Stay: Payer: Medicare Other | Attending: Gynecologic Oncology | Admitting: Gynecologic Oncology

## 2021-04-21 VITALS — BP 143/73 | HR 66 | Temp 97.4°F | Resp 16 | Ht 65.0 in | Wt 177.4 lb

## 2021-04-21 DIAGNOSIS — C541 Malignant neoplasm of endometrium: Secondary | ICD-10-CM | POA: Insufficient documentation

## 2021-04-21 DIAGNOSIS — Z7982 Long term (current) use of aspirin: Secondary | ICD-10-CM | POA: Insufficient documentation

## 2021-04-21 DIAGNOSIS — Z79899 Other long term (current) drug therapy: Secondary | ICD-10-CM | POA: Insufficient documentation

## 2021-04-21 DIAGNOSIS — N390 Urinary tract infection, site not specified: Secondary | ICD-10-CM | POA: Insufficient documentation

## 2021-04-21 DIAGNOSIS — Z90710 Acquired absence of both cervix and uterus: Secondary | ICD-10-CM | POA: Insufficient documentation

## 2021-04-21 DIAGNOSIS — Z90722 Acquired absence of ovaries, bilateral: Secondary | ICD-10-CM | POA: Insufficient documentation

## 2021-04-21 MED ORDER — ESTRADIOL 0.1 MG/GM VA CREA: 1.0000 | TOPICAL_CREAM | VAGINAL | 12 refills | Status: DC

## 2021-04-21 NOTE — Patient Instructions (Signed)
Dr Denman George has prescribed vaginal estrogen cream to use at night (3 times per week). Apply this right before bed (otherwise it will all run out as it melts).  Dr Denman George felt some scar in the right vaginal corner. She recommends that you return in 3 months for her to feel that this area is softening up.  Please notify Dr Denman George at phone number [phone] if you notice vaginal bleeding, new pelvic or abdominal pains, bloating, feeling full easy, or a change in bladder or bowel function.

## 2021-04-21 NOTE — Progress Notes (Signed)
Gynecologic Oncology Follow-up Note  Chief Complaint:  Chief Complaint  Patient presents with   Endometrial cancer, grade I [facility])   CC:  Chief Complaint  Patient presents with   Endometrial cancer, grade I ([place])    Assessment/Plan:  [name]  is a 75 y.o.  year old P3 with stage IA grade 1 endometrioid endometrial adenocarcinoma, s/p staging in November, 2021.  Pathology revealed low risk factors for recurrence, therefore no adjuvant therapy is recommended according to NCCN guidelines.  I discussed risk for recurrence and typical symptoms encouraged her to notify us of these should they develop between visits.  She has a small granuloma/9mm mass in right vaginal fornix - recommend 3 month follow-up to re-examine. Will biopsy if it is larger and amenable to biopsy (it is not currently large enough for this).  Prescribed vaginal premarin for recurrent UTI's.   I recommend she have follow-up every 6 months for 5 years in accordance with NCCN guidelines. Those visits should include symptom assessment, physical exam and pelvic examination. Pap smears are not indicated or recommended in the routine surveillance of endometrial cancer.  HPI: [name] is a 75 year old P3 who was seen in consultation at the request of Dr Rip Harbour for evaluation and treatment of grade 1 endometrial cancer.   Her symptoms began 07/21/2020 with postmenopausal bleeding.  [facility] and then [facility] women's assessment unit for this symptom as she did not have a gynecologist on the same day.  A physical examination was performed but no additional testing.  Therefore she was referred for outpatient work-up with a scheduled ultrasound and appointment with a gynecologist in the following months.  Her first follow-up visit with the gynecologist was scheduled for 09/01/2020. Work-up of symptoms included a transvaginal ultrasound scan and endometrial Pipelle. Transvaginal US on  08/11/2020 showed a uterus measuring 7.4 x 2.6 x 4.4 cm with a 3 mm endometrial thickness.  The right and left ovaries were grossly normal. Endometrial sampling with endometrial Pipelle on 09/01/2020 and showed FIGO grade 1 endometrioid adenocarcinoma.   On 10/08/20 she underwent robotic assisted total hysterectomy, BSO, SLN biopsy. Intraoperative findings were significant for 6cm normal appearing uterus, normal tubes and ovaries bilaterally, normal peritoneal cavity, no gross extra-uterine disease. Surgery was uncomplicated.  Final pathology revealed a FIGO grade 1 endometrioid endometrial adenocarcinoma, with no myometrial invasion, no LVSI, and negative nodes, cervix and adnexa. MMR in tact.  Interval Hx:  She returns for routine surveillance and reported chronic UTI's (now on macrobid prophylaxis, recommended vaginal estrogen if deemed safe by gyn onc). She reported slowness with arousal and sharp pains (occasionally) with arousal - this symptoms is improving over time.       Current Meds:  Outpatient Encounter Medications as of 04/21/2021  Medication Sig   [START ON 04/22/2021] estradiol (ESTRACE VAGINAL) 0.1 MG/GM vaginal cream Place 1 Applicatorful vaginally 3 (three) times a week.   amLODipine (NORVASC) 5 MG tablet Take 5 mg by mouth daily.   aspirin 81 MG tablet Take 81 mg by mouth daily.    Bioflavonoid Products (VITAMIN C) CHEW Chew 3 tablets by mouth daily.    cefpodoxime (VANTIN) 100 MG tablet Take 1 tablet (100 mg total) by mouth 2 (two) times daily.   Cholecalciferol (VITAMIN D) 125 MCG (5000 UT) CAPS Take 5,000 Units by mouth daily.    Cranberry 500 MG CHEW Chew 1,500 mg by mouth daily.   esomeprazole (NEXIUM) 40  MG capsule Take 40 mg by mouth 2 (two) times daily before a meal.    fluticasone (FLONASE) 50 MCG/ACT nasal spray Place 2 sprays into both nostrils daily as needed for allergies or rhinitis.    levothyroxine (SYNTHROID, LEVOTHROID) 50 MCG tablet Take 50 mcg by mouth daily  before breakfast.   losartan (COZAAR) 50 MG tablet Take 50 mg by mouth in the morning and at bedtime.    Polyethyl Glyc-Propyl Glyc PF (SYSTANE HYDRATION PF) 0.4-0.3 % SOLN Place 1 drop into both eyes in the morning and at bedtime.   Probiotic Product (PROBIOTIC DAILY PO) Take 1 capsule by mouth daily.   psyllium (METAMUCIL) 58.6 % packet Take 1 packet by mouth at bedtime.   rosuvastatin (CRESTOR) 5 MG tablet Take 5 mg by mouth daily.   senna-docusate (SENOKOT-S) 8.6-50 MG tablet Take 2 tablets by mouth at bedtime. For AFTER surgery only, do not take if having diarrhea   traMADol (ULTRAM) 50 MG tablet Take 1 tablet (50 mg total) by mouth every 6 (six) hours as needed for severe pain. For AFTER surgery, do not take and drive   No facility-administered encounter medications on file as of 04/21/2021.    Allergy:  Allergies  Allergen Reactions   Amoxicillin Rash    Other reaction(s): rash   Sulfamethoxazole-Trimethoprim Other (See Comments)    Decreased sodium significantly  Other reaction(s): low sodium   Ciprofloxacin Other (See Comments)    Muscle aches/pains   Atorvastatin Other (See Comments)    CPK Other reaction(s): increase CPK   Clarithromycin Nausea And Vomiting    Other reaction(s): vomiting   Ezetimibe Other (See Comments)    CPK  Other reaction(s): increase CPK   Fenofibrate Other (See Comments)    CPK Other reaction(s): increase CPK   Hydrochlorothiazide Other (See Comments)    Decreased Potassium and decreased Sodium Other reaction(s): hyponatremia, hypokalemia   Niaspan [Niacin Er] Other (See Comments)    CPK   Welchol [Colesevelam Hcl] Other (See Comments)    CPK   Zoster Vaccine Live     Doesn't recall the reaction Other reaction(s): unknown   Clindamycin/Lincomycin Rash    Social Hx:   Social History   Socioeconomic History   Marital status: Married    Spouse name: Not on file   Number of children: Not on file   Years of education: Not on file    Highest education level: Not on file  Occupational History   Not on file  Tobacco Use   Smoking status: Never   Smokeless tobacco: Never  Vaping Use   Vaping Use: Never used  Substance and Sexual Activity   Alcohol use: Yes    Comment: 1-2 per year   Drug use: No   Sexual activity: Not Currently    Birth control/protection: Post-menopausal  Other Topics Concern   Not on file  Social History Narrative   Lives in [place] with spouse   Retired,  Previously a Materials engineer   Social Determinants of Radio broadcast assistant Strain: Not on file  Food Insecurity: Not on file  Transportation Needs: Not on file  Physical Activity: Not on file  Stress: Not on file  Social Connections: Not on file  Intimate Partner Violence: Not on file    Past Surgical Hx:  Past Surgical History:  Procedure Laterality Date   CHOLECYSTECTOMY     KNEE ARTHROSCOPY     meniscal tear   right   PACEMAKER IMPLANT N/A 03/06/2020  Procedure: PACEMAKER IMPLANT;  Surgeon: [name], MD;  Location: [place] [facility];  Service: Cardiovascular;  Laterality: N/A;   ROBOTIC ASSISTED TOTAL HYSTERECTOMY WITH BILATERAL SALPINGO OOPHERECTOMY N/A 10/07/2020   Procedure: XI ROBOTIC ASSISTED TOTAL HYSTERECTOMY WITH BILATERAL SALPINGO OOPHORECTOMY;  Surgeon: Everitt Amber, MD;  Location: [facility];  Service: Gynecology;  Laterality: N/A;   SENTINEL NODE BIOPSY N/A 10/07/2020   Procedure: SENTINEL NODE BIOPSY;  Surgeon: Everitt Amber, MD;  Location: [facility];  Service: Gynecology;  Laterality: N/A;   TONSILLECTOMY     TUBAL LIGATION      Past Medical Hx:  Past Medical History:  Diagnosis Date   Acute meniscal tear of knee    Anxiety    Basal cell carcinoma    endometrial  cancer   Colon polyp    Diabetes ([facility])    type 2   Diverticulosis    Edema    Esophageal reflux    Fibrocystic breast    Hemorrhoids    HTN (hypertension)    Hyperlipidemia    Hyponatremia    Hypothyroidism    OA (osteoarthritis) of hip     Presence of permanent cardiac pacemaker    due to bradycardia  03-2020   Seasonal allergies    Sinus bradycardia    Syncope    vagal,  in the setting of GI cramping    Past Gynecological History:  See HPI, SVD x 3 No LMP recorded. Patient is postmenopausal.  Family Hx:  Family History  Problem Relation Age of Onset   Colon cancer Mother    Hypertension Mother    Heart disease Mother    Coronary artery disease Mother    Lung cancer Father    Other Brother        CABG   Breast cancer Sister    Breast cancer Cousin    Breast cancer Niece    Ovarian cancer Neg Hx    Endometrial cancer Neg Hx     Review of Systems:  Constitutional  Feels well,    ENT Normal appearing ears and nares bilaterally Skin/Breast  No rash, sores, jaundice, itching, dryness Cardiovascular  No chest pain, shortness of breath, or edema  Pulmonary  No cough or wheeze.  Gastro Intestinal  No nausea, vomitting, or diarrhoea. No bright red blood per rectum, no abdominal pain, change in bowel movement, or constipation.  Genito Urinary  No frequency, urgency, dysuria, no bleeding. + chronic UTI's, difficulty becoming aroused.  Musculo Skeletal  No myalgia, arthralgia, joint swelling or pain  Neurologic  No weakness, numbness, change in gait,  Psychology  No depression, anxiety, insomnia.   Vitals:  Blood pressure (!) 143/73, pulse 66, temperature (!) 97.4 F (36.3 C), temperature source Tympanic, resp. rate 16, height $RemoveBe'5\' 5"'[id number]$  (1.651 m), weight 177 lb 6.4 oz (80.5 kg), SpO2 97 %.  Physical Exam: WD in NAD Neck  Supple NROM, without any enlargements.  Lymph Node Survey No cervical supraclavicular or inguinal adenopathy Cardiovascular  Well perfused peripheries.  Lungs  No increased WOB Skin  No rash/lesions/breakdown  Psychiatry  Alert and oriented to person, place, and time  Abdomen  Normoactive bowel sounds, abdomen soft, non-tender and obese without evidence of hernia.  Soft  incisions Back No CVA tenderness Genito Urinary  Vaginal cuff without visible masses. There is a 1-58mm spherical nodule in the right vaginal fornix. ? Stitch granuloma? Too small to biopsy and non-visible on speculum exam.  Rectal  deferred Extremities  No bilateral cyanosis,  clubbing or edema.   Molly Solo, MD  04/21/2021, 2:45 PM

## 2021-04-30 DIAGNOSIS — N3020 Other chronic cystitis without hematuria: Secondary | ICD-10-CM | POA: Diagnosis not present

## 2021-05-07 NOTE — Progress Notes (Signed)
Remote pacemaker transmission.   

## 2021-05-22 DIAGNOSIS — R252 Cramp and spasm: Secondary | ICD-10-CM | POA: Diagnosis not present

## 2021-05-23 ENCOUNTER — Inpatient Hospital Stay (HOSPITAL_COMMUNITY)
Admission: EM | Admit: 2021-05-23 | Discharge: 2021-05-28 | DRG: 644 | Disposition: A | Discharge status: Home / Self Care | Payer: Medicare Other | Attending: Internal Medicine | Admitting: Internal Medicine

## 2021-05-23 ENCOUNTER — Other Ambulatory Visit: Payer: Self-pay

## 2021-05-23 DIAGNOSIS — K219 Gastro-esophageal reflux disease without esophagitis: Secondary | ICD-10-CM | POA: Diagnosis present

## 2021-05-23 DIAGNOSIS — Z20822 Contact with and (suspected) exposure to covid-19: Secondary | ICD-10-CM | POA: Diagnosis present

## 2021-05-23 DIAGNOSIS — E785 Hyperlipidemia, unspecified: Secondary | ICD-10-CM | POA: Diagnosis present

## 2021-05-23 DIAGNOSIS — E119 Type 2 diabetes mellitus without complications: Secondary | ICD-10-CM | POA: Diagnosis not present

## 2021-05-23 DIAGNOSIS — Z85828 Personal history of other malignant neoplasm of skin: Secondary | ICD-10-CM

## 2021-05-23 DIAGNOSIS — I493 Ventricular premature depolarization: Secondary | ICD-10-CM | POA: Diagnosis present

## 2021-05-23 DIAGNOSIS — Z79890 Hormone replacement therapy (postmenopausal): Secondary | ICD-10-CM

## 2021-05-23 DIAGNOSIS — I10 Essential (primary) hypertension: Secondary | ICD-10-CM | POA: Diagnosis not present

## 2021-05-23 DIAGNOSIS — E871 Hypo-osmolality and hyponatremia: Secondary | ICD-10-CM

## 2021-05-23 DIAGNOSIS — Z950 Presence of cardiac pacemaker: Secondary | ICD-10-CM

## 2021-05-23 DIAGNOSIS — E039 Hypothyroidism, unspecified: Secondary | ICD-10-CM | POA: Diagnosis present

## 2021-05-23 DIAGNOSIS — K2970 Gastritis, unspecified, without bleeding: Secondary | ICD-10-CM | POA: Diagnosis present

## 2021-05-23 DIAGNOSIS — Z8249 Family history of ischemic heart disease and other diseases of the circulatory system: Secondary | ICD-10-CM

## 2021-05-23 DIAGNOSIS — Z800 Family history of malignant neoplasm of digestive organs: Secondary | ICD-10-CM

## 2021-05-23 DIAGNOSIS — Z803 Family history of malignant neoplasm of breast: Secondary | ICD-10-CM

## 2021-05-23 DIAGNOSIS — Z801 Family history of malignant neoplasm of trachea, bronchus and lung: Secondary | ICD-10-CM

## 2021-05-23 DIAGNOSIS — Z87440 Personal history of urinary (tract) infections: Secondary | ICD-10-CM

## 2021-05-23 DIAGNOSIS — N179 Acute kidney failure, unspecified: Secondary | ICD-10-CM | POA: Diagnosis not present

## 2021-05-23 DIAGNOSIS — Z79899 Other long term (current) drug therapy: Secondary | ICD-10-CM

## 2021-05-23 DIAGNOSIS — Z90710 Acquired absence of both cervix and uterus: Secondary | ICD-10-CM

## 2021-05-23 DIAGNOSIS — E222 Syndrome of inappropriate secretion of antidiuretic hormone: Secondary | ICD-10-CM | POA: Diagnosis not present

## 2021-05-23 DIAGNOSIS — R0789 Other chest pain: Secondary | ICD-10-CM | POA: Diagnosis not present

## 2021-05-23 DIAGNOSIS — Z8542 Personal history of malignant neoplasm of other parts of uterus: Secondary | ICD-10-CM

## 2021-05-23 DIAGNOSIS — R531 Weakness: Secondary | ICD-10-CM | POA: Diagnosis not present

## 2021-05-23 DIAGNOSIS — Z7982 Long term (current) use of aspirin: Secondary | ICD-10-CM

## 2021-05-23 LAB — URINALYSIS, ROUTINE W REFLEX MICROSCOPIC
Bilirubin Urine: NEGATIVE
Glucose, UA: NEGATIVE mg/dL
Hgb urine dipstick: NEGATIVE
Ketones, ur: 5 mg/dL — AB
Leukocytes,Ua: NEGATIVE
Nitrite: NEGATIVE
Protein, ur: NEGATIVE mg/dL
Specific Gravity, Urine: 1.021 (ref 1.005–1.030)
pH: 5 (ref 5.0–8.0)

## 2021-05-23 LAB — COMPREHENSIVE METABOLIC PANEL
ALT: 16 U/L (ref 0–44)
AST: 18 U/L (ref 15–41)
Albumin: 4.1 g/dL (ref 3.5–5.0)
Alkaline Phosphatase: 76 U/L (ref 38–126)
Anion gap: 9 (ref 5–15)
BUN: 19 mg/dL (ref 8–23)
CO2: 22 mmol/L (ref 22–32)
Calcium: 9.2 mg/dL (ref 8.9–10.3)
Chloride: 95 mmol/L — ABNORMAL LOW (ref 98–111)
Creatinine, Ser: 1.04 mg/dL — ABNORMAL HIGH (ref 0.44–1.00)
GFR, Estimated: 57 mL/min — ABNORMAL LOW (ref >60)
Glucose, Bld: 201 mg/dL — ABNORMAL HIGH (ref 70–99)
Potassium: 4.7 mmol/L (ref 3.5–5.1)
Sodium: 126 mmol/L — ABNORMAL LOW (ref 135–145)
Total Bilirubin: 0.6 mg/dL (ref 0.3–1.2)
Total Protein: 6.6 g/dL (ref 6.5–8.1)

## 2021-05-23 LAB — CBC WITH DIFFERENTIAL/PLATELET
Abs Immature Granulocytes: 0.02 10*3/uL (ref 0.00–0.07)
Basophils Absolute: 0 10*3/uL (ref 0.0–0.1)
Basophils Relative: 1 %
Eosinophils Absolute: 0.2 10*3/uL (ref 0.0–0.5)
Eosinophils Relative: 3 %
HCT: 42.9 % (ref 36.0–46.0)
Hemoglobin: 14.5 g/dL (ref 12.0–15.0)
Immature Granulocytes: 0 %
Lymphocytes Relative: 24 %
Lymphs Abs: 1.7 10*3/uL (ref 0.7–4.0)
MCH: 29.5 pg (ref 26.0–34.0)
MCHC: 33.8 g/dL (ref 30.0–36.0)
MCV: 87.2 fL (ref 80.0–100.0)
Monocytes Absolute: 0.6 10*3/uL (ref 0.1–1.0)
Monocytes Relative: 8 %
Neutro Abs: 4.7 10*3/uL (ref 1.7–7.7)
Neutrophils Relative %: 64 %
Platelets: 223 10*3/uL (ref 150–400)
RBC: 4.92 MIL/uL (ref 3.87–5.11)
RDW: 13.3 % (ref 11.5–15.5)
WBC: 7.3 10*3/uL (ref 4.0–10.5)
nRBC: 0 % (ref 0.0–0.2)

## 2021-05-23 LAB — MAGNESIUM: Magnesium: 2 mg/dL (ref 1.7–2.4)

## 2021-05-23 LAB — TSH: TSH: 1.74 u[IU]/mL (ref 0.350–4.500)

## 2021-05-23 NOTE — ED Triage Notes (Signed)
Pt had blood work drawn at PCP and was called and told sodium (127)  and calcium (8.4) levels were low. Reports muscle cramps and headache since Wednesday.

## 2021-05-23 NOTE — ED Provider Notes (Signed)
Emergency Medicine Provider Triage Evaluation Note  [name] , a 75 y.o. female  was evaluated in triage.  Pt complains of generalized body aches, weakness, fatigue.  Patient went to a walk-in clinic yesterday, had low sodium and calcium.  Has had issues with sodium in the past.  She is not on diuretics, however remains on blood pressure medication.  No recent fevers.  No urinary symptoms or cough.  No normal bowel movements.  No blood in her stool or urine.  Review of Systems  Positive: Fatigue, myalgias, HA Negative: fever  Physical Exam  BP (!) 149/74 (BP Location: Left Arm)   Pulse 69   Temp 98.5 F (36.9 C) (Oral)   Resp 15   SpO2 97%  Gen:   Awake, no distress   Resp:  Normal effort  MSK:   Moves extremities without difficulty   Medical Decision Making  Medically screening exam initiated at 3:11 PM.  Appropriate orders placed.  TEXANNA MCCLAUGHERTY was informed that the remainder of the evaluation will be completed by another provider, this initial triage assessment does not replace that evaluation, and the importance of remaining in the ED until their evaluation is complete.  Labs, xray, and ua ordered   Franchot Heidelberg, Hershal Coria 05/23/21 1512    Luna Fuse, MD 05/24/21 1108

## 2021-05-24 ENCOUNTER — Encounter (HOSPITAL_COMMUNITY): Payer: Self-pay | Admitting: Internal Medicine

## 2021-05-24 DIAGNOSIS — E871 Hypo-osmolality and hyponatremia: Secondary | ICD-10-CM | POA: Diagnosis not present

## 2021-05-24 DIAGNOSIS — I10 Essential (primary) hypertension: Secondary | ICD-10-CM

## 2021-05-24 LAB — CBC
HCT: 41.8 % (ref 36.0–46.0)
Hemoglobin: 14.4 g/dL (ref 12.0–15.0)
MCH: 29.6 pg (ref 26.0–34.0)
MCHC: 34.4 g/dL (ref 30.0–36.0)
MCV: 86 fL (ref 80.0–100.0)
Platelets: 217 10*3/uL (ref 150–400)
RBC: 4.86 MIL/uL (ref 3.87–5.11)
RDW: 13.2 % (ref 11.5–15.5)
WBC: 7.6 10*3/uL (ref 4.0–10.5)
nRBC: 0 % (ref 0.0–0.2)

## 2021-05-24 LAB — BASIC METABOLIC PANEL
Anion gap: 11 (ref 5–15)
Anion gap: 8 (ref 5–15)
BUN: 17 mg/dL (ref 8–23)
BUN: 20 mg/dL (ref 8–23)
CO2: 24 mmol/L (ref 22–32)
CO2: 23 mmol/L (ref 22–32)
Calcium: 9.2 mg/dL (ref 8.9–10.3)
Calcium: 9.5 mg/dL (ref 8.9–10.3)
Chloride: 95 mmol/L — ABNORMAL LOW (ref 98–111)
Chloride: 95 mmol/L — ABNORMAL LOW (ref 98–111)
Creatinine, Ser: 0.87 mg/dL (ref 0.44–1.00)
Creatinine, Ser: 0.96 mg/dL (ref 0.44–1.00)
GFR, Estimated: >60 mL/min (ref >60)
GFR, Estimated: >60 mL/min (ref >60)
Glucose, Bld: 124 mg/dL — ABNORMAL HIGH (ref 70–99)
Glucose, Bld: 133 mg/dL — ABNORMAL HIGH (ref 70–99)
Potassium: 4 mmol/L (ref 3.5–5.1)
Potassium: 4.5 mmol/L (ref 3.5–5.1)
Sodium: 130 mmol/L — ABNORMAL LOW (ref 135–145)
Sodium: 126 mmol/L — ABNORMAL LOW (ref 135–145)

## 2021-05-24 LAB — RESP PANEL BY RT-PCR (FLU A&B, COVID) ARPGX2
Influenza A by PCR: NEGATIVE
Influenza B by PCR: NEGATIVE
SARS Coronavirus 2 by RT PCR: NEGATIVE

## 2021-05-24 LAB — BRAIN NATRIURETIC PEPTIDE: B Natriuretic Peptide: 21.5 pg/mL (ref 0.0–100.0)

## 2021-05-24 LAB — CBG MONITORING, ED
Glucose-Capillary: 121 mg/dL — ABNORMAL HIGH (ref 70–99)
Glucose-Capillary: 167 mg/dL — ABNORMAL HIGH (ref 70–99)

## 2021-05-24 LAB — OSMOLALITY: Osmolality: 275 mOsm/kg (ref 275–295)

## 2021-05-24 LAB — OSMOLALITY, URINE: Osmolality, Ur: 740 mOsm/kg (ref 300–900)

## 2021-05-24 LAB — NA AND K (SODIUM & POTASSIUM), RAND UR
Potassium Urine: 32 mmol/L
Sodium, Ur: 109 mmol/L

## 2021-05-24 LAB — TSH: TSH: 3.509 u[IU]/mL (ref 0.350–4.500)

## 2021-05-24 LAB — GLUCOSE, CAPILLARY
Glucose-Capillary: 154 mg/dL — ABNORMAL HIGH (ref 70–99)
Glucose-Capillary: 186 mg/dL — ABNORMAL HIGH (ref 70–99)

## 2021-05-24 LAB — URIC ACID: Uric Acid, Serum: 3.4 mg/dL (ref 2.5–7.1)

## 2021-05-24 LAB — SODIUM, URINE, RANDOM: Sodium, Ur: 53 mmol/L

## 2021-05-24 LAB — CORTISOL: Cortisol, Plasma: 12.1 ug/dL

## 2021-05-24 MED ORDER — ALUM & MAG HYDROXIDE-SIMETH 200-200-20 MG/5ML PO SUSP
30.0000 mL | Freq: Four times a day (QID) | ORAL | Status: DC | PRN
  Administered 2021-05-24: 30 mL via ORAL
  Filled 2021-05-24: qty 30

## 2021-05-24 MED ORDER — INSULIN ASPART 100 UNIT/ML IJ SOLN: 0.0000 [IU] | Freq: Three times a day (TID) | INTRAMUSCULAR | Status: DC

## 2021-05-24 MED ORDER — ENOXAPARIN SODIUM 40 MG/0.4ML IJ SOSY
40.0000 mg | PREFILLED_SYRINGE | INTRAMUSCULAR | Status: DC
  Administered 2021-05-24 – 2021-05-27 (×4): 40 mg via SUBCUTANEOUS
  Filled 2021-05-24 (×5): qty 0.4

## 2021-05-24 MED ORDER — ACETAMINOPHEN 325 MG PO TABS: 650.0000 mg | ORAL_TABLET | Freq: Four times a day (QID) | ORAL | Status: DC | PRN

## 2021-05-24 MED ORDER — HYDRALAZINE HCL 20 MG/ML IJ SOLN: 10.0000 mg | INTRAMUSCULAR | Status: DC | PRN

## 2021-05-24 MED ORDER — ACETAMINOPHEN 650 MG RE SUPP: 650.0000 mg | Freq: Four times a day (QID) | RECTAL | Status: DC | PRN

## 2021-05-24 MED ORDER — ROSUVASTATIN CALCIUM 5 MG PO TABS
5.0000 mg | ORAL_TABLET | Freq: Every day | ORAL | Status: DC
  Administered 2021-05-24 – 2021-05-28 (×5): 5 mg via ORAL
  Filled 2021-05-24 (×5): qty 1

## 2021-05-24 MED ORDER — ASPIRIN 81 MG PO CHEW
81.0000 mg | CHEWABLE_TABLET | Freq: Every day | ORAL | Status: DC
  Administered 2021-05-24 – 2021-05-28 (×5): 81 mg via ORAL
  Filled 2021-05-24 (×5): qty 1

## 2021-05-24 MED ORDER — LOSARTAN POTASSIUM 50 MG PO TABS: 50.0000 mg | ORAL_TABLET | Freq: Every day | ORAL | Status: DC

## 2021-05-24 MED ORDER — LEVOTHYROXINE SODIUM 50 MCG PO TABS
50.0000 ug | ORAL_TABLET | Freq: Every day | ORAL | Status: DC
  Administered 2021-05-24 – 2021-05-28 (×5): 50 ug via ORAL
  Filled 2021-05-24: qty 2
  Filled 2021-05-24 (×2): qty 1
  Filled 2021-05-24: qty 2
  Filled 2021-05-24 (×2): qty 1

## 2021-05-24 MED ORDER — PANTOPRAZOLE SODIUM 40 MG PO TBEC
40.0000 mg | DELAYED_RELEASE_TABLET | Freq: Every day | ORAL | Status: DC
Start: 2021-05-24 — End: 2021-05-28
  Administered 2021-05-24 – 2021-05-28 (×5): 40 mg via ORAL
  Filled 2021-05-24 (×5): qty 1

## 2021-05-24 MED ORDER — AMLODIPINE BESYLATE 5 MG PO TABS
5.0000 mg | ORAL_TABLET | Freq: Every day | ORAL | Status: DC
  Administered 2021-05-24 – 2021-05-28 (×5): 5 mg via ORAL
  Filled 2021-05-24 (×5): qty 1

## 2021-05-24 MED ORDER — ONDANSETRON HCL 4 MG/2ML IJ SOLN
4.0000 mg | Freq: Four times a day (QID) | INTRAMUSCULAR | Status: DC | PRN
  Administered 2021-05-24: 4 mg via INTRAVENOUS
  Filled 2021-05-24: qty 2

## 2021-05-24 NOTE — Evaluation (Signed)
Occupational Therapy Evaluation Patient Details Name: [name] MRN: [id number] DOB: 04-16-1946 Today's Date: 05/24/2021    History of Present Illness This 75 y.o. 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  PMH includes: endometrial CA s/p hysterectomy, HTN, DM, bradycardia s/p pacemaker   Clinical Impression   Patient evaluated by Occupational Therapy with no further acute OT needs identified. All education has been completed and the patient has no further questions. Pt appears to be at baseline and is able to complete ADLs and ambulate in hallway independently.  See below for any follow-up Occupational Therapy or equipment needs. OT is signing off. Thank you for this referral.     Follow Up Recommendations  No OT follow up    Equipment Recommendations  None recommended by OT    Recommendations for Other Services       Precautions / Restrictions Precautions Precautions: None      Mobility Bed Mobility Overal bed mobility: Independent                  Transfers Overall transfer level: Independent                    Balance Overall balance assessment: No apparent balance deficits (not formally assessed)                                         ADL either performed or assessed with clinical judgement   ADL Overall ADL's : Independent                                             Vision Baseline Vision/History: Wears glasses Wears Glasses: At all times Patient Visual Report: No change from baseline       Perception     Praxis      Pertinent Vitals/Pain Pain Assessment: No/denies pain     Hand Dominance Right   Extremity/Trunk Assessment Upper Extremity Assessment Upper Extremity Assessment: Overall WFL for tasks assessed   Lower Extremity Assessment Lower Extremity Assessment: Overall WFL for tasks assessed   Cervical / Trunk Assessment Cervical / Trunk Assessment: Normal    Communication Communication Communication: No difficulties   Cognition Arousal/Alertness: Awake/alert Behavior During Therapy: WFL for tasks assessed/performed Overall Cognitive Status: Within Functional Limits for tasks assessed                                     General Comments  VSS.  Encouraged pt to ambulate frequently while in hospital    Exercises     Shoulder Instructions      Home Living Family/patient expects to be discharged to:: Private residence Living Arrangements: Children;Spouse/significant other Available Help at Discharge: Family;Available 24 hours/day Type of Home: House Home Access: Stairs to enter [employer] of Steps: 3   Home Layout: One level     Bathroom Shower/Tub: Teacher, early years/pre: Standard                Prior Functioning/Environment Level of Independence: Independent                 OT Problem List: Decreased activity tolerance      OT Treatment/Interventions:  OT Goals(Current goals can be found in the care plan section) Acute Rehab OT Goals Patient Stated Goal: to go home tomorrow OT Goal Formulation: All assessment and education complete, DC therapy  OT Frequency:     Barriers to D/C:            Co-evaluation              AM-PAC OT "6 Clicks" Daily Activity     Outcome Measure Help from another person eating meals?: None Help from another person taking care of personal grooming?: None Help from another person toileting, which includes using toliet, bedpan, or urinal?: None Help from another person bathing (including washing, rinsing, drying)?: None Help from another person to put on and taking off regular upper body clothing?: None Help from another person to put on and taking off regular lower body clothing?: None 6 Click Score: 24   End of Session Nurse Communication: Mobility status  Activity Tolerance: Patient tolerated treatment well Patient left: in  bed;with call bell/phone within reach  OT Visit Diagnosis: Unsteadiness on feet (R26.81)                Time: [id number] OT Time Calculation (min): 21 min Charges:  OT General Charges $OT Visit: 1 Visit OT Evaluation $OT Eval Low Complexity: 1 Low  Nilsa Nutting., OTR/L Acute Rehabilitation Services Pager [phone] Office [phone]   Lucille Passy M 05/24/2021, 3:54 PM

## 2021-05-24 NOTE — Progress Notes (Signed)
   Patient seen and examined at bedside, patient admitted after midnight, please see earlier detailed admission note by Rise Patience, MD. Briefly, patient presented secondary to low sodium as directed by her doctor for a sodium of 127. Sodium of 126 on admission likely secondary to SIADH. Fluid restricted diet started.  Subjective: No concerns this morning  BP 120/68   Pulse (!) 59   Temp 98.3 F (36.8 C) (Oral)   Resp 19   SpO2 98%   General exam: Appears calm and comfortable Respiratory system: Clear to auscultation. Respiratory effort normal. Cardiovascular system: S1 & S2 heard, RRR. No murmurs, rubs, gallops or clicks. Gastrointestinal system: Abdomen is nondistended, soft and nontender. No organomegaly or masses felt. Normal bowel sounds heard. Central nervous system: Alert and oriented. No focal neurological deficits. Musculoskeletal: No edema. No calf tenderness Skin: No cyanosis. No rashes Psychiatry: Judgement and insight appear normal. Mood & affect appropriate.   Brief assessment/Plan:  Hyponatremia Urine studies suggest SIADH picture. Normal TSH and cortisol. Corrected sodium for glucose of 128. -Continue fluid restricted diet (< 800 mL) -Check BMP this evening and tomorrow morning -PT/OT eval  Family communication: None at bedside DVT prophylaxis: Lovenox Disposition: Discharge likely tomorrow if sodium stabilizes  Cordelia Poche, MD [facility] 05/24/2021, 11:25 AM

## 2021-05-24 NOTE — H&P (Signed)
History and Physical    [name] [id number] DOB: 1946-07-31 DOA: 05/23/2021  PCP: Shirline Frees, MD  Patient coming from: Home.  Chief Complaint: Low sodium.  HPI: [name] is a 75 y.o. female with history of endometrial carcinoma status post surgery being followed by GYN, hypertension, diabetes mellitus type 2 has been experiencing muscle cramps and weakness for the last 1 week with some frontal headaches had gone to her PCP and had blood drawn which showed a sodium around 127 was instructed to come to the ER.  Patient states she is restricting fluids herself for the last many months.  Denies nausea vomiting or diarrhea.  Patient was admitted in December 2020 when patient sodium was around 109 and at that time patient was on hydrochlorothiazide since then discontinued.  Has not taken hydrochlorothiazide after that.  ED Course: In the ER patient's sodium was 126 creatinine 1.04 CBC unremarkable.  Patient's urine sodium is 53 and urine osmolality 740.  TSH is 1.74.  Patient's symptoms may be likely from SIADH.  Admitted for further observation.  COVID test was negative.  Review of Systems: As per HPI, rest all negative.   Past Medical History:  Diagnosis Date   Acute meniscal tear of knee    Anxiety    Basal cell carcinoma    endometrial  cancer   Colon polyp    Diabetes ([place])    type 2   Diverticulosis    Edema    Esophageal reflux    Fibrocystic breast    Hemorrhoids    HTN (hypertension)    Hyperlipidemia    Hyponatremia    Hypothyroidism    OA (osteoarthritis) of hip    Presence of permanent cardiac pacemaker    due to bradycardia  03-2020   Seasonal allergies    Sinus bradycardia    Syncope    vagal,  in the setting of GI cramping    Past Surgical History:  Procedure Laterality Date   CHOLECYSTECTOMY     KNEE ARTHROSCOPY     meniscal tear   right   PACEMAKER IMPLANT N/A 03/06/2020   Procedure: PACEMAKER IMPLANT;  Surgeon: [name], MD;   Location: [facility];  Service: Cardiovascular;  Laterality: N/A;   ROBOTIC ASSISTED TOTAL HYSTERECTOMY WITH BILATERAL SALPINGO OOPHERECTOMY N/A 10/07/2020   Procedure: XI ROBOTIC ASSISTED TOTAL HYSTERECTOMY WITH BILATERAL SALPINGO OOPHORECTOMY;  Surgeon: Everitt Amber, MD;  Location: [facility];  Service: Gynecology;  Laterality: N/A;   SENTINEL NODE BIOPSY N/A 10/07/2020   Procedure: SENTINEL NODE BIOPSY;  Surgeon: Everitt Amber, MD;  Location: [facility];  Service: Gynecology;  Laterality: N/A;   TONSILLECTOMY     TUBAL LIGATION       reports that she has never smoked. She has never used smokeless tobacco. She reports current alcohol use. She reports that she does not use drugs.  Allergies  Allergen Reactions   Amoxicillin Rash    Other reaction(s): rash   Sulfamethoxazole-Trimethoprim Other (See Comments)    Decreased sodium significantly  Other reaction(s): low sodium   Ciprofloxacin Other (See Comments)    Muscle aches/pains   Atorvastatin Other (See Comments)    CPK Other reaction(s): increase CPK   Clarithromycin Nausea And Vomiting    Other reaction(s): vomiting   Ezetimibe Other (See Comments)    CPK  Other reaction(s): increase CPK   Fenofibrate Other (See Comments)    CPK Other reaction(s): increase CPK   Hydrochlorothiazide Other (See Comments)  Decreased Potassium and decreased Sodium Other reaction(s): hyponatremia, hypokalemia   Niaspan [Niacin Er] Other (See Comments)    CPK   Welchol [Colesevelam Hcl] Other (See Comments)    CPK   Zoster Vaccine Live     Doesn't recall the reaction Other reaction(s): unknown   Clindamycin/Lincomycin Rash    Family History  Problem Relation Age of Onset   Colon cancer Mother    Hypertension Mother    Heart disease Mother    Coronary artery disease Mother    Lung cancer Father    Other Brother        CABG   Breast cancer Sister    Breast cancer Cousin    Breast cancer Niece    Ovarian cancer Neg Hx    Endometrial  cancer Neg Hx     Prior to Admission medications   Medication Sig Start Date End Date Taking? Authorizing Provider  amLODipine (NORVASC) 5 MG tablet Take 5 mg by mouth daily.    [provider]  aspirin 81 MG tablet Take 81 mg by mouth daily.     [provider]  Bioflavonoid Products (VITAMIN C) CHEW Chew 3 tablets by mouth daily.     [provider]  cefpodoxime (VANTIN) 100 MG tablet Take 1 tablet (100 mg total) by mouth 2 (two) times daily. 09/26/20   Joylene John D, NP  Cholecalciferol (VITAMIN D) 125 MCG (5000 UT) CAPS Take 5,000 Units by mouth daily.     [provider]  Cranberry 500 MG CHEW Chew 1,500 mg by mouth daily.    [provider]  esomeprazole (NEXIUM) 40 MG capsule Take 40 mg by mouth 2 (two) times daily before a meal.  11/08/18   [provider]  estradiol (ESTRACE VAGINAL) 0.1 MG/GM vaginal cream Place 1 Applicatorful vaginally 3 (three) times a week. 04/22/21   Everitt Amber, MD  fluticasone [facility]) 50 MCG/ACT nasal spray Place 2 sprays into both nostrils daily as needed for allergies or rhinitis.     [provider]  levothyroxine (SYNTHROID, LEVOTHROID) 50 MCG tablet Take 50 mcg by mouth daily before breakfast.    [provider]  losartan (COZAAR) 50 MG tablet Take 50 mg by mouth in the morning and at bedtime.     [provider]  Polyethyl Glyc-Propyl Glyc PF (SYSTANE HYDRATION PF) 0.4-0.3 % SOLN Place 1 drop into both eyes in the morning and at bedtime.    [provider]  Probiotic Product (PROBIOTIC DAILY PO) Take 1 capsule by mouth daily.    [provider]  psyllium (METAMUCIL) 58.6 % packet Take 1 packet by mouth at bedtime.    [provider]  rosuvastatin (CRESTOR) 5 MG tablet Take 5 mg by mouth daily.    [provider]  senna-docusate (SENOKOT-S) 8.6-50 MG tablet Take 2 tablets by mouth at bedtime. For AFTER surgery only, do not take if having  diarrhea 09/17/20   Cross, Lenna Sciara D, NP  traMADol (ULTRAM) 50 MG tablet Take 1 tablet (50 mg total) by mouth every 6 (six) hours as needed for severe pain. For AFTER surgery, do not take and drive [id number]   Dorothyann Gibbs, NP    Physical Exam: Constitutional: Moderately built and nourished. Vitals:   05/23/21 1407 05/23/21 1742 05/23/21 2133 05/24/21 0101  BP: (!) 149/74 131/72 (!) 153/77 (!) 150/72  Pulse: 69 60 60 88  Resp: '15 14 16 17  '$ Temp: 98.5 F (36.9 C) 98.4 F (36.9  C)  98.6 F (37 C)  TempSrc: Oral Oral    SpO2: 97% 100% 100% 98%   Eyes: Anicteric no pallor. ENMT: No discharge from the ears eyes nose and mouth. Neck: No mass felt.  No neck rigidity. Respiratory: No rhonchi or crepitations. Cardiovascular: S1-S2 heard. Abdomen: Soft nontender bowel sound present. Musculoskeletal: Mild edema. Skin: No rash. Neurologic: Alert awake oriented time place and person.  Moves all extremities. Psychiatric: Appears normal.  Normal affect.   Labs on Admission: I have personally reviewed following labs and imaging studies  CBC: Recent Labs  Lab 05/23/21 1503  WBC 7.3  NEUTROABS 4.7  HGB 14.5  HCT 42.9  MCV 87.2  PLT [id number]   Basic Metabolic Panel: Recent Labs  Lab 05/23/21 1506  NA 126*  K 4.7  CL 95*  CO2 22  GLUCOSE 201*  BUN 19  CREATININE 1.04*  CALCIUM 9.2  MG 2.0   GFR: CrCl cannot be calculated (Unknown ideal weight.). Liver Function Tests: Recent Labs  Lab 05/23/21 1506  AST 18  ALT 16  ALKPHOS 76  BILITOT 0.6  PROT 6.6  ALBUMIN 4.1   No results for input(s): LIPASE, AMYLASE in the last 168 hours. No results for input(s): AMMONIA in the last 168 hours. Coagulation Profile: No results for input(s): INR, PROTIME in the last 168 hours. Cardiac Enzymes: No results for input(s): CKTOTAL, CKMB, CKMBINDEX, TROPONINI in the last 168 hours. BNP (last 3 results) No results for input(s): PROBNP in the last 8760 hours. HbA1C: No results for  input(s): HGBA1C in the last 72 hours. CBG: No results for input(s): GLUCAP in the last 168 hours. Lipid Profile: No results for input(s): CHOL, HDL, LDLCALC, TRIG, CHOLHDL, LDLDIRECT in the last 72 hours. Thyroid Function Tests: Recent Labs    05/23/21 1511  TSH 1.740   Anemia Panel: No results for input(s): VITAMINB12, FOLATE, FERRITIN, TIBC, IRON, RETICCTPCT in the last 72 hours. Urine analysis:    Component Value Date/Time   COLORURINE YELLOW 05/23/2021 2100   APPEARANCEUR HAZY (A) 05/23/2021 2100   LABSPEC 1.021 05/23/2021 2100   PHURINE 5.0 05/23/2021 2100   GLUCOSEU NEGATIVE 05/23/2021 2100   HGBUR NEGATIVE 05/23/2021 2100   BILIRUBINUR NEGATIVE 05/23/2021 2100   KETONESUR 5 (A) 05/23/2021 2100   PROTEINUR NEGATIVE 05/23/2021 2100   UROBILINOGEN 0.2 10/13/2019 1554   NITRITE NEGATIVE 05/23/2021 2100   LEUKOCYTESUR NEGATIVE 05/23/2021 2100   Sepsis Labs: '@LABRCNTIP'$ (procalcitonin:4,lacticidven:4) ) Recent Results (from the past 240 hour(s))  Resp Panel by RT-PCR (Flu A&B, Covid) Nasopharyngeal Swab     Status: None   Collection Time: 05/24/21  3:15 AM   Specimen: Nasopharyngeal Swab; Nasopharyngeal(NP) swabs in vial transport medium  Result Value Ref Range Status   SARS Coronavirus 2 by RT PCR NEGATIVE NEGATIVE Final    Comment: (NOTE) SARS-CoV-2 target nucleic acids are NOT DETECTED.  The SARS-CoV-2 RNA is generally detectable in upper respiratory specimens during the acute phase of infection. The lowest concentration of SARS-CoV-2 viral copies this assay can detect is 138 copies/mL. A negative result does not preclude SARS-Cov-2 infection and should not be used as the sole basis for treatment or other patient management decisions. A negative result may occur with  improper specimen collection/handling, submission of specimen other than nasopharyngeal swab, presence of viral mutation(s) within the areas targeted by this assay, and inadequate number of  viral copies(<138 copies/mL). A negative result must be combined with clinical observations, patient history, and epidemiological information. The expected result is  Negative.  Fact Sheet for Patients:  EntrepreneurPulse.com.au  Fact Sheet for Healthcare Providers:  IncredibleEmployment.be  This test is no t yet approved or cleared by the [place] FDA and  has been authorized for detection and/or diagnosis of SARS-CoV-2 by FDA under an Emergency Use Authorization (EUA). This EUA will remain  in effect (meaning this test can be used) for the duration of the COVID-19 declaration under Section 564(b)(1) of the Act, 21 U.S.C.section 360bbb-3(b)(1), unless the authorization is terminated  or revoked sooner.       Influenza A by PCR NEGATIVE NEGATIVE Final   Influenza B by PCR NEGATIVE NEGATIVE Final    Comment: (NOTE) The Xpert Xpress SARS-CoV-2/FLU/RSV plus assay is intended as an aid in the diagnosis of influenza from Nasopharyngeal swab specimens and should not be used as a sole basis for treatment. Nasal washings and aspirates are unacceptable for Xpert Xpress SARS-CoV-2/FLU/RSV testing.  Fact Sheet for Patients: EntrepreneurPulse.com.au  Fact Sheet for Healthcare Providers: IncredibleEmployment.be  This test is not yet approved or cleared by the [place] FDA and has been authorized for detection and/or diagnosis of SARS-CoV-2 by FDA under an Emergency Use Authorization (EUA). This EUA will remain in effect (meaning this test can be used) for the duration of the COVID-19 declaration under Section 564(b)(1) of the Act, 21 U.S.C. section 360bbb-3(b)(1), unless the authorization is terminated or revoked.  Performed at [facility], [place] [address], [place], [place] [address]      Radiological Exams on Admission: No results found.  EKG: Independently reviewed.  Paced  rhythm.  Assessment/Plan Principal Problem:   Hyponatremia Active Problems:   Essential hypertension, benign   Diabetes ([place])    Hyponatremia -given the elevated sodium and urine osmolality suspect patient could be likely having SIADH.  We will keep patient on fluid restriction follow metabolic panel closely in addition we will also check uric acid level TSH and cortisol level. Hypertension we will continue amlodipine.  Holding patient's Cozaar for now his creatinine is increased from baseline.  We will see metabolic panel does not show any worsening will restart ARB.  As needed IV hydralazine for systolic more than 0000000 has been ordered. Diabetes mellitus type 2 not on any medication we will keep patient on sliding scale coverage closely follow CBGs. Hypothyroidism Synthroid checking TSH. History of endometrial carcinoma status post surgery last December and being followed by GYN ONC.   DVT prophylaxis: Lovenox. Code Status: Full code. Family Communication: Discussed with patient. Disposition Plan: Home. Consults called: None. Admission status: Observation.   Rise Patience MD [facility] Pager [phone].  If 7PM-7AM, please contact night-coverage [email or web address] Password [facility]  05/24/2021, 6:03 AM

## 2021-05-24 NOTE — ED Provider Notes (Signed)
Emergency Department Provider Note   I have reviewed the triage vital signs and the nursing notes.   HISTORY  Chief Complaint Abnormal Lab   HPI [name] is a 75 y.o. female with past medical history reviewed below including frequent ear infections and intermittent hyponatremia presents to the emergency department with abnormal lab work from her PCP.  Patient states that she has had 2 urinary tract infections recently and is currently on daily Macrobid.  Her last infection was early July where she was treated with Keflex.  No other new medications.  She states that several days ago she had a near syncope episode without chest pain or heart palpitations.  She felt lightheaded and fatigued very suddenly and saw spots in her vision.  No unilateral weakness or numbness.  No passing out.  She states that since that time she has felt cramping in her muscles, generalized weakness, fatigue.  She saw her primary care doctor who ran blood work and found her sodium to be low once again.  She did have an admission in 2020 with very low sodium and states that also was in the setting of urinary tract infection.  She states that after that she was placed on fluid restriction of 50 ounces of fluids per day and states that she is strictly compliant with that. Appetite is normal. No vomiting or diarrhea.   Past Medical History:  Diagnosis Date   Acute meniscal tear of knee    Anxiety    Basal cell carcinoma    endometrial  cancer   Colon polyp    Diabetes ([place])    type 2   Diverticulosis    Edema    Esophageal reflux    Fibrocystic breast    Hemorrhoids    HTN (hypertension)    Hyperlipidemia    Hyponatremia    Hypothyroidism    OA (osteoarthritis) of hip    Presence of permanent cardiac pacemaker    due to bradycardia  03-2020   Seasonal allergies    Sinus bradycardia    Syncope    vagal,  in the setting of GI cramping    Patient Active Problem List   Diagnosis Date Noted    Endometrial cancer ([place]) 10/07/2020   PMB (postmenopausal bleeding) 09/01/2020   Diabetes ([place])    Hyponatremia 10/13/2019   Dyslipidemia 10/13/2019   Bradycardia 06/03/2015   Essential hypertension, benign 07/02/2014   Elevated CPK 07/02/2014   Abnormal EKG 07/02/2014   Atypical chest pain 07/02/2014   Obesity, unspecified 07/02/2014   CONSTIPATION 08/04/2010   DERMATOPHYTOSIS OF GROIN AND PERIANAL AREA 01/01/2010   LOOSE STOOLS 01/01/2010   FULL INCONTINENCE OF FECES 01/01/2010   ABDOMINAL WALL PAIN 07/03/2009   GERD 12/05/2008   Irritable bowel syndrome 12/05/2008   CHEST PAIN 12/05/2008    Past Surgical History:  Procedure Laterality Date   CHOLECYSTECTOMY     KNEE ARTHROSCOPY     meniscal tear   right   PACEMAKER IMPLANT N/A 03/06/2020   Procedure: PACEMAKER IMPLANT;  Surgeon: [name], MD;  Location: [facility];  Service: Cardiovascular;  Laterality: N/A;   ROBOTIC ASSISTED TOTAL HYSTERECTOMY WITH BILATERAL SALPINGO OOPHERECTOMY N/A 10/07/2020   Procedure: XI ROBOTIC ASSISTED TOTAL HYSTERECTOMY WITH BILATERAL SALPINGO OOPHORECTOMY;  Surgeon: Everitt Amber, MD;  Location: [facility];  Service: Gynecology;  Laterality: N/A;   SENTINEL NODE BIOPSY N/A 10/07/2020   Procedure: SENTINEL NODE BIOPSY;  Surgeon: Everitt Amber, MD;  Location: [facility];  Service: Gynecology;  Laterality: N/A;   TONSILLECTOMY     TUBAL LIGATION      Allergies Amoxicillin, Sulfamethoxazole-trimethoprim, Ciprofloxacin, Atorvastatin, Clarithromycin, Ezetimibe, Fenofibrate, Hydrochlorothiazide, Niaspan [niacin er], Welchol [colesevelam hcl], Zoster vaccine live, and Clindamycin/lincomycin  Family History  Problem Relation Age of Onset   Colon cancer Mother    Hypertension Mother    Heart disease Mother    Coronary artery disease Mother    Lung cancer Father    Other Brother        CABG   Breast cancer Sister    Breast cancer Cousin    Breast cancer Niece    Ovarian cancer Neg Hx     Endometrial cancer Neg Hx     Social History Social History   Tobacco Use   Smoking status: Never   Smokeless tobacco: Never  Vaping Use   Vaping Use: Never used  Substance Use Topics   Alcohol use: Yes    Comment: 1-2 per year   Drug use: No    Review of Systems  Constitutional: No fever/chills. Positive generalized weakness and muscle cramping.  Eyes: No visual changes. ENT: No sore throat. Cardiovascular: Denies chest pain. Positive near syncope.  Respiratory: Denies shortness of breath. Gastrointestinal: No abdominal pain.  No nausea, no vomiting.  No diarrhea.  No constipation. Genitourinary: Negative for dysuria. Musculoskeletal: Negative for back pain. Skin: Negative for rash. Neurological: Negative for focal weakness or numbness. Positive HA.   10-point ROS otherwise negative.  ____________________________________________   PHYSICAL EXAM:  VITAL SIGNS: ED Triage Vitals  Enc Vitals Group     BP 05/23/21 1407 (!) 149/74     Pulse Rate 05/23/21 1407 69     Resp 05/23/21 1407 15     Temp 05/23/21 1407 98.5 F (36.9 C)     Temp Source 05/23/21 1407 Oral     SpO2 05/23/21 1407 97 %   Constitutional: Alert and oriented. Well appearing and in no acute distress. Eyes: Conjunctivae are normal.  Head: Atraumatic. Nose: No congestion/rhinnorhea. Mouth/Throat: Mucous membranes are moist.   Neck: No stridor.   Cardiovascular: Normal rate, regular rhythm. Good peripheral circulation. Grossly normal heart sounds.   Respiratory: Normal respiratory effort.  No retractions. Lungs CTAB. Gastrointestinal: Soft and nontender. No distention.  Musculoskeletal: No lower extremity tenderness nor edema. No gross deformities of extremities. Neurologic:  Normal speech and language. No gross focal neurologic deficits are appreciated.  Skin:  Skin is warm, dry and intact. No rash noted.  ____________________________________________   LABS (all labs ordered are listed, but  only abnormal results are displayed)  Labs Reviewed  COMPREHENSIVE METABOLIC PANEL - Abnormal; Notable for the following components:      Result Value   Sodium 126 (*)    Chloride 95 (*)    Glucose, Bld 201 (*)    Creatinine, Ser 1.04 (*)    GFR, Estimated 57 (*)    All other components within normal limits  URINALYSIS, ROUTINE W REFLEX MICROSCOPIC - Abnormal; Notable for the following components:   APPearance HAZY (*)    Ketones, ur 5 (*)    All other components within normal limits  RESP PANEL BY RT-PCR (FLU A&B, COVID) ARPGX2  CBC WITH DIFFERENTIAL/PLATELET  MAGNESIUM  TSH  BRAIN NATRIURETIC PEPTIDE  SODIUM, URINE, RANDOM  OSMOLALITY, URINE  OSMOLALITY  CBG MONITORING, ED   ____________________________________________  EKG   EKG Interpretation  Date/Time:  Saturday May 23 2021 15:24:57 EDT Ventricular Rate:  61 PR Interval:  178 QRS Duration: 98 QT Interval:  418 QTC Calculation: 420 R Axis:   -71 Text Interpretation: Atrial-paced rhythm Left axis deviation Cannot rule out Anterior infarct , age undetermined Abnormal ECG Confirmed by Nanda Quinton [phone]) on 05/24/2021 2:30:21 AM        ____________________________________________   PROCEDURES  Procedure(s) performed:   Procedures  None  ____________________________________________   INITIAL IMPRESSION / ASSESSMENT AND PLAN / ED COURSE  Pertinent labs & imaging results that were available during my care of the patient were reviewed by me and considered in my medical decision making (see chart for details).   Patient presents to the emergency department with generalized weakness, near syncope, muscle cramping.  Sodium is once again dropped to the low, today at 126.  She had a prior admission in 2020 where sodium drifted as low as 109 with similar symptoms. Plan for additional fluid restriction here as patient does not appear to be clinically dehydrated.   Discussed patient's case with [facility] to request  admission. Patient and family (if present) updated with plan. Care transferred to [facility] service.  I reviewed all nursing notes, vitals, pertinent old records, EKGs, labs, imaging (as available).  ____________________________________________  FINAL CLINICAL IMPRESSION(S) / ED DIAGNOSES  Final diagnoses:  Hyponatremia  Generalized weakness     Note:  This document was prepared using Dragon voice recognition software and may include unintentional dictation errors.  Nanda Quinton, MD, [facility] Emergency Medicine    Siris Hoos, [facility], MD 05/24/21 [id number]

## 2021-05-24 NOTE — ED Notes (Signed)
CBG= 121

## 2021-05-25 DIAGNOSIS — Z7982 Long term (current) use of aspirin: Secondary | ICD-10-CM | POA: Diagnosis not present

## 2021-05-25 DIAGNOSIS — E785 Hyperlipidemia, unspecified: Secondary | ICD-10-CM | POA: Diagnosis present

## 2021-05-25 DIAGNOSIS — N179 Acute kidney failure, unspecified: Secondary | ICD-10-CM

## 2021-05-25 DIAGNOSIS — R531 Weakness: Secondary | ICD-10-CM | POA: Diagnosis not present

## 2021-05-25 DIAGNOSIS — Z801 Family history of malignant neoplasm of trachea, bronchus and lung: Secondary | ICD-10-CM | POA: Diagnosis not present

## 2021-05-25 DIAGNOSIS — Z800 Family history of malignant neoplasm of digestive organs: Secondary | ICD-10-CM | POA: Diagnosis not present

## 2021-05-25 DIAGNOSIS — Z79899 Other long term (current) drug therapy: Secondary | ICD-10-CM | POA: Diagnosis not present

## 2021-05-25 DIAGNOSIS — Z8249 Family history of ischemic heart disease and other diseases of the circulatory system: Secondary | ICD-10-CM | POA: Diagnosis not present

## 2021-05-25 DIAGNOSIS — I493 Ventricular premature depolarization: Secondary | ICD-10-CM | POA: Diagnosis present

## 2021-05-25 DIAGNOSIS — Z79890 Hormone replacement therapy (postmenopausal): Secondary | ICD-10-CM | POA: Diagnosis not present

## 2021-05-25 DIAGNOSIS — Z90710 Acquired absence of both cervix and uterus: Secondary | ICD-10-CM | POA: Diagnosis not present

## 2021-05-25 DIAGNOSIS — Z8542 Personal history of malignant neoplasm of other parts of uterus: Secondary | ICD-10-CM | POA: Diagnosis not present

## 2021-05-25 DIAGNOSIS — E119 Type 2 diabetes mellitus without complications: Secondary | ICD-10-CM | POA: Diagnosis present

## 2021-05-25 DIAGNOSIS — E1129 Type 2 diabetes mellitus with other diabetic kidney complication: Secondary | ICD-10-CM | POA: Diagnosis not present

## 2021-05-25 DIAGNOSIS — Z803 Family history of malignant neoplasm of breast: Secondary | ICD-10-CM | POA: Diagnosis not present

## 2021-05-25 DIAGNOSIS — E1165 Type 2 diabetes mellitus with hyperglycemia: Secondary | ICD-10-CM | POA: Diagnosis not present

## 2021-05-25 DIAGNOSIS — I10 Essential (primary) hypertension: Secondary | ICD-10-CM | POA: Diagnosis present

## 2021-05-25 DIAGNOSIS — Z85828 Personal history of other malignant neoplasm of skin: Secondary | ICD-10-CM | POA: Diagnosis not present

## 2021-05-25 DIAGNOSIS — K2970 Gastritis, unspecified, without bleeding: Secondary | ICD-10-CM | POA: Diagnosis present

## 2021-05-25 DIAGNOSIS — K219 Gastro-esophageal reflux disease without esophagitis: Secondary | ICD-10-CM | POA: Diagnosis present

## 2021-05-25 DIAGNOSIS — Z87440 Personal history of urinary (tract) infections: Secondary | ICD-10-CM | POA: Diagnosis not present

## 2021-05-25 DIAGNOSIS — Z20822 Contact with and (suspected) exposure to covid-19: Secondary | ICD-10-CM | POA: Diagnosis present

## 2021-05-25 DIAGNOSIS — E871 Hypo-osmolality and hyponatremia: Secondary | ICD-10-CM | POA: Diagnosis not present

## 2021-05-25 DIAGNOSIS — Z950 Presence of cardiac pacemaker: Secondary | ICD-10-CM | POA: Diagnosis not present

## 2021-05-25 DIAGNOSIS — E039 Hypothyroidism, unspecified: Secondary | ICD-10-CM | POA: Diagnosis present

## 2021-05-25 DIAGNOSIS — E222 Syndrome of inappropriate secretion of antidiuretic hormone: Secondary | ICD-10-CM | POA: Diagnosis present

## 2021-05-25 LAB — GLUCOSE, CAPILLARY
Glucose-Capillary: 118 mg/dL — ABNORMAL HIGH (ref 70–99)
Glucose-Capillary: 119 mg/dL — ABNORMAL HIGH (ref 70–99)
Glucose-Capillary: 130 mg/dL — ABNORMAL HIGH (ref 70–99)
Glucose-Capillary: 135 mg/dL — ABNORMAL HIGH (ref 70–99)

## 2021-05-25 LAB — BASIC METABOLIC PANEL
Anion gap: 7 (ref 5–15)
Anion gap: 10 (ref 5–15)
BUN: 18 mg/dL (ref 8–23)
BUN: 21 mg/dL (ref 8–23)
CO2: 23 mmol/L (ref 22–32)
CO2: 22 mmol/L (ref 22–32)
Calcium: 8.8 mg/dL — ABNORMAL LOW (ref 8.9–10.3)
Calcium: 9.2 mg/dL (ref 8.9–10.3)
Chloride: 96 mmol/L — ABNORMAL LOW (ref 98–111)
Chloride: 96 mmol/L — ABNORMAL LOW (ref 98–111)
Creatinine, Ser: 0.96 mg/dL (ref 0.44–1.00)
Creatinine, Ser: 1.27 mg/dL — ABNORMAL HIGH (ref 0.44–1.00)
GFR, Estimated: >60 mL/min (ref >60)
GFR, Estimated: 45 mL/min — ABNORMAL LOW (ref >60)
Glucose, Bld: 119 mg/dL — ABNORMAL HIGH (ref 70–99)
Glucose, Bld: 161 mg/dL — ABNORMAL HIGH (ref 70–99)
Potassium: 4.2 mmol/L (ref 3.5–5.1)
Potassium: 4.3 mmol/L (ref 3.5–5.1)
Sodium: 126 mmol/L — ABNORMAL LOW (ref 135–145)
Sodium: 128 mmol/L — ABNORMAL LOW (ref 135–145)

## 2021-05-25 LAB — SODIUM: Sodium: 128 mmol/L — ABNORMAL LOW (ref 135–145)

## 2021-05-25 LAB — HEMOGLOBIN A1C
Hgb A1c MFr Bld: 7.7 % — ABNORMAL HIGH (ref 4.8–5.6)
Mean Plasma Glucose: 174 mg/dL

## 2021-05-25 LAB — CREATININE, URINE, RANDOM: Creatinine, Urine: 77.63 mg/dL

## 2021-05-25 LAB — SODIUM, URINE, RANDOM: Sodium, Ur: 74 mmol/L

## 2021-05-25 LAB — OSMOLALITY, URINE: Osmolality, Ur: 567 mOsm/kg (ref 300–900)

## 2021-05-25 MED ORDER — FUROSEMIDE 40 MG PO TABS
40.0000 mg | ORAL_TABLET | Freq: Every day | ORAL | Status: DC
  Administered 2021-05-25 – 2021-05-27 (×3): 40 mg via ORAL
  Filled 2021-05-25 (×3): qty 1

## 2021-05-25 MED ORDER — SODIUM CHLORIDE 1 G PO TABS
1.0000 g | ORAL_TABLET | Freq: Three times a day (TID) | ORAL | Status: DC
  Administered 2021-05-25 (×2): 1 g via ORAL
  Filled 2021-05-25 (×4): qty 1

## 2021-05-25 NOTE — Consult Note (Addendum)
[facility] Renal Consultation Note  Requesting MD: Dr. Lonny Prude Indication for Consultation: Hyponatremia  HPI:  [name] is a 75 y.o. female with PMH of endometrial carcinoma s/p surgery being followed by GYN, HTN , and T2DM who was send to the hospital by her PCP due to low sodium of 127.  Patient reports she has a history of low sodiums dating back to 20 years ago.  She was hospitalized 2 years ago for severely low sodium of 109.  Her sodium levels has fluctuated between 127-135 since then. Patient reports she had total hysterectomy in December 2021 and since then she has had multiple UTIs.  She completed antibiotics for her last UTI a few weeks ago.  Last week, she started experiencing headaches and muscle cramps.  She saw her PCP in as to have her sodium rechecked and it came back low at 127.  Her PCP sent her to the hospital for further evaluation.   On admission, patient's sodium level was 126. She was admitted to the hospitalist service and started on fluid restriction. Sodium levels eventually increased to 130 then trended back down to 126. Patient was started on salt tablets this morning. Sodium levels now trended back up to 128. Nephrology consulted to assist with management of patient's hyponatremia. Patient reports ongoing headache and fatigue. She had some nausea yesterday but that has now resolved.  Denies any confusion, blurry vision, or dysuria.   Creatinine, Ser  Date/Time Value Ref Range Status  05/25/2021 01:44 PM 1.27 (H) 0.44 - 1.00 mg/dL Final  05/25/2021 06:02 AM 0.96 0.44 - 1.00 mg/dL Final  05/24/2021 04:36 PM 0.96 0.44 - 1.00 mg/dL Final  05/24/2021 06:30 AM 0.87 0.44 - 1.00 mg/dL Final  05/23/2021 03:06 PM 1.04 (H) 0.44 - 1.00 mg/dL Final  09/23/2020 09:56 AM 0.62 0.44 - 1.00 mg/dL Final  07/21/2020 03:21 PM 1.02 (H) 0.44 - 1.00 mg/dL Final  02/21/2020 08:54 AM 0.85 0.57 - 1.00 mg/dL Final  02/05/2020 03:59 PM 0.83 0.57 - 1.00 mg/dL Final   10/18/2019 04:55 AM 0.87 0.44 - 1.00 mg/dL Final  10/17/2019 04:35 PM 0.96 0.44 - 1.00 mg/dL Final  10/17/2019 04:17 AM 0.97 0.44 - 1.00 mg/dL Final  10/16/2019 10:18 PM 0.90 0.44 - 1.00 mg/dL Final  10/16/2019 05:10 PM 0.95 0.44 - 1.00 mg/dL Final  10/16/2019 11:59 AM 0.95 0.44 - 1.00 mg/dL Final  10/16/2019 03:17 AM 0.76 0.44 - 1.00 mg/dL Final  10/15/2019 09:54 PM 0.75 0.44 - 1.00 mg/dL Final  10/15/2019 02:16 PM 1.18 (H) 0.44 - 1.00 mg/dL Final  10/15/2019 09:39 AM 0.84 0.44 - 1.00 mg/dL Final  10/15/2019 04:00 AM 0.54 0.44 - 1.00 mg/dL Final  10/14/2019 09:05 PM 0.82 0.44 - 1.00 mg/dL Final  10/14/2019 03:47 PM 0.94 0.44 - 1.00 mg/dL Final  10/14/2019 09:53 AM 0.70 0.44 - 1.00 mg/dL Final  10/14/2019 02:52 AM 0.75 0.44 - 1.00 mg/dL Final  10/13/2019 09:27 PM 0.84 0.44 - 1.00 mg/dL Final  10/13/2019 05:04 PM 0.95 0.44 - 1.00 mg/dL Final  12/06/2018 08:57 AM 0.87 0.57 - 1.00 mg/dL Final  09/21/2007 01:50 PM 0.97  Final     PMHx:   Past Medical History:  Diagnosis Date   Acute meniscal tear of knee    Anxiety    Basal cell carcinoma    endometrial  cancer   Colon polyp    Diabetes ([place])    type 2   Diverticulosis    Edema    Esophageal reflux  Fibrocystic breast    Hemorrhoids    HTN (hypertension)    Hyperlipidemia    Hyponatremia    Hypothyroidism    OA (osteoarthritis) of hip    Presence of permanent cardiac pacemaker    due to bradycardia  03-2020   Seasonal allergies    Sinus bradycardia    Syncope    vagal,  in the setting of GI cramping    Past Surgical History:  Procedure Laterality Date   CHOLECYSTECTOMY     KNEE ARTHROSCOPY     meniscal tear   right   PACEMAKER IMPLANT N/A 03/06/2020   Procedure: PACEMAKER IMPLANT;  Surgeon: [name], MD;  Location: [facility];  Service: Cardiovascular;  Laterality: N/A;   ROBOTIC ASSISTED TOTAL HYSTERECTOMY WITH BILATERAL SALPINGO OOPHERECTOMY N/A 10/07/2020   Procedure: XI ROBOTIC ASSISTED TOTAL  HYSTERECTOMY WITH BILATERAL SALPINGO OOPHORECTOMY;  Surgeon: Everitt Amber, MD;  Location: [facility];  Service: Gynecology;  Laterality: N/A;   SENTINEL NODE BIOPSY N/A 10/07/2020   Procedure: SENTINEL NODE BIOPSY;  Surgeon: Everitt Amber, MD;  Location: [facility];  Service: Gynecology;  Laterality: N/A;   TONSILLECTOMY     TUBAL LIGATION      Family Hx:  Family History  Problem Relation Age of Onset   Colon cancer Mother    Hypertension Mother    Heart disease Mother    Coronary artery disease Mother    Lung cancer Father    Other Brother        CABG   Breast cancer Sister    Breast cancer Cousin    Breast cancer Niece    Ovarian cancer Neg Hx    Endometrial cancer Neg Hx     Social History:  reports that she has never smoked. She has never used smokeless tobacco. She reports current alcohol use. She reports that she does not use drugs.  Allergies:  Allergies  Allergen Reactions   Amoxicillin Rash    Other reaction(s): rash   Sulfamethoxazole-Trimethoprim Other (See Comments)    Decreased sodium significantly  Other reaction(s): low sodium   Ciprofloxacin Other (See Comments)    Muscle aches/pains   Atorvastatin Other (See Comments)    CPK Other reaction(s): increase CPK   Clarithromycin Nausea And Vomiting    Other reaction(s): vomiting   Ezetimibe Other (See Comments)    CPK  Other reaction(s): increase CPK   Fenofibrate Other (See Comments)    CPK Other reaction(s): increase CPK   Hydrochlorothiazide Other (See Comments)    Decreased Potassium and decreased Sodium Other reaction(s): hyponatremia, hypokalemia   Niaspan [Niacin Er] Other (See Comments)    CPK   Welchol [Colesevelam Hcl] Other (See Comments)    CPK   Zoster Vaccine Live     Doesn't recall the reaction Other reaction(s): unknown   Clindamycin/Lincomycin Rash   Keflex [Cephalexin] Rash    Medications: Prior to Admission medications   Medication Sig Start Date End Date Taking? Authorizing Provider   amLODipine (NORVASC) 5 MG tablet Take 5 mg by mouth daily.   Yes [provider]  aspirin 81 MG tablet Take 81 mg by mouth daily.    Yes [provider]  Bioflavonoid Products (VITAMIN C) CHEW Chew 3 tablets by mouth daily.    Yes [provider]  Cholecalciferol (VITAMIN D) 125 MCG (5000 UT) CAPS Take 5,000 Units by mouth daily.    Yes [provider]  Cranberry 500 MG CHEW Chew 1,500 mg by mouth daily.  Yes [provider]  esomeprazole (NEXIUM) 40 MG capsule Take 40 mg by mouth 2 (two) times daily before a meal.  11/08/18  Yes [provider]  estradiol (ESTRACE VAGINAL) 0.1 MG/GM vaginal cream Place 1 Applicatorful vaginally 3 (three) times a week. 04/22/21  Yes Everitt Amber, MD  fluticasone [employer]) 50 MCG/ACT nasal spray Place 2 sprays into both nostrils daily as needed for allergies or rhinitis.    Yes [provider]  levothyroxine (SYNTHROID, LEVOTHROID) 50 MCG tablet Take 50 mcg by mouth daily before breakfast.   Yes [provider]  losartan (COZAAR) 50 MG tablet Take 50 mg by mouth in the morning and at bedtime.    Yes [provider]  nitrofurantoin, macrocrystal-monohydrate, (MACROBID) 100 MG capsule Take 100 mg by mouth daily. 04/23/21  Yes [provider]  Polyethyl Glyc-Propyl Glyc PF (SYSTANE HYDRATION PF) 0.4-0.3 % SOLN Place 1 drop into both eyes 2 (two) times daily as needed (dry eyes).   Yes [provider]  Probiotic Product (PROBIOTIC DAILY PO) Take 1 capsule by mouth daily.   Yes [provider]  psyllium (METAMUCIL) 58.6 % packet Take 1 packet by mouth at bedtime.   Yes [provider]  rosuvastatin (CRESTOR) 5 MG tablet Take 5 mg by mouth daily.   Yes [provider]    I have reviewed the patient's current medications.  Labs:  Results for orders placed or performed during the hospital encounter of 05/23/21 (from the past 48 hour(s))  Urinalysis,  Routine w reflex microscopic Urine, Clean Catch     Status: Abnormal   Collection Time: 05/23/21  9:00 PM  Result Value Ref Range   Color, Urine YELLOW YELLOW   APPearance HAZY (A) CLEAR   Specific Gravity, Urine 1.021 1.005 - 1.030   pH 5.0 5.0 - 8.0   Glucose, UA NEGATIVE NEGATIVE mg/dL   Hgb urine dipstick NEGATIVE NEGATIVE   Bilirubin Urine NEGATIVE NEGATIVE   Ketones, ur 5 (A) NEGATIVE mg/dL   Protein, ur NEGATIVE NEGATIVE mg/dL   Nitrite NEGATIVE NEGATIVE   Leukocytes,Ua NEGATIVE NEGATIVE    Comment: Performed at [facility] [address], [place], [place] [address]  Sodium, urine, random     Status: None   Collection Time: 05/23/21  9:00 PM  Result Value Ref Range   Sodium, Ur 53 mmol/L    Comment: Performed at [facility] [address], [place], [place] [address]  Osmolality, urine     Status: None   Collection Time: 05/23/21  9:00 PM  Result Value Ref Range   Osmolality, Ur 740 300 - 900 mOsm/kg    Comment: Performed at [facility] [address] [place], [place] [address]  Resp Panel by RT-PCR (Flu A&B, Covid) Nasopharyngeal Swab     Status: None   Collection Time: 05/24/21  3:15 AM   Specimen: Nasopharyngeal Swab; Nasopharyngeal(NP) swabs in vial transport medium  Result Value Ref Range   SARS Coronavirus 2 by RT PCR NEGATIVE NEGATIVE    Comment: (NOTE) SARS-CoV-2 target nucleic acids are NOT DETECTED.  The SARS-CoV-2 RNA is generally detectable in upper respiratory specimens during the acute phase of infection. The lowest concentration of SARS-CoV-2 viral copies this assay can detect is 138 copies/mL. A negative result does not preclude SARS-Cov-2 infection and should not be used as the sole basis for treatment or other patient management decisions. A negative result may occur with  improper specimen collection/handling, submission  of specimen other than nasopharyngeal swab, presence of viral mutation(s) within the areas targeted by this  assay, and inadequate number of viral copies(<138 copies/mL). A negative result must be combined with clinical observations, patient history, and epidemiological information. The expected result is Negative.  Fact Sheet for Patients:  EntrepreneurPulse.com.au  Fact Sheet for Healthcare Providers:  IncredibleEmployment.be  This test is no t yet approved or cleared by the [place] FDA and  has been authorized for detection and/or diagnosis of SARS-CoV-2 by FDA under an Emergency Use Authorization (EUA). This EUA will remain  in effect (meaning this test can be used) for the duration of the COVID-19 declaration under Section 564(b)(1) of the Act, 21 U.S.C.section 360bbb-3(b)(1), unless the authorization is terminated  or revoked sooner.       Influenza A by PCR NEGATIVE NEGATIVE   Influenza B by PCR NEGATIVE NEGATIVE    Comment: (NOTE) The Xpert Xpress SARS-CoV-2/FLU/RSV plus assay is intended as an aid in the diagnosis of influenza from Nasopharyngeal swab specimens and should not be used as a sole basis for treatment. Nasal washings and aspirates are unacceptable for Xpert Xpress SARS-CoV-2/FLU/RSV testing.  Fact Sheet for Patients: EntrepreneurPulse.com.au  Fact Sheet for Healthcare Providers: IncredibleEmployment.be  This test is not yet approved or cleared by the [place] FDA and has been authorized for detection and/or diagnosis of SARS-CoV-2 by FDA under an Emergency Use Authorization (EUA). This EUA will remain in effect (meaning this test can be used) for the duration of the COVID-19 declaration under Section 564(b)(1) of the Act, 21 U.S.C. section 360bbb-3(b)(1), unless the authorization is terminated or revoked.  Performed at [facility], [place] [address], [place], [place] [address]   Osmolality     Status: None   Collection Time: 05/24/21  6:00 AM  Result Value Ref Range    Osmolality 275 275 - 295 mOsm/kg    Comment: Performed at [facility] [address], [place], [place] [address]  TSH     Status: None   Collection Time: 05/24/21  6:03 AM  Result Value Ref Range   TSH 3.509 0.350 - 4.500 uIU/mL    Comment: Performed by a 3rd Generation assay with a functional sensitivity of <=0.01 uIU/mL. Performed at [facility], [place] [address], [place],  [address]   Hemoglobin A1c     Status: Abnormal   Collection Time: 05/24/21  6:30 AM  Result Value Ref Range   Hgb A1c MFr Bld 7.7 (H) 4.8 - 5.6 %    Comment: (NOTE)         Prediabetes: 5.7 - 6.4         Diabetes: >6.4         Glycemic control for adults with diabetes: <7.0    Mean Plasma Glucose 174 mg/dL    Comment: (NOTE) Performed At: [facility] [place], [place] [id number] Rush Farmer MD [id number]   CBC     Status: None   Collection Time: 05/24/21  6:30 AM  Result Value Ref Range   WBC 7.6 4.0 - 10.5 K/uL   RBC 4.86 3.87 - 5.11 MIL/uL   Hemoglobin 14.4 12.0 - 15.0 g/dL   HCT 41.8 36.0 - 46.0 %   MCV 86.0 80.0 - 100.0 fL   MCH 29.6 26.0 - 34.0 pg   MCHC 34.4 30.0 - 36.0 g/dL   RDW 13.2 11.5 - 15.5 %   Platelets 217 150 - 400 K/uL  nRBC 0.0 0.0 - 0.2 %    Comment: Performed at [facility], [place] [address] [place], [place] [id number]  Basic metabolic panel     Status: Abnormal   Collection Time: 05/24/21  6:30 AM  Result Value Ref Range   Sodium 130 (L) 135 - 145 mmol/L   Potassium 4.0 3.5 - 5.1 mmol/L   Chloride 95 (L) 98 - 111 mmol/L   CO2 24 22 - 32 mmol/L   Glucose, Bld 124 (H) 70 - 99 mg/dL    Comment: Glucose reference range applies only to samples taken after fasting for at least 8 hours.   BUN 17 8 - 23 mg/dL   Creatinine, Ser 0.87 0.44 - 1.00 mg/dL   Calcium 9.2 8.9 - 10.3 mg/dL   GFR, Estimated >60 >60 mL/min    Comment: (NOTE) Calculated using the CKD-EPI Creatinine Equation (2021)    Anion gap 11 5 - 15    Comment:  Performed at [facility] [address], [place], [place] [address]  Uric acid     Status: None   Collection Time: 05/24/21  6:30 AM  Result Value Ref Range   Uric Acid, Serum 3.4 2.5 - 7.1 mg/dL    Comment: Performed at [facility] [address] [place], [place] [address]  Cortisol     Status: None   Collection Time: 05/24/21  6:30 AM  Result Value Ref Range   Cortisol, Plasma 12.1 ug/dL    Comment: (NOTE) AM    6.7 - 22.6 ug/dL PM   <10.0       ug/dL Performed at [facility] [address], [place], [place] [address]   POC CBG, ED     Status: Abnormal   Collection Time: 05/24/21  8:18 AM  Result Value Ref Range   Glucose-Capillary 121 (H) 70 - 99 mg/dL    Comment: Glucose reference range applies only to samples taken after fasting for at least 8 hours.  Na and K (sodium & potassium), rand urine     Status: None   Collection Time: 05/24/21  9:50 AM  Result Value Ref Range   Sodium, Ur 109 mmol/L   Potassium Urine 32 mmol/L    Comment: Performed at [facility] [address], [place], [place] [address]  CBG monitoring, ED     Status: Abnormal   Collection Time: 05/24/21 12:20 PM  Result Value Ref Range   Glucose-Capillary 167 (H) 70 - 99 mg/dL    Comment: Glucose reference range applies only to samples taken after fasting for at least 8 hours.  Glucose, capillary     Status: Abnormal   Collection Time: 05/24/21  2:35 PM  Result Value Ref Range   Glucose-Capillary 154 (H) 70 - 99 mg/dL    Comment: Glucose reference range applies only to samples taken after fasting for at least 8 hours.  Basic metabolic panel     Status: Abnormal   Collection Time: 05/24/21  4:36 PM  Result Value Ref Range   Sodium 126 (L) 135 - 145 mmol/L   Potassium 4.5 3.5 - 5.1 mmol/L   Chloride 95 (L) 98 - 111 mmol/L   CO2 23 22 - 32 mmol/L   Glucose, Bld 133 (H) 70 - 99 mg/dL    Comment: Glucose reference range applies only to samples taken after fasting for at least  8 hours.   BUN 20 8 - 23 mg/dL   Creatinine,  Ser 0.96 0.44 - 1.00 mg/dL   Calcium 9.5 8.9 - 10.3 mg/dL   GFR, Estimated >60 >60 mL/min    Comment: (NOTE) Calculated using the CKD-EPI Creatinine Equation (2021)    Anion gap 8 5 - 15    Comment: Performed at [facility] [address] [place], [place] [address]  Creatinine, urine, random     Status: None   Collection Time: 05/24/21  5:43 PM  Result Value Ref Range   Creatinine, Urine 77.63 mg/dL    Comment: Performed at [facility] [address], [place], [place] [address]  Osmolality, urine     Status: None   Collection Time: 05/24/21  5:43 PM  Result Value Ref Range   Osmolality, Ur 567 300 - 900 mOsm/kg    Comment: Performed at [facility] [address] [place], [place] [address]  Sodium, urine, random     Status: None   Collection Time: 05/24/21  5:43 PM  Result Value Ref Range   Sodium, Ur 74 mmol/L    Comment: Performed at [facility] [address], [place], [place] [address]  Glucose, capillary     Status: Abnormal   Collection Time: 05/24/21  9:39 PM  Result Value Ref Range   Glucose-Capillary 186 (H) 70 - 99 mg/dL    Comment: Glucose reference range applies only to samples taken after fasting for at least 8 hours.  Basic metabolic panel     Status: Abnormal   Collection Time: 05/25/21  6:02 AM  Result Value Ref Range   Sodium 126 (L) 135 - 145 mmol/L   Potassium 4.2 3.5 - 5.1 mmol/L   Chloride 96 (L) 98 - 111 mmol/L   CO2 23 22 - 32 mmol/L   Glucose, Bld 119 (H) 70 - 99 mg/dL    Comment: Glucose reference range applies only to samples taken after fasting for at least 8 hours.   BUN 18 8 - 23 mg/dL   Creatinine, Ser 0.96 0.44 - 1.00 mg/dL   Calcium 8.8 (L) 8.9 - 10.3 mg/dL   GFR, Estimated >60 >60 mL/min    Comment: (NOTE) Calculated using the CKD-EPI Creatinine Equation (2021)    Anion gap 7 5 - 15    Comment: Performed at [facility] [address], [place],  [place] [address]  Glucose, capillary     Status: Abnormal   Collection Time: 05/25/21  7:04 AM  Result Value Ref Range   Glucose-Capillary 118 (H) 70 - 99 mg/dL    Comment: Glucose reference range applies only to samples taken after fasting for at least 8 hours.  Glucose, capillary     Status: Abnormal   Collection Time: 05/25/21 11:49 AM  Result Value Ref Range   Glucose-Capillary 119 (H) 70 - 99 mg/dL    Comment: Glucose reference range applies only to samples taken after fasting for at least 8 hours.  Basic metabolic panel     Status: Abnormal   Collection Time: 05/25/21  1:44 PM  Result Value Ref Range   Sodium 128 (L) 135 - 145 mmol/L   Potassium 4.3 3.5 - 5.1 mmol/L   Chloride 96 (L) 98 - 111 mmol/L   CO2 22 22 - 32 mmol/L   Glucose, Bld 161 (H) 70 - 99 mg/dL    Comment: Glucose reference range applies only to samples taken after fasting for at least 8 hours.   BUN 21 8 - 23 mg/dL  Creatinine, Ser 1.27 (H) 0.44 - 1.00 mg/dL   Calcium 9.2 8.9 - 10.3 mg/dL   GFR, Estimated 45 (L) >60 mL/min    Comment: (NOTE) Calculated using the CKD-EPI Creatinine Equation (2021)    Anion gap 10 5 - 15    Comment: Performed at [facility] [address] [place], [place] [address]     ROS:  Pertinent items are noted in HPI.  Physical Exam: Vitals:   05/25/21 1100 05/25/21 1147  BP:  133/64  Pulse:  60  Resp:  18  Temp:  97.8 F (36.6 C)  SpO2: 97% 98%     General: Pleasant, well-appearing elderly woman laying in bed. No acute distress. Head: Normocephalic. Atraumatic. CV: RRR. No murmurs, rubs, or gallops.  Trace BLE pitting edema Pulmonary: Lungs CTAB. Normal effort. No wheezing or rales. Abdominal: Soft, nontender, nondistended. Normal bowel sounds. Extremities: Palpable radial and DP pulses. Normal ROM. Skin: Warm and dry. No obvious rash or lesions. Neuro: A&Ox3. Moves all extremities. Normal sensation. No focal deficit. Psych: Normal mood and  affect   Assessment/Plan: Hyponatremia: Patient with a history of chronic SIADH found to have acute drop in sodium to 126 likely secondary to recent UTI. Fluid restriction initiated on admission. Sodium initially improved to 130 but trended down to 126 now up to 128.  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 She continues to have mild symptoms. She is euvolemic on exam but has some trace bilateral lower extremity edema. We will try a dose of loop diuretic and monitor closely. --Lasix 40 mg daily --Discontinue salt tablets --Continue fluid restriction diet(<800 cc) --High-protein and salt diet --Trend BMP every 12 hours --Treat headache with Tylenol or headache cocktail if no improvement. 2.  Hypertension: Presented with elevated BP. SBP now in the 110s to 130s.  Will need to watch BP closely in the setting of fluid restriction and diuresis.  -- Continue amlodipine 5 mg  -- Start Lasix 40 mg daily 3. T2DM: CBGs in the 110s to 180s.  Management per primary team 4.  Hypothyroidism: Continue Synthroid 50 mcg daily 5.  History of endometrial cancer status post total hysterectomy in December 2021  Lacinda Axon 05/25/2021, 4:02 PM   I have seen and examined this patient and agree with the plan of care. Would like to stop the salt tablets and start Lasix to decrease concentration gradient in the loop; still may need to restart the salt tablets but she didn't require salt tablets before the recent UTI + treatment.   Other factors causing worsening of the baseline hyponatremia headaches, nausea +/- prerenal state.  Dwana Melena, MD 05/25/2021, 5:22 PM

## 2021-05-25 NOTE — Care Management Obs Status (Signed)
[facility] NOTIFICATION   Patient Details  Name: GRACIELLA CREMER MRN: [id number] Date of Birth: May 07, 1946   Medicare Observation Status Notification Given:  Yes    Bartholomew Crews, RN 05/25/2021, 11:49 AM

## 2021-05-25 NOTE — Progress Notes (Signed)
PT Cancellation Note  Patient Details Name: [name] MRN: [id number] DOB: 11-May-1946   Cancelled Treatment:    Reason Eval/Treat Not Completed: PT screened, no needs identified, will sign off per OT, patient independent with all mobility tasks and able to ambulate independently in hallway without concerns. Signing off, thank you for the opportunity to participate in her care!   Windell Norfolk, DPT, PN1   Supplemental Physical Therapist [facility]    Pager [phone] Acute Rehab Office [phone]

## 2021-05-25 NOTE — Progress Notes (Signed)
PROGRESS NOTE    [name]  [id number] DOB: 1946/02/01 DOA: 05/23/2021 PCP: Shirline Frees, MD   Brief Narrative: [name] is a 75 y.o. female with a history of endometrial cancer s/p hysterectomy, hypertension, diabetes mellitus type 2. Patient presented secondary to abnormal serum sodium of 127 and found to have a sodium of 126. Associated cramping in bilateral thighs.    Assessment & Plan:   Principal Problem:   Hyponatremia Active Problems:   Essential hypertension, benign   Diabetes ([place])   Hyponatremia Urine studies suggest SIADH picture. Normal TSH and cortisol. Corrected sodium for glucose of 128. Some initial improvement of sodium but now worsening with associated AKI. Started salt tablets -Continue fluid restricted diet (< 800 mL) -Nephrology consult  AKI -Nephrology as mentioned above  Primary hypertension -Continue amlodipine  Diabetes mellitus, type 2 -Continue SSI  Hypothyroidism -Continue Synthroid  GERD -Continue Protonix  History of endometrial cancer No known metastatic disease. Patient follows with gynecology oncology and is s/p total hysterectomy with bilateral salpingo oophorectomy   DVT prophylaxis: Lovenox Code Status:   Code Status: Full Code Family Communication: Husband at bedside Disposition Plan: Discharge pending improvement of hyponatremia/AKI   Consultants:  Nephrology  Procedures:  None  Antimicrobials: None    Subjective: Some leg cramping this morning. Some epigastric pain last night that is related to her known gastritis. Patient follows with [facility] and was meant to have an EGD back in December.  Objective: Vitals:   05/25/21 0455 05/25/21 0807 05/25/21 1100 05/25/21 1147  BP: 119/65 (!) 107/58  133/64  Pulse: 61 60  60  Resp: '20 18  18  '$ Temp: 98.8 F (37.1 C) 98.4 F (36.9 C)  97.8 F (36.6 C)  TempSrc: Oral Oral  Oral  SpO2: 98% 94% 97% 98%  Weight: 82.7 kg     Height:         Intake/Output Summary (Last 24 hours) at 05/25/2021 1628 Last data filed at 05/25/2021 1248 Gross per 24 hour  Intake 780 ml  Output 700 ml  Net 80 ml   Filed Weights   05/24/21 1432 05/25/21 0400 05/25/21 0455  Weight: 80.2 kg 82.7 kg 82.7 kg    Examination:  General exam: Appears calm and comfortable Respiratory system: Clear to auscultation. Respiratory effort normal. Cardiovascular system: S1 & S2 heard, RRR. No murmurs, rubs, gallops or clicks. Gastrointestinal system: Abdomen is nondistended, soft and nontender. No organomegaly or masses felt. Normal bowel sounds heard. Central nervous system: Alert and oriented. No focal neurological deficits. Musculoskeletal: No calf tenderness Skin: No cyanosis. No rashes Psychiatry: Judgement and insight appear normal. Mood & affect appropriate.     Data Reviewed: I have personally reviewed following labs and imaging studies  CBC Lab Results  Component Value Date   WBC 7.6 05/24/2021   RBC 4.86 05/24/2021   HGB 14.4 05/24/2021   HCT 41.8 05/24/2021   MCV 86.0 05/24/2021   MCH 29.6 05/24/2021   PLT 217 05/24/2021   MCHC 34.4 05/24/2021   RDW 13.2 05/24/2021   LYMPHSABS 1.7 05/23/2021   MONOABS 0.6 05/23/2021   EOSABS 0.2 05/23/2021   BASOSABS 0.0 [id number]     Last metabolic panel Lab Results  Component Value Date   NA 128 (L) 05/25/2021   K 4.3 05/25/2021   CL 96 (L) 05/25/2021   CO2 22 05/25/2021   BUN 21 05/25/2021   CREATININE 1.27 (H) 05/25/2021   GLUCOSE 161 (H) 05/25/2021   GFRNONAA  45 (L) 05/25/2021   GFRAA >60 07/21/2020   CALCIUM 9.2 05/25/2021   PHOS 3.2 10/16/2019   PROT 6.6 05/23/2021   ALBUMIN 4.1 05/23/2021   BILITOT 0.6 05/23/2021   ALKPHOS 76 05/23/2021   AST 18 05/23/2021   ALT 16 05/23/2021   ANIONGAP 10 05/25/2021    CBG (last 3)  Recent Labs    05/24/21 2139 05/25/21 0704 05/25/21 1149  GLUCAP 186* 118* 119*     GFR: Estimated Creatinine Clearance: 41 mL/min (A) (by C-G  formula based on SCr of 1.27 mg/dL (H)).  Coagulation Profile: No results for input(s): INR, PROTIME in the last 168 hours.  Recent Results (from the past 240 hour(s))  Resp Panel by RT-PCR (Flu A&B, Covid) Nasopharyngeal Swab     Status: None   Collection Time: 05/24/21  3:15 AM   Specimen: Nasopharyngeal Swab; Nasopharyngeal(NP) swabs in vial transport medium  Result Value Ref Range Status   SARS Coronavirus 2 by RT PCR NEGATIVE NEGATIVE Final    Comment: (NOTE) SARS-CoV-2 target nucleic acids are NOT DETECTED.  The SARS-CoV-2 RNA is generally detectable in upper respiratory specimens during the acute phase of infection. The lowest concentration of SARS-CoV-2 viral copies this assay can detect is 138 copies/mL. A negative result does not preclude SARS-Cov-2 infection and should not be used as the sole basis for treatment or other patient management decisions. A negative result may occur with  improper specimen collection/handling, submission of specimen other than nasopharyngeal swab, presence of viral mutation(s) within the areas targeted by this assay, and inadequate number of viral copies(<138 copies/mL). A negative result must be combined with clinical observations, patient history, and epidemiological information. The expected result is Negative.  Fact Sheet for Patients:  EntrepreneurPulse.com.au  Fact Sheet for Healthcare Providers:  IncredibleEmployment.be  This test is no t yet approved or cleared by the [place] FDA and  has been authorized for detection and/or diagnosis of SARS-CoV-2 by FDA under an Emergency Use Authorization (EUA). This EUA will remain  in effect (meaning this test can be used) for the duration of the COVID-19 declaration under Section 564(b)(1) of the Act, 21 U.S.C.section 360bbb-3(b)(1), unless the authorization is terminated  or revoked sooner.       Influenza A by PCR NEGATIVE NEGATIVE Final    Influenza B by PCR NEGATIVE NEGATIVE Final    Comment: (NOTE) The Xpert Xpress SARS-CoV-2/FLU/RSV plus assay is intended as an aid in the diagnosis of influenza from Nasopharyngeal swab specimens and should not be used as a sole basis for treatment. Nasal washings and aspirates are unacceptable for Xpert Xpress SARS-CoV-2/FLU/RSV testing.  Fact Sheet for Patients: EntrepreneurPulse.com.au  Fact Sheet for Healthcare Providers: IncredibleEmployment.be  This test is not yet approved or cleared by the [place] FDA and has been authorized for detection and/or diagnosis of SARS-CoV-2 by FDA under an Emergency Use Authorization (EUA). This EUA will remain in effect (meaning this test can be used) for the duration of the COVID-19 declaration under Section 564(b)(1) of the Act, 21 U.S.C. section 360bbb-3(b)(1), unless the authorization is terminated or revoked.  Performed at [facility], [place] [address], [place], [place] [address]         Radiology Studies: No results found.      Scheduled Meds:  amLODipine  5 mg Oral Daily   aspirin  81 mg Oral Daily   enoxaparin (LOVENOX) injection  40 mg Subcutaneous Q24H   insulin aspart  0-9 Units Subcutaneous TID WC  levothyroxine  50 mcg Oral Q0600   pantoprazole  40 mg Oral Daily   rosuvastatin  5 mg Oral Daily   sodium chloride  1 g Oral TID WC   Continuous Infusions:   LOS: 0 days     Cordelia Poche, MD [facility] 05/25/2021, 4:28 PM  If 7PM-7AM, please contact night-coverage [email or web address]

## 2021-05-26 DIAGNOSIS — E871 Hypo-osmolality and hyponatremia: Secondary | ICD-10-CM | POA: Diagnosis not present

## 2021-05-26 DIAGNOSIS — E1165 Type 2 diabetes mellitus with hyperglycemia: Secondary | ICD-10-CM | POA: Diagnosis not present

## 2021-05-26 DIAGNOSIS — N179 Acute kidney failure, unspecified: Secondary | ICD-10-CM | POA: Diagnosis not present

## 2021-05-26 DIAGNOSIS — I10 Essential (primary) hypertension: Secondary | ICD-10-CM | POA: Diagnosis not present

## 2021-05-26 LAB — BASIC METABOLIC PANEL
Anion gap: 9 (ref 5–15)
Anion gap: 12 (ref 5–15)
BUN: 22 mg/dL (ref 8–23)
BUN: 29 mg/dL — ABNORMAL HIGH (ref 8–23)
CO2: 26 mmol/L (ref 22–32)
CO2: 22 mmol/L (ref 22–32)
Calcium: 9 mg/dL (ref 8.9–10.3)
Calcium: 9 mg/dL (ref 8.9–10.3)
Chloride: 95 mmol/L — ABNORMAL LOW (ref 98–111)
Chloride: 94 mmol/L — ABNORMAL LOW (ref 98–111)
Creatinine, Ser: 1.15 mg/dL — ABNORMAL HIGH (ref 0.44–1.00)
Creatinine, Ser: 1.28 mg/dL — ABNORMAL HIGH (ref 0.44–1.00)
GFR, Estimated: 50 mL/min — ABNORMAL LOW (ref >60)
GFR, Estimated: 44 mL/min — ABNORMAL LOW (ref >60)
Glucose, Bld: 107 mg/dL — ABNORMAL HIGH (ref 70–99)
Glucose, Bld: 115 mg/dL — ABNORMAL HIGH (ref 70–99)
Potassium: 4 mmol/L (ref 3.5–5.1)
Potassium: 4.9 mmol/L (ref 3.5–5.1)
Sodium: 130 mmol/L — ABNORMAL LOW (ref 135–145)
Sodium: 128 mmol/L — ABNORMAL LOW (ref 135–145)

## 2021-05-26 LAB — GLUCOSE, CAPILLARY
Glucose-Capillary: 140 mg/dL — ABNORMAL HIGH (ref 70–99)
Glucose-Capillary: 136 mg/dL — ABNORMAL HIGH (ref 70–99)
Glucose-Capillary: 130 mg/dL — ABNORMAL HIGH (ref 70–99)
Glucose-Capillary: 212 mg/dL — ABNORMAL HIGH (ref 70–99)

## 2021-05-26 NOTE — Progress Notes (Signed)
PROGRESS NOTE    [name]  [id number] DOB: 04-01-1946 DOA: 05/23/2021 PCP: Molly Frees, MD   Brief Narrative: [name] is a 75 y.o. female with a history of endometrial cancer s/p hysterectomy, hypertension, diabetes mellitus type 2. Patient presented secondary to abnormal serum sodium of 127 and found to have a sodium of 126. Associated cramping in bilateral thighs.    Assessment & Plan:   Principal Problem:   Hyponatremia Active Problems:   Essential hypertension, benign   Diabetes ([place])   Hyponatremia Urine studies suggest SIADH picture. Normal TSH and cortisol. Corrected sodium for glucose of 128. Some initial improvement of sodium but now worsening with associated AKI. Started salt tablets and Nephrology consulted on 7/25. Improvement to a sodium of 130 today -Continue fluid restricted diet (< 800 mL) -Nephrology recommendations: Lasix, discontinue salt tablets, continue fluid restriction  AKI Seems to have stabilized. Nephrology consulted as mentioned above. -BMP in AM  Primary hypertension -Continue amlodipine  Diabetes mellitus, type 2 -Continue SSI  Hypothyroidism -Continue Synthroid  GERD -Continue Protonix  Epigastric pain Patient with Coronary CT from last year with no evidence of CAD. Her pain is likely related to her known gastritis/GI issues. Patient was meant to have an upper endoscopy in December of 2021 but that did not happen. -Continue Protonix as mentioned above -Outpatient GI follow-up  History of endometrial cancer No known metastatic disease. Patient follows with gynecology oncology and is s/p total hysterectomy with bilateral salpingo oophorectomy  Symptomatic bradycardia S/p pacemaker. Follows with Dr. Rayann Mccoy, EP.  R-on-T PVC Discussed with cardiology who will interrogate patient's pacemaker   DVT prophylaxis: Lovenox Code Status:   Code Status: Full Code Family Communication: Husband at bedside Disposition  Plan: Discharge tomorrow if sodium remains stable and pending discharge recommendations from nephrology  Consultants:  Nephrology  Procedures:  None  Antimicrobials: None    Subjective: Still having some epigastric pain seemingly related to history of gastritis. Patient plans on following up with her GI physician as an outpatient. Cramps in her legs are better. Patient noted telemetry notification of R-on-T PVC  Objective: Vitals:   05/26/21 0048 05/26/21 0322 05/26/21 0837 05/26/21 1159  BP: 129/65 111/66 126/76 115/67  Pulse: 60 60 69 63  Resp: 18 18 (!) 24 16  Temp: (!) 97.5 F (36.4 C) 97.8 F (36.6 C) 98.3 F (36.8 C) 98.4 F (36.9 C)  TempSrc: Oral Oral Oral Oral  SpO2: 95% 96% 98% 95%  Weight:      Height:        Intake/Output Summary (Last 24 hours) at 05/26/2021 1415 Last data filed at 05/26/2021 1202 Gross per 24 hour  Intake 160 ml  Output 2100 ml  Net -1940 ml    Filed Weights   05/24/21 1432 05/25/21 0400 05/25/21 0455  Weight: 80.2 kg 82.7 kg 82.7 kg    Examination:  General exam: Appears calm and comfortable Respiratory system: Clear to auscultation. Respiratory effort normal. Cardiovascular system: S1 & S2 heard, RRR. No murmurs, rubs, gallops or clicks. Gastrointestinal system: Abdomen is nondistended, soft and nontender. No organomegaly or masses felt. Normal bowel sounds heard. Central nervous system: Alert and oriented. No focal neurological deficits. Musculoskeletal: No edema. No calf tenderness Skin: No cyanosis. No rashes Psychiatry: Judgement and insight appear normal. Mood & affect appropriate.     Data Reviewed: I have personally reviewed following labs and imaging studies  CBC Lab Results  Component Value Date   WBC 7.6 05/24/2021  RBC 4.86 05/24/2021   HGB 14.4 05/24/2021   HCT 41.8 05/24/2021   MCV 86.0 05/24/2021   MCH 29.6 05/24/2021   PLT 217 05/24/2021   MCHC 34.4 05/24/2021   RDW 13.2 05/24/2021   LYMPHSABS 1.7  05/23/2021   MONOABS 0.6 05/23/2021   EOSABS 0.2 05/23/2021   BASOSABS 0.0 Q000111Q     Last metabolic panel Lab Results  Component Value Date   Molly 130 (L) 05/26/2021   K 4.0 05/26/2021   CL 95 (L) 05/26/2021   CO2 26 05/26/2021   BUN 22 05/26/2021   CREATININE 1.15 (H) 05/26/2021   GLUCOSE 107 (H) 05/26/2021   GFRNONAA 50 (L) 05/26/2021   GFRAA >60 07/21/2020   CALCIUM 9.0 05/26/2021   PHOS 3.2 10/16/2019   PROT 6.6 05/23/2021   ALBUMIN 4.1 05/23/2021   BILITOT 0.6 05/23/2021   ALKPHOS 76 05/23/2021   AST 18 05/23/2021   ALT 16 05/23/2021   ANIONGAP 9 05/26/2021    CBG (last 3)  Recent Labs    05/25/21 2050 05/26/21 0755 05/26/21 1200  GLUCAP 135* 140* 136*      GFR: Estimated Creatinine Clearance: 45.3 mL/min (A) (by C-G formula based on SCr of 1.15 mg/dL (H)).  Coagulation Profile: No results for input(s): INR, PROTIME in the last 168 hours.  Recent Results (from the past 240 hour(s))  Resp Panel by RT-PCR (Flu A&B, Covid) Nasopharyngeal Swab     Status: None   Collection Time: 05/24/21  3:15 AM   Specimen: Nasopharyngeal Swab; Nasopharyngeal(NP) swabs in vial transport medium  Result Value Ref Range Status   SARS Coronavirus 2 by RT PCR NEGATIVE NEGATIVE Final    Comment: (NOTE) SARS-CoV-2 target nucleic acids are NOT DETECTED.  The SARS-CoV-2 RNA is generally detectable in upper respiratory specimens during the acute phase of infection. The lowest concentration of SARS-CoV-2 viral copies this assay can detect is 138 copies/mL. A negative result does not preclude SARS-Cov-2 infection and should not be used as the sole basis for treatment or other patient management decisions. A negative result may occur with  improper specimen collection/handling, submission of specimen other than nasopharyngeal swab, presence of viral mutation(s) within the areas targeted by this assay, and inadequate number of viral copies(<138 copies/mL). A negative result must  be combined with clinical observations, patient history, and epidemiological information. The expected result is Negative.  Fact Sheet for Patients:  EntrepreneurPulse.com.au  Fact Sheet for Healthcare Providers:  IncredibleEmployment.be  This test is no t yet approved or cleared by the [place] FDA and  has been authorized for detection and/or diagnosis of SARS-CoV-2 by FDA under an Emergency Use Authorization (EUA). This EUA will remain  in effect (meaning this test can be used) for the duration of the COVID-19 declaration under Section 564(b)(1) of the Act, 21 U.S.C.section 360bbb-3(b)(1), unless the authorization is terminated  or revoked sooner.       Influenza A by PCR NEGATIVE NEGATIVE Final   Influenza B by PCR NEGATIVE NEGATIVE Final    Comment: (NOTE) The Xpert Xpress SARS-CoV-2/FLU/RSV plus assay is intended as an aid in the diagnosis of influenza from Nasopharyngeal swab specimens and should not be used as a sole basis for treatment. Nasal washings and aspirates are unacceptable for Xpert Xpress SARS-CoV-2/FLU/RSV testing.  Fact Sheet for Patients: EntrepreneurPulse.com.au  Fact Sheet for Healthcare Providers: IncredibleEmployment.be  This test is not yet approved or cleared by the [place] FDA and has been authorized for detection and/or diagnosis of  SARS-CoV-2 by FDA under an Emergency Use Authorization (EUA). This EUA will remain in effect (meaning this test can be used) for the duration of the COVID-19 declaration under Section 564(b)(1) of the Act, 21 U.S.C. section 360bbb-3(b)(1), unless the authorization is terminated or revoked.  Performed at [facility], [place] [address] [place], [place] [address]          Radiology Studies: No results found.      Scheduled Meds:  amLODipine  5 mg Oral Daily   aspirin  81 mg Oral Daily   enoxaparin (LOVENOX)  injection  40 mg Subcutaneous Q24H   furosemide  40 mg Oral Daily   insulin aspart  0-9 Units Subcutaneous TID WC   levothyroxine  50 mcg Oral Q0600   pantoprazole  40 mg Oral Daily   rosuvastatin  5 mg Oral Daily   Continuous Infusions:   LOS: 1 day     Cordelia Poche, MD [facility] 05/26/2021, 2:15 PM  If 7PM-7AM, please contact night-coverage [email or web address]

## 2021-05-26 NOTE — Plan of Care (Signed)

## 2021-05-26 NOTE — Progress Notes (Signed)
[facility] Progress Note    75 y.o. female  endometrial carcinoma s/p surgery, HTN , T2DM p/w  sodium of 127 but w/ h/o hyponatremia x 20 years.  Her sodium levels  fluctuatess between 127-135. Patient reports she had total hysterectomy in December 2021 and since then she has had multiple UTIs.  She completed antibiotics for her last UTI a few weeks ago.  Last week, she started experiencing headaches and muscle cramps.  She saw her PCP in as to have her sodium rechecked and it came back low at 127.      Assessment/ Plan:    Hyponatremia: Patient with a history of chronic SIADH found to have acute drop in sodium to 126 likely secondary to recent UTI. Fluid restriction initiated on admission. Sodium initially improved to 130 but trended down to 126 now up to 128.  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 She continues to have mild symptoms. She is euvolemic on exam but has some trace bilateral lower extremity edema. We will try a dose of loop diuretic and monitor closely. --Lasix 40 mg daily -> once stable decrease to '20mg'$  daily and monitor response --Discontinued salt tablets on 7/25 --Continue fluid restriction diet(1200 cc) --High-protein and salt diet; mainly need adequate solute intake which she is successfully consuming  2.  Hypertension: Presented with elevated BP. SBP now in the 110s to 130s.  Will need to watch BP closely in the setting of fluid restriction and diuresis.             -- Continue amlodipine 5 mg             -- Started Lasix 40 mg daily on 7/25 3. T2DM: CBGs in the 110s to 180s.  Management per primary team 4.  Hypothyroidism: Continue Synthroid 50 mcg daily 5.  History of endometrial cancer status post total hysterectomy in December 2021   Subjective:   Feeling better, still intermittent h/a's but better. Denies nausea, fever, chills, dyspnea. Good appetite; no  events overnight.   Objective:   BP 111/66 (BP Location: Left Arm)   Pulse 60   Temp 97.8 F (36.6 C) (Oral)   Resp 18   Ht '5\' 4"'$  (1.626 m)   Wt 82.7 kg   SpO2 96%   BMI 31.30 kg/m   Intake/Output Summary (Last 24 hours) at 05/26/2021 [id number] Last data filed at 05/26/2021 0326 Gross per 24 hour  Intake 600 ml  Output 1950 ml  Net -1350 ml   Weight change:   Physical Exam: General: Pleasant, well-appearing elderly woman laying in bed. No acute distress. Head: Normocephalic. Atraumatic. CV: RRR. No murmurs, rubs, or gallops.  Trace BLE pitting edema Pulmonary: Lungs CTAB. Normal effort. No wheezing or rales. Abdominal: Soft, nontender, nondistended. Normal bowel sounds. Extremities: Palpable radial and DP pulses. Normal ROM. Skin: Warm and dry. No obvious rash or lesions. Neuro: A&Ox3. Moves all extremities. Normal sensation. No focal deficit. Psych: Normal mood and affect  Imaging: No results found.  Labs: BMET Recent Labs  Lab 05/23/21 1506 05/24/21 0630 05/24/21 1636 05/25/21 0602 05/25/21 1344 05/25/21 2243 05/26/21 0250  NA 126* 130* 126* 126* 128* 128* 130*  K 4.7 4.0 4.5 4.2 4.3  --  4.0  CL 95* 95* 95* 96* 96*  --  95*  CO2 '22 24 23 23 22  '$ --  26  GLUCOSE 201* 124* 133* 119* 161*  --  107*  BUN '19 17 20 18 21  '$ --  22  CREATININE 1.04* 0.87 0.96 0.96 1.27*  --  1.15*  CALCIUM 9.2 9.2 9.5 8.8* 9.2  --  9.0   CBC Recent Labs  Lab 05/23/21 1503 05/24/21 0630  WBC 7.3 7.6  NEUTROABS 4.7  --   HGB 14.5 14.4  HCT 42.9 41.8  MCV 87.2 86.0  PLT 223 217    Medications:     amLODipine  5 mg Oral Daily   aspirin  81 mg Oral Daily   enoxaparin (LOVENOX) injection  40 mg Subcutaneous Q24H   furosemide  40 mg Oral Daily   insulin aspart  0-9 Units Subcutaneous TID WC   levothyroxine  50 mcg Oral Q0600   pantoprazole  40 mg Oral Daily   rosuvastatin  5 mg Oral Daily      Otelia Santee, MD 05/26/2021, 7:02 AM

## 2021-05-27 DIAGNOSIS — E1165 Type 2 diabetes mellitus with hyperglycemia: Secondary | ICD-10-CM | POA: Diagnosis not present

## 2021-05-27 DIAGNOSIS — E871 Hypo-osmolality and hyponatremia: Secondary | ICD-10-CM | POA: Diagnosis not present

## 2021-05-27 LAB — URINALYSIS, ROUTINE W REFLEX MICROSCOPIC
Bilirubin Urine: NEGATIVE
Glucose, UA: >=500 mg/dL — AB
Hgb urine dipstick: NEGATIVE
Ketones, ur: NEGATIVE mg/dL
Nitrite: NEGATIVE
Protein, ur: NEGATIVE mg/dL
Specific Gravity, Urine: 1.018 (ref 1.005–1.030)
pH: 5 (ref 5.0–8.0)

## 2021-05-27 LAB — BASIC METABOLIC PANEL WITH GFR
Anion gap: 9 (ref 5–15)
Anion gap: 9 (ref 5–15)
BUN: 30 mg/dL — ABNORMAL HIGH (ref 8–23)
BUN: 26 mg/dL — ABNORMAL HIGH (ref 8–23)
CO2: 23 mmol/L (ref 22–32)
CO2: 26 mmol/L (ref 22–32)
Calcium: 9.1 mg/dL (ref 8.9–10.3)
Calcium: 9.1 mg/dL (ref 8.9–10.3)
Chloride: 95 mmol/L — ABNORMAL LOW (ref 98–111)
Chloride: 95 mmol/L — ABNORMAL LOW (ref 98–111)
Creatinine, Ser: 1.08 mg/dL — ABNORMAL HIGH (ref 0.44–1.00)
Creatinine, Ser: 1.2 mg/dL — ABNORMAL HIGH (ref 0.44–1.00)
GFR, Estimated: 54 mL/min — ABNORMAL LOW
GFR, Estimated: 48 mL/min — ABNORMAL LOW
Glucose, Bld: 115 mg/dL — ABNORMAL HIGH (ref 70–99)
Glucose, Bld: 115 mg/dL — ABNORMAL HIGH (ref 70–99)
Potassium: 4.2 mmol/L (ref 3.5–5.1)
Potassium: 4.2 mmol/L (ref 3.5–5.1)
Sodium: 127 mmol/L — ABNORMAL LOW (ref 135–145)
Sodium: 130 mmol/L — ABNORMAL LOW (ref 135–145)

## 2021-05-27 LAB — GLUCOSE, CAPILLARY
Glucose-Capillary: 137 mg/dL — ABNORMAL HIGH (ref 70–99)
Glucose-Capillary: 134 mg/dL — ABNORMAL HIGH (ref 70–99)
Glucose-Capillary: 130 mg/dL — ABNORMAL HIGH (ref 70–99)
Glucose-Capillary: 156 mg/dL — ABNORMAL HIGH (ref 70–99)

## 2021-05-27 LAB — OSMOLALITY, URINE: Osmolality, Ur: 708 mosm/kg (ref 300–900)

## 2021-05-27 MED ORDER — SODIUM CHLORIDE 1 G PO TABS
1.0000 g | ORAL_TABLET | Freq: Three times a day (TID) | ORAL | Status: DC
  Administered 2021-05-27 – 2021-05-28 (×4): 1 g via ORAL
  Filled 2021-05-27 (×6): qty 1

## 2021-05-27 NOTE — Progress Notes (Signed)
PROGRESS NOTE    [name]  [id number] DOB: 05-18-1946 DOA: 05/23/2021 PCP: Shirline Frees, MD   Brief Narrative: [name] is a 75 y.o. female with a history of endometrial cancer s/p hysterectomy, hypertension, diabetes mellitus type 2. Patient presented secondary to abnormal serum sodium of 127 and found to have a sodium of 126. Associated cramping in bilateral thighs.    Assessment & Plan:   Principal Problem:   Hyponatremia Active Problems:   Essential hypertension, benign   Diabetes (HCC)   Hyponatremia -Measurement prerenal, patient with known history of chronic SIADH, presents with acute drop in her sodium to 126, with some symptom eluding cramps, weakness, she was recently treated with UTI x2. -Despite being on Lasix, fluid restriction, her sodium trending down, new with Lasix 40 mg oral daily, she was started on salt tablets by renal, will increase her fluid restriction from 800-12 100, change her diet to regular. - Normal TSH and cortisol. Corrected sodium for glucose of 128. Some initial improvement of sodium but now worsening with associated AKI. Started salt tablets and Nephrology consulted on 7/25. Improvement to a sodium of 130 today  AKI Seems to have stabilized. Nephrology consulted as mentioned above.  Primary hypertension -Continue amlodipine  Diabetes mellitus, type 2 -Continue SSI  Hypothyroidism -Continue Synthroid  GERD -Continue Protonix  Epigastric pain Patient with Coronary CT from last year with no evidence of CAD. Her pain is likely related to her known gastritis/GI issues. Patient was meant to have an upper endoscopy in December of 2021 but that did not happen. -Continue Protonix as mentioned above -Outpatient GI follow-up  History of endometrial cancer No known metastatic disease. Patient follows with gynecology oncology and is s/p total hysterectomy with bilateral salpingo oophorectomy  Symptomatic bradycardia S/p  pacemaker. Follows with Dr. [name], EP.  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              Difficult to place patient No    Consultants:  Nephrology  Procedures:  None  Antimicrobials: None    Subjective:  She denies any abdominal pain, nausea or vomiting    Objective: Vitals:   05/27/21 0011 05/27/21 0502 05/27/21 0820 05/27/21 1241  BP: 122/60 112/66 133/78 129/74  Pulse: 60 60 62 61  Resp: '17 19 18 20  '$ Temp: 97.8 F (36.6 C) 97.8 F (36.6 C) 98.6 F (37 C) 98.2 F (36.8 C)  TempSrc: Oral Oral Oral Oral  SpO2: 97% 93% 95% 95%  Weight:      Height:        Intake/Output Summary (Last 24 hours) at 05/27/2021 1329 Last data filed at 05/27/2021 1100 Gross per 24 hour  Intake 538 ml  Output 1055 ml  Net -517 ml   Filed Weights   05/24/21 1432 05/25/21 0400 05/25/21 0455  Weight: 80.2 kg 82.7 kg 82.7 kg    Examination:  Awake Alert, Oriented X 3, No new F.N deficits, Normal affect Symmetrical Chest wall movement, Good  air movement bilaterally, CTAB RRR,No Gallops,Rubs or new Murmurs, No Parasternal Heave +ve B.Sounds, Abd Soft, No tenderness, No rebound - guarding or rigidity. No Cyanosis, Clubbing or edema, No new Rash or bruise       Data Reviewed: I have personally reviewed following labs and imaging studies  CBC Lab Results  Component Value Date   WBC 7.6 05/24/2021   RBC 4.86 05/24/2021   HGB 14.4 05/24/2021   HCT 41.8 05/24/2021   MCV 86.0 05/24/2021   MCH 29.6 05/24/2021   PLT 217 05/24/2021   MCHC 34.4 05/24/2021   RDW 13.2  05/24/2021   LYMPHSABS 1.7 05/23/2021   MONOABS 0.6 05/23/2021   EOSABS 0.2 05/23/2021   BASOSABS 0.0 [id number]     Last metabolic panel Lab Results  Component Value Date   NA 127 (L) 05/27/2021   K 4.2 05/27/2021   CL 95 (L) 05/27/2021   CO2 23 05/27/2021   BUN 30 (H) 05/27/2021   CREATININE 1.08 (H) 05/27/2021   GLUCOSE 115 (H) 05/27/2021   GFRNONAA 54 (L) 05/27/2021   GFRAA >60 07/21/2020   CALCIUM 9.1 05/27/2021   PHOS 3.2 10/16/2019   PROT 6.6 05/23/2021   ALBUMIN 4.1 05/23/2021   BILITOT 0.6 05/23/2021   ALKPHOS 76 05/23/2021   AST 18 05/23/2021   ALT 16 05/23/2021   ANIONGAP 9 05/27/2021    CBG (last 3)  Recent Labs    05/26/21 2004 05/27/21 0825 05/27/21 1239  GLUCAP 212* 137* 134*     GFR: Estimated Creatinine Clearance: 48.3 mL/min (A) (by C-G formula based on SCr of 1.08 mg/dL (H)).  Coagulation Profile: No results for input(s): INR, PROTIME in the last 168 hours.  Recent Results (from the past 240 hour(s))  Resp Panel by RT-PCR (Flu A&B, Covid) Nasopharyngeal Swab     Status: None   Collection Time: 05/24/21  3:15 AM   Specimen: Nasopharyngeal Swab; Nasopharyngeal(NP) swabs in vial transport medium  Result Value Ref Range Status   SARS Coronavirus 2 by RT PCR NEGATIVE NEGATIVE Final    Comment: (NOTE) SARS-CoV-2 target nucleic acids are NOT DETECTED.  The SARS-CoV-2 RNA is generally detectable in upper respiratory specimens during the acute phase of infection. The lowest concentration of SARS-CoV-2 viral copies this assay can detect is 138 copies/mL. A negative result does not preclude SARS-Cov-2 infection and should not be used as the sole basis for treatment or other patient management decisions. A negative result may occur with  improper specimen collection/handling, submission of specimen other than nasopharyngeal swab, presence of viral mutation(s) within the areas targeted by this assay, and inadequate number of viral copies(<138  copies/mL). A negative result must be combined with clinical observations, patient history, and epidemiological information. The expected result is Negative.  Fact Sheet for Patients:  EntrepreneurPulse.com.au  Fact Sheet for Healthcare Providers:  IncredibleEmployment.be  This test is no t yet approved or cleared by the [place] FDA and  has been authorized for detection and/or diagnosis of SARS-CoV-2 by FDA under an Emergency Use Authorization (EUA). This EUA will remain  in effect (meaning this test can be used) for the duration of the COVID-19 declaration under Section 564(b)(1) of the Act, 21 U.S.C.section 360bbb-3(b)(1), unless the authorization is terminated  or revoked sooner.       Influenza A by PCR NEGATIVE NEGATIVE Final   Influenza B by PCR NEGATIVE NEGATIVE Final    Comment: (NOTE) The Xpert Xpress SARS-CoV-2/FLU/RSV plus assay is intended as an aid in  the diagnosis of influenza from Nasopharyngeal swab specimens and should not be used as a sole basis for treatment. Nasal washings and aspirates are unacceptable for Xpert Xpress SARS-CoV-2/FLU/RSV testing.  Fact Sheet for Patients: EntrepreneurPulse.com.au  Fact Sheet for Healthcare Providers: IncredibleEmployment.be  This test is not yet approved or cleared by the [place] FDA and has been authorized for detection and/or diagnosis of SARS-CoV-2 by FDA under an Emergency Use Authorization (EUA). This EUA will remain in effect (meaning this test can be used) for the duration of the COVID-19 declaration under Section 564(b)(1) of the Act, 21 U.S.C. section 360bbb-3(b)(1), unless the authorization is terminated or revoked.  Performed at [facility], [place] [address] [place], [place] [address]         Radiology Studies: No results found.      Scheduled Meds:  amLODipine  5 mg Oral Daily   aspirin  81 mg Oral Daily    enoxaparin (LOVENOX) injection  40 mg Subcutaneous Q24H   furosemide  40 mg Oral Daily   insulin aspart  0-9 Units Subcutaneous TID WC   levothyroxine  50 mcg Oral Q0600   pantoprazole  40 mg Oral Daily   rosuvastatin  5 mg Oral Daily   sodium chloride  1 g Oral TID WC   Continuous Infusions:   LOS: 2 days     Cordelia Poche, MD [facility] 05/27/2021, 1:29 PM  If 7PM-7AM, please contact night-coverage [email or web address]

## 2021-05-27 NOTE — Progress Notes (Signed)
[facility] Progress Note    75 y.o. female  endometrial carcinoma s/p surgery, HTN , T2DM p/w  sodium of 127 but w/ h/o hyponatremia x 20 years.  Her sodium levels  fluctuatess between 127-135. Patient reports she had total hysterectomy in December 2021 and since then she has had multiple UTIs.  She completed antibiotics for her last UTI a few weeks ago.  Last week, she started experiencing headaches and muscle cramps.  She saw her PCP in as to have her sodium rechecked and it came back low at 127.      Assessment/ Plan:    Hyponatremia: Patient with a history of chronic SIADH found to have acute drop in sodium to 126 likely secondary to recent UTI. Fluid restriction initiated on admission. Sodium initially improved to 130 but trended down to 126 now up to 128.  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 She continues to have mild symptoms. She is euvolemic on exam but has some trace bilateral lower extremity edema. We will try a dose of loop diuretic and monitor closely. --Lasix 40 mg daily -> once stable decrease to 20 mg daily and monitor response --Will have to restart salt tablets in the acute setting as she's not holding with Lasix alone. Recheck urine osm and also u/a given dysuria. --Continue fluid restriction diet(1200 cc) --High-protein and salt diet (currently on low salt diet); mainly need adequate solute intake which she is successfully consuming  2.  Hypertension: Presented with elevated BP. SBP now in the 110s to 130s.  Will need to watch BP closely in the setting of fluid restriction and diuresis.             -- Continue amlodipine 5 mg             -- Started Lasix 40 mg daily on 7/25 3. T2DM: CBGs in the 110s to 180s.  Management per primary team 4.  Hypothyroidism: Continue Synthroid 50 mcg daily 5.  History of endometrial cancer status post total hysterectomy in December  2021   Subjective:   Feeling better, still intermittent h/a's but better. Denies nausea, fever, chills, dyspnea but has dysuria. Good appetite; no events overnight.   Objective:   BP 112/66 (BP Location: Left Arm)   Pulse 60   Temp 97.8 F (36.6 C) (Oral)   Resp 19   Ht '5\' 4"'$  (1.626 m)   Wt 82.7 kg   SpO2 93%   BMI 31.30 kg/m   Intake/Output Summary (Last 24 hours) at 05/27/2021 [id number] Last data filed at 05/26/2021 2200 Gross per 24 hour  Intake 418 ml  Output 625 ml  Net -207 ml   Weight change:   Physical Exam: General: Pleasant, well-appearing elderly woman laying in bed. No acute distress. Head: Normocephalic. Atraumatic. CV: RRR. No murmurs, rubs, or gallops.  Trace BLE pitting edema Pulmonary: Lungs CTAB. Normal effort. No wheezing or rales. Abdominal: Soft, nontender, nondistended. Normal bowel sounds. Extremities: Palpable radial and DP pulses. Normal ROM. Skin: Warm and dry. No obvious rash or lesions. Neuro: A&Ox3. Moves all extremities. Normal sensation. No focal deficit. Psych: Normal mood and affect  Imaging: No results found.  Labs: BMET Recent Labs  Lab 05/24/21 0630 05/24/21 1636 05/25/21 0602 05/25/21 1344 05/25/21 2243 05/26/21 0250 05/26/21 1625 05/27/21 0410  NA 130* 126* 126* 128* 128* 130* 128* 127*  K 4.0 4.5 4.2 4.3  --  4.0 4.9 4.2  CL 95* 95* 96* 96*  --  95* 94* 95*  CO2 '24 23 23 22  '$ --  '26 22 23  '$ GLUCOSE 124* 133* 119* 161*  --  107* 115* 115*  BUN '17 20 18 21  '$ --  22 29* 30*  CREATININE 0.87 0.96 0.96 1.27*  --  1.15* 1.28* 1.08*  CALCIUM 9.2 9.5 8.8* 9.2  --  9.0 9.0 9.1   CBC Recent Labs  Lab 05/23/21 1503 05/24/21 0630  WBC 7.3 7.6  NEUTROABS 4.7  --   HGB 14.5 14.4  HCT 42.9 41.8  MCV 87.2 86.0  PLT 223 217    Medications:     amLODipine  5 mg Oral Daily   aspirin  81 mg Oral Daily   enoxaparin (LOVENOX) injection  40 mg Subcutaneous Q24H   furosemide  40 mg Oral Daily   insulin aspart  0-9 Units  Subcutaneous TID WC   levothyroxine  50 mcg Oral Q0600   pantoprazole  40 mg Oral Daily   rosuvastatin  5 mg Oral Daily      Otelia Santee, MD 05/27/2021, 7:04 AM

## 2021-05-28 DIAGNOSIS — E871 Hypo-osmolality and hyponatremia: Secondary | ICD-10-CM | POA: Diagnosis not present

## 2021-05-28 DIAGNOSIS — I10 Essential (primary) hypertension: Secondary | ICD-10-CM | POA: Diagnosis not present

## 2021-05-28 DIAGNOSIS — E1165 Type 2 diabetes mellitus with hyperglycemia: Secondary | ICD-10-CM | POA: Diagnosis not present

## 2021-05-28 LAB — BASIC METABOLIC PANEL
Anion gap: 8 (ref 5–15)
BUN: 25 mg/dL — ABNORMAL HIGH (ref 8–23)
CO2: 26 mmol/L (ref 22–32)
Calcium: 8.7 mg/dL — ABNORMAL LOW (ref 8.9–10.3)
Chloride: 93 mmol/L — ABNORMAL LOW (ref 98–111)
Creatinine, Ser: 1.19 mg/dL — ABNORMAL HIGH (ref 0.44–1.00)
GFR, Estimated: 48 mL/min — ABNORMAL LOW (ref >60)
Glucose, Bld: 104 mg/dL — ABNORMAL HIGH (ref 70–99)
Potassium: 3.7 mmol/L (ref 3.5–5.1)
Sodium: 127 mmol/L — ABNORMAL LOW (ref 135–145)

## 2021-05-28 LAB — GLUCOSE, CAPILLARY
Glucose-Capillary: 148 mg/dL — ABNORMAL HIGH (ref 70–99)
Glucose-Capillary: 117 mg/dL — ABNORMAL HIGH (ref 70–99)

## 2021-05-28 MED ORDER — SODIUM CHLORIDE 1 G PO TABS: 1.0000 g | ORAL_TABLET | Freq: Two times a day (BID) | ORAL | 0 refills | Status: DC

## 2021-05-28 MED ORDER — FUROSEMIDE 20 MG PO TABS: 20.0000 mg | ORAL_TABLET | Freq: Two times a day (BID) | ORAL | 0 refills | Status: DC

## 2021-05-28 MED ORDER — TOLVAPTAN 15 MG PO TABS
15.0000 mg | ORAL_TABLET | Freq: Once | ORAL | Status: DC
  Filled 2021-05-28: qty 1

## 2021-05-28 MED ORDER — FUROSEMIDE 40 MG PO TABS
40.0000 mg | ORAL_TABLET | Freq: Every day | ORAL | Status: DC
  Administered 2021-05-28: 40 mg via ORAL
  Filled 2021-05-28: qty 1

## 2021-05-28 NOTE — Discharge Instructions (Signed)
Follow with Primary MD Shirline Frees, MD in 7 days   Get CBC, CMP, checked  by Primary MD next visit.    Activity: As tolerated with Full fall precautions use walker/cane & assistance as needed   Disposition Home    Diet: Regular diet   On your next visit with your primary care physician please Get Medicines reviewed and adjusted.   Please request your Prim.MD to go over all Hospital Tests and Procedure/Radiological results at the follow up, please get all Hospital records sent to your Prim MD by signing hospital release before you go home.   If you experience worsening of your admission symptoms, develop shortness of breath, life threatening emergency, suicidal or homicidal thoughts you must seek medical attention immediately by calling 911 or calling your MD immediately  if symptoms less severe.  You Must read complete instructions/literature along with all the possible adverse reactions/side effects for all the Medicines you take and that have been prescribed to you. Take any new Medicines after you have completely understood and accpet all the possible adverse reactions/side effects.   Do not drive, operating heavy machinery, perform activities at heights, swimming or participation in water activities or provide baby sitting services if your were admitted for syncope or siezures until you have seen by Primary MD or a Neurologist and advised to do so again.  Do not drive when taking Pain medications.    Do not take more than prescribed Pain, Sleep and Anxiety Medications  Special Instructions: If you have smoked or chewed Tobacco  in the last 2 yrs please stop smoking, stop any regular Alcohol  and or any Recreational drug use.  Wear Seat belts while driving.   Please note  You were cared for by a hospitalist during your hospital stay. If you have any questions about your discharge medications or the care you received while you were in the hospital after you are discharged,  you can call the unit and asked to speak with the hospitalist on call if the hospitalist that took care of you is not available. Once you are discharged, your primary care physician will handle any further medical issues. Please note that NO REFILLS for any discharge medications will be authorized once you are discharged, as it is imperative that you return to your primary care physician (or establish a relationship with a primary care physician if you do not have one) for your aftercare needs so that they can reassess your need for medications and monitor your lab values.

## 2021-05-28 NOTE — Progress Notes (Addendum)
[facility] Progress Note    75 y.o. female  endometrial carcinoma s/p surgery, HTN , T2DM p/w  sodium of 127 but w/ h/o hyponatremia x 20 years.  Her sodium levels  fluctuatess between 127-135. Patient reports she had total hysterectomy in December 2021 and since then she has had multiple UTIs.  She completed antibiotics for her last UTI a few weeks ago.  Last week, she started experiencing headaches and muscle cramps.  She saw her PCP in as to have her sodium rechecked and it came back low at 127.      Assessment/ Plan:    Hyponatremia: Patient with a history of chronic SIADH found to have acute drop in sodium to 126 likely secondary to recent UTI. Fluid restriction initiated on admission. Sodium initially improved to 130 but trended down to 126 now up to 128.  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 She continues to have mild symptoms. She is euvolemic on exam but has some trace bilateral lower extremity edema. We will try a dose of loop diuretic and monitor closely. --Lasix 40 mg AM and add '20mg'$  in the PM.  --Restarted salt tablets in the acute setting as she's not holding with Lasix alone. Recheck urine osm -> still very high at 708. Partially from glycosuria --Continue fluid restriction diet(1200 cc) --High-protein and salt diet (currently on low salt diet); mainly need adequate solute intake which she is successfully consuming - f/u with [facility] in 2 weeks w/ labs  2.  Hypertension: Presented with elevated BP. SBP now in the 110s to 130s.  Will need to watch BP closely in the setting of fluid restriction and diuresis.             -- Continue amlodipine 5 mg             -- Started Lasix 40 mg daily on 7/25 3. T2DM: CBGs in the 110s to 180s.  Management per primary team 4.  Hypothyroidism: Continue Synthroid 50 mcg daily 5.  History of endometrial cancer status post total hysterectomy in  December 2021   Subjective:   Feeling better, still intermittent h/a's but better. Denies nausea, fever, chills, dyspnea but has dysuria. Good appetite; no events overnight. States she had a banana and syrup on pancakes when u/a was taken.   Objective:   BP 110/65 (BP Location: Left Arm)   Pulse 62   Temp 97.8 F (36.6 C) (Oral)   Resp 18   Ht '5\' 4"'$  (1.626 m)   Wt 82.7 kg   SpO2 97%   BMI 31.30 kg/m   Intake/Output Summary (Last 24 hours) at 05/28/2021 0655 Last data filed at 05/28/2021 0348 Gross per 24 hour  Intake 120 ml  Output 1480 ml  Net -1360 ml   Weight change:   Physical Exam: General: Pleasant, well-appearing elderly woman laying in bed. No acute distress. Head: Normocephalic. Atraumatic. CV: RRR. No murmurs, rubs, or gallops.  Trace BLE pitting edema Pulmonary: Lungs CTAB. Normal effort. No wheezing or rales. Abdominal: Soft, nontender, nondistended. Normal bowel sounds. Extremities: Palpable radial and DP pulses. Normal ROM. Skin: Warm and dry. No obvious rash or lesions. Neuro: A&Ox3. Moves all extremities. Normal sensation. No focal deficit. Psych: Normal mood and affect  Imaging: No results found.  Labs: BMET Recent Labs  Lab 05/25/21 0602 05/25/21 1344 05/25/21 2243 05/26/21 0250 05/26/21 1625 05/27/21 0410 05/27/21 1734 05/28/21  0101  NA 126* 128* 128* 130* 128* 127* 130* 127*  K 4.2 4.3  --  4.0 4.9 4.2 4.2 3.7  CL 96* 96*  --  95* 94* 95* 95* 93*  CO2 23 22  --  '26 22 23 26 26  '$ GLUCOSE 119* 161*  --  107* 115* 115* 115* 104*  BUN 18 21  --  22 29* 30* 26* 25*  CREATININE 0.96 1.27*  --  1.15* 1.28* 1.08* 1.20* 1.19*  CALCIUM 8.8* 9.2  --  9.0 9.0 9.1 9.1 8.7*   CBC Recent Labs  Lab 05/23/21 1503 05/24/21 0630  WBC 7.3 7.6  NEUTROABS 4.7  --   HGB 14.5 14.4  HCT 42.9 41.8  MCV 87.2 86.0  PLT 223 217    Medications:     amLODipine  5 mg Oral Daily   aspirin  81 mg Oral Daily   enoxaparin (LOVENOX) injection  40 mg  Subcutaneous Q24H   furosemide  40 mg Oral Daily   insulin aspart  0-9 Units Subcutaneous TID WC   levothyroxine  50 mcg Oral Q0600   pantoprazole  40 mg Oral Daily   rosuvastatin  5 mg Oral Daily   sodium chloride  1 g Oral TID WC      Otelia Santee, MD 05/28/2021, 6:55 AM

## 2021-05-28 NOTE — Discharge Summary (Signed)
Physician Discharge Summary  EMBYR SIM [id number] DOB: [birth year] DOA: 05/23/2021  PCP: Shirline Frees, MD  Admit date: 05/23/2021 Discharge date: 05/28/2021  Admitted From: Home Disposition: Home  Recommendations for Outpatient Follow-up:  Follow up with PCP in 1-2 weeks Please obtain BMP/CBC in one week   Home Health: No Equipment/Devices:No  Discharge Condition:Stable CODE STATUS:FULL Diet recommendation: Regular  Brief/Interim Summary:  [name] is a 75 y.o. female with a history of endometrial cancer s/p hysterectomy, hypertension, diabetes mellitus type 2. Patient presented secondary to abnormal serum sodium of 127 and found to have a sodium of 126. Associated cramping in bilateral thighs.     Hyponatremia - patient with known history of chronic SIADH, presents with acute drop in her sodium to 126, with some symptom including cramps, weakness, she was recently treated with UTI x2. -Nephrology were consulted, patient felt to be euvolemic, she was started on Lasix, her diet was liberalized to regular, and she was started on salt tablets, overall her sodium level Fluctuating around the same range, he appears to be asymptomatic today denies any complaints, her sodium is 127, I have discussed with renal, she can be discharged on short course of salt tablets, and low-dose Lasix , so she will be discharged on Lasix 20 mg oral twice daily, with salt tablet for next 5 days, was instructed to continue with regular diet as well.  . - Normal TSH and cortisol.    AKI Seems to have stabilized.  Creatinine is 1.19 on discharge   Primary hypertension -Blood pressure overall has been acceptable, so she will be continued on losartan given her history of diabetes, I will hold amlodipine as she is been started on low-dose Lasix.   Diabetes mellitus, type 2 -CBG has been controlled during hospital stay.   Hypothyroidism -Continue Synthroid   GERD -Continue Protonix    Epigastric pain Patient with Coronary CT from last year with no evidence of CAD. Her pain is likely related to her known gastritis/GI issues. Patient was meant to have an upper endoscopy in December of 2021 but that did not happen. -Continue Protonix as mentioned above -Outpatient GI follow-up   History of endometrial cancer No known metastatic disease. Patient follows with gynecology oncology and is s/p total hysterectomy with bilateral salpingo oophorectomy   Symptomatic bradycardia S/p pacemaker. Follows with Dr. [name], EP.     Discharge Diagnoses:  Principal Problem:   Hyponatremia Active Problems:   Essential hypertension, benign   Diabetes [facility])    Discharge Instructions  Discharge Instructions     Increase activity slowly   Complete by: As directed       Allergies as of 05/28/2021       Reactions   Amoxicillin Rash   Other reaction(s): rash   Sulfamethoxazole-trimethoprim Other (See Comments)   Decreased sodium significantly Other reaction(s): low sodium   Ciprofloxacin Other (See Comments)   Muscle aches/pains   Atorvastatin Other (See Comments)   CPK Other reaction(s): increase CPK   Clarithromycin Nausea And Vomiting   Other reaction(s): vomiting   Ezetimibe Other (See Comments)   CPK Other reaction(s): increase CPK   Fenofibrate Other (See Comments)   CPK Other reaction(s): increase CPK   Hydrochlorothiazide Other (See Comments)   Decreased Potassium and decreased Sodium Other reaction(s): hyponatremia, hypokalemia   Niaspan [niacin Er] Other (See Comments)   CPK   Welchol [colesevelam Hcl] Other (See Comments)   CPK   Zoster Vaccine Live    Doesn't recall the  reaction Other reaction(s): unknown   Clindamycin/lincomycin Rash   Keflex [cephalexin] Rash        Medication List     STOP taking these medications    amLODipine 5 MG tablet Commonly known as: NORVASC   nitrofurantoin (macrocrystal-monohydrate) 100 MG capsule Commonly  known as: MACROBID       TAKE these medications    aspirin 81 MG tablet Take 81 mg by mouth daily.   Cranberry 500 MG Chew Chew 1,500 mg by mouth daily.   esomeprazole 40 MG capsule Commonly known as: NEXIUM Take 40 mg by mouth 2 (two) times daily before a meal.   estradiol 0.1 MG/GM vaginal cream Commonly known as: ESTRACE VAGINAL Place 1 Applicatorful vaginally 3 (three) times a week.   fluticasone 50 MCG/ACT nasal spray Commonly known as: FLONASE Place 2 sprays into both nostrils daily as needed for allergies or rhinitis.   furosemide 20 MG tablet Commonly known as: Lasix Take 1 tablet (20 mg total) by mouth 2 (two) times daily.   levothyroxine 50 MCG tablet Commonly known as: SYNTHROID Take 50 mcg by mouth daily before breakfast.   losartan 50 MG tablet Commonly known as: COZAAR Take 50 mg by mouth in the morning and at bedtime.   PROBIOTIC DAILY PO Take 1 capsule by mouth daily.   psyllium 58.6 % packet Commonly known as: METAMUCIL Take 1 packet by mouth at bedtime.   rosuvastatin 5 MG tablet Commonly known as: CRESTOR Take 5 mg by mouth daily.   sodium chloride 1 g tablet Take 1 tablet (1 g total) by mouth 2 (two) times daily with a meal.   Systane Hydration PF 0.4-0.3 % Soln Generic drug: Polyethyl Glyc-Propyl Glyc PF Place 1 drop into both eyes 2 (two) times daily as needed (dry eyes).   Vitamin C Chew Chew 3 tablets by mouth daily.   Vitamin D 125 MCG (5000 UT) Caps Take 5,000 Units by mouth daily.        Follow-up Information     Shirline Frees, MD Follow up in 1 week(s).   Specialty: Family Medicine Contact information: [address] [place] [address] [phone]                Allergies  Allergen Reactions   Amoxicillin Rash    Other reaction(s): rash   Sulfamethoxazole-Trimethoprim Other (See Comments)    Decreased sodium significantly  Other reaction(s): low sodium   Ciprofloxacin Other (See  Comments)    Muscle aches/pains   Atorvastatin Other (See Comments)    CPK Other reaction(s): increase CPK   Clarithromycin Nausea And Vomiting    Other reaction(s): vomiting   Ezetimibe Other (See Comments)    CPK  Other reaction(s): increase CPK   Fenofibrate Other (See Comments)    CPK Other reaction(s): increase CPK   Hydrochlorothiazide Other (See Comments)    Decreased Potassium and decreased Sodium Other reaction(s): hyponatremia, hypokalemia   Niaspan [Niacin Er] Other (See Comments)    CPK   Welchol [Colesevelam Hcl] Other (See Comments)    CPK   Zoster Vaccine Live     Doesn't recall the reaction Other reaction(s): unknown   Clindamycin/Lincomycin Rash   Keflex [Cephalexin] Rash    Consultations: Renal   Subjective:  No fever, no chills, no cramps, no weakness or lightheadedness  Discharge Exam: Vitals:   05/28/21 0347 05/28/21 0830  BP: 110/65 136/72  Pulse: 62 60  Resp: 18 20  Temp: 97.8 F (36.6 C)  98.2 F (36.8 C)  SpO2: 97% 95%   Vitals:   05/27/21 1932 05/28/21 0002 05/28/21 0347 05/28/21 0830  BP: 127/65 110/67 110/65 136/72  Pulse: 62 61 62 60  Resp: '15 18 18 20  '$ Temp: 98.3 F (36.8 C) 98.6 F (37 C) 97.8 F (36.6 C) 98.2 F (36.8 C)  TempSrc: Oral Oral Oral Oral  SpO2: 97% 93% 97% 95%  Weight:      Height:        General: Pt is alert, awake, not in acute distress Cardiovascular: RRR, S1/S2 +, no rubs, no gallops Respiratory: CTA bilaterally, no wheezing, no rhonchi Abdominal: Soft, NT, ND, bowel sounds + Extremities: no edema, no cyanosis    The results of significant diagnostics from this hospitalization (including imaging, microbiology, ancillary and laboratory) are listed below for reference.     Microbiology: Recent Results (from the past 240 hour(s))  Resp Panel by RT-PCR (Flu A&B, Covid) Nasopharyngeal Swab     Status: None   Collection Time: 05/24/21  3:15 AM   Specimen: Nasopharyngeal Swab; Nasopharyngeal(NP)  swabs in vial transport medium  Result Value Ref Range Status   SARS Coronavirus 2 by RT PCR NEGATIVE NEGATIVE Final    Comment: (NOTE) SARS-CoV-2 target nucleic acids are NOT DETECTED.  The SARS-CoV-2 RNA is generally detectable in upper respiratory specimens during the acute phase of infection. The lowest concentration of SARS-CoV-2 viral copies this assay can detect is 138 copies/mL. A negative result does not preclude SARS-Cov-2 infection and should not be used as the sole basis for treatment or other patient management decisions. A negative result may occur with  improper specimen collection/handling, submission of specimen other than nasopharyngeal swab, presence of viral mutation(s) within the areas targeted by this assay, and inadequate number of viral copies(<138 copies/mL). A negative result must be combined with clinical observations, patient history, and epidemiological information. The expected result is Negative.  Fact Sheet for Patients:  EntrepreneurPulse.com.au  Fact Sheet for Healthcare Providers:  IncredibleEmployment.be  This test is no t yet approved or cleared by the [place] FDA and  has been authorized for detection and/or diagnosis of SARS-CoV-2 by FDA under an Emergency Use Authorization (EUA). This EUA will remain  in effect (meaning this test can be used) for the duration of the COVID-19 declaration under Section 564(b)(1) of the Act, 21 U.S.C.section 360bbb-3(b)(1), unless the authorization is terminated  or revoked sooner.       Influenza A by PCR NEGATIVE NEGATIVE Final   Influenza B by PCR NEGATIVE NEGATIVE Final    Comment: (NOTE) The Xpert Xpress SARS-CoV-2/FLU/RSV plus assay is intended as an aid in the diagnosis of influenza from Nasopharyngeal swab specimens and should not be used as a sole basis for treatment. Nasal washings and aspirates are unacceptable for Xpert Xpress  SARS-CoV-2/FLU/RSV testing.  Fact Sheet for Patients: EntrepreneurPulse.com.au  Fact Sheet for Healthcare Providers: IncredibleEmployment.be  This test is not yet approved or cleared by the [place] FDA and has been authorized for detection and/or diagnosis of SARS-CoV-2 by FDA under an Emergency Use Authorization (EUA). This EUA will remain in effect (meaning this test can be used) for the duration of the COVID-19 declaration under Section 564(b)(1) of the Act, 21 U.S.C. section 360bbb-3(b)(1), unless the authorization is terminated or revoked.  Performed at [facility], [place] [address], [place], [place] [address]   Urine Culture     Status: Abnormal (Preliminary result)   Collection Time: 05/27/21 10:25 AM  Specimen: Urine, Clean Catch  Result Value Ref Range Status   Specimen Description URINE, CLEAN CATCH  Final   Special Requests NONE  Final   Culture (A)  Final    10,000 COLONIES/mL GRAM NEGATIVE RODS SUSCEPTIBILITIES TO FOLLOW Performed at [facility], [address], [place], [place] [address]    Report Status PENDING  Incomplete     Labs: BNP (last 3 results) Recent Labs    05/23/21 1503  BNP [id number]   Basic Metabolic Panel: Recent Labs  Lab 05/23/21 1506 05/24/21 0630 05/26/21 0250 05/26/21 1625 05/27/21 0410 05/27/21 1734 05/28/21 0101  NA 126*   < > 130* 128* 127* 130* 127*  K 4.7   < > 4.0 4.9 4.2 4.2 3.7  CL 95*   < > 95* 94* 95* 95* 93*  CO2 22   < > '26 22 23 26 26  '$ GLUCOSE 201*   < > 107* 115* 115* 115* 104*  BUN 19   < > 22 29* 30* 26* 25*  CREATININE 1.04*   < > 1.15* 1.28* 1.08* 1.20* 1.19*  CALCIUM 9.2   < > 9.0 9.0 9.1 9.1 8.7*  MG 2.0  --   --   --   --   --   --    < > = values in this interval not displayed.   Liver Function Tests: Recent Labs  Lab 05/23/21 1506  AST 18  ALT 16  ALKPHOS 76  BILITOT 0.6  PROT 6.6  ALBUMIN 4.1   No results for input(s): LIPASE, AMYLASE in  the last 168 hours. No results for input(s): AMMONIA in the last 168 hours. CBC: Recent Labs  Lab 05/23/21 1503 05/24/21 0630  WBC 7.3 7.6  NEUTROABS 4.7  --   HGB 14.5 14.4  HCT 42.9 41.8  MCV 87.2 86.0  PLT 223 217   Cardiac Enzymes: No results for input(s): CKTOTAL, CKMB, CKMBINDEX, TROPONINI in the last 168 hours. BNP: Invalid input(s): POCBNP CBG: Recent Labs  Lab 05/27/21 0825 05/27/21 1239 05/27/21 1650 05/27/21 2047 05/28/21 0835  GLUCAP 137* 134* 130* 156* 148*   D-Dimer No results for input(s): DDIMER in the last 72 hours. Hgb A1c No results for input(s): HGBA1C in the last 72 hours. Lipid Profile No results for input(s): CHOL, HDL, LDLCALC, TRIG, CHOLHDL, LDLDIRECT in the last 72 hours. Thyroid function studies No results for input(s): TSH, T4TOTAL, T3FREE, THYROIDAB in the last 72 hours.  Invalid input(s): FREET3 Anemia work up No results for input(s): VITAMINB12, FOLATE, FERRITIN, TIBC, IRON, RETICCTPCT in the last 72 hours. Urinalysis    Component Value Date/Time   COLORURINE YELLOW 05/27/2021 0723   APPEARANCEUR HAZY (A) 05/27/2021 0723   LABSPEC 1.018 05/27/2021 0723   PHURINE 5.0 05/27/2021 0723   GLUCOSEU >=500 (A) 05/27/2021 0723   HGBUR NEGATIVE 05/27/2021 0723   BILIRUBINUR NEGATIVE 05/27/2021 0723   KETONESUR NEGATIVE 05/27/2021 0723   PROTEINUR NEGATIVE 05/27/2021 0723   UROBILINOGEN 0.2 10/13/2019 1554   NITRITE NEGATIVE 05/27/2021 0723   LEUKOCYTESUR TRACE (A) 05/27/2021 0723   Sepsis Labs Invalid input(s): PROCALCITONIN,  WBC,  LACTICIDVEN Microbiology Recent Results (from the past 240 hour(s))  Resp Panel by RT-PCR (Flu A&B, Covid) Nasopharyngeal Swab     Status: None   Collection Time: 05/24/21  3:15 AM   Specimen: Nasopharyngeal Swab; Nasopharyngeal(NP) swabs in vial transport medium  Result Value Ref Range Status   SARS Coronavirus 2 by RT PCR NEGATIVE NEGATIVE Final  Comment: (NOTE) SARS-CoV-2 target nucleic acids are  NOT DETECTED.  The SARS-CoV-2 RNA is generally detectable in upper respiratory specimens during the acute phase of infection. The lowest concentration of SARS-CoV-2 viral copies this assay can detect is 138 copies/mL. A negative result does not preclude SARS-Cov-2 infection and should not be used as the sole basis for treatment or other patient management decisions. A negative result may occur with  improper specimen collection/handling, submission of specimen other than nasopharyngeal swab, presence of viral mutation(s) within the areas targeted by this assay, and inadequate number of viral copies(<138 copies/mL). A negative result must be combined with clinical observations, patient history, and epidemiological information. The expected result is Negative.  Fact Sheet for Patients:  EntrepreneurPulse.com.au  Fact Sheet for Healthcare Providers:  IncredibleEmployment.be  This test is no t yet approved or cleared by the [place] FDA and  has been authorized for detection and/or diagnosis of SARS-CoV-2 by FDA under an Emergency Use Authorization (EUA). This EUA will remain  in effect (meaning this test can be used) for the duration of the COVID-19 declaration under Section 564(b)(1) of the Act, 21 U.S.C.section 360bbb-3(b)(1), unless the authorization is terminated  or revoked sooner.       Influenza A by PCR NEGATIVE NEGATIVE Final   Influenza B by PCR NEGATIVE NEGATIVE Final    Comment: (NOTE) The Xpert Xpress SARS-CoV-2/FLU/RSV plus assay is intended as an aid in the diagnosis of influenza from Nasopharyngeal swab specimens and should not be used as a sole basis for treatment. Nasal washings and aspirates are unacceptable for Xpert Xpress SARS-CoV-2/FLU/RSV testing.  Fact Sheet for Patients: EntrepreneurPulse.com.au  Fact Sheet for Healthcare Providers: IncredibleEmployment.be  This test is not  yet approved or cleared by the [place] FDA and has been authorized for detection and/or diagnosis of SARS-CoV-2 by FDA under an Emergency Use Authorization (EUA). This EUA will remain in effect (meaning this test can be used) for the duration of the COVID-19 declaration under Section 564(b)(1) of the Act, 21 U.S.C. section 360bbb-3(b)(1), unless the authorization is terminated or revoked.  Performed at [facility], [place] [address] [place], [place] [address]   Urine Culture     Status: Abnormal (Preliminary result)   Collection Time: 05/27/21 10:25 AM   Specimen: Urine, Clean Catch  Result Value Ref Range Status   Specimen Description URINE, CLEAN CATCH  Final   Special Requests NONE  Final   Culture (A)  Final    10,000 COLONIES/mL GRAM NEGATIVE RODS SUSCEPTIBILITIES TO FOLLOW Performed at [facility], [place] [address], [place], [place] [address]    Report Status PENDING  Incomplete     Time coordinating discharge: Over 30 minutes  SIGNED:   Phillips Climes, MD  [facility] 05/28/2021, 11:50 AM Pager   If 7PM-7AM, please contact night-coverage [email or web address] Password [email or web address]

## 2021-05-28 NOTE — Progress Notes (Signed)
Pt given discharge instructions, prescriptions, and care notes. Pt verbalized understanding AEB no further questions or concerns at this time. IV was discontinued, no redness, pain, or swelling noted at this time. Telemetry discontinued and Centralized Telemetry was notified. Pt left the floor via wheelchair with staff in stable condition. 

## 2021-05-29 LAB — URINE CULTURE: Culture: 10000 — AB

## 2021-06-04 DIAGNOSIS — M542 Cervicalgia: Secondary | ICD-10-CM | POA: Diagnosis not present

## 2021-06-04 DIAGNOSIS — N179 Acute kidney failure, unspecified: Secondary | ICD-10-CM | POA: Diagnosis not present

## 2021-06-04 DIAGNOSIS — Z9289 Personal history of other medical treatment: Secondary | ICD-10-CM | POA: Diagnosis not present

## 2021-06-04 DIAGNOSIS — I10 Essential (primary) hypertension: Secondary | ICD-10-CM | POA: Diagnosis not present

## 2021-06-04 DIAGNOSIS — E871 Hypo-osmolality and hyponatremia: Secondary | ICD-10-CM | POA: Diagnosis not present

## 2021-06-04 DIAGNOSIS — E1165 Type 2 diabetes mellitus with hyperglycemia: Secondary | ICD-10-CM | POA: Diagnosis not present

## 2021-06-04 DIAGNOSIS — R1013 Epigastric pain: Secondary | ICD-10-CM | POA: Diagnosis not present

## 2021-06-15 NOTE — Progress Notes (Deleted)
Electrophysiology Office Note Date: 06/15/2021  ID:  [name], DOB 1946-01-05, MRN [id number]  PCP: Shirline Frees, MD Primary Cardiologist: Candee Furbish, MD Electrophysiologist: [name], MD   CC: Pacemaker follow-up  [name] is a 75 y.o. female seen today for [name], MD for routine electrophysiology followup.  Since last being seen in our clinic the patient reports doing ***.  she denies chest pain, palpitations, dyspnea, PND, orthopnea, nausea, vomiting, dizziness, syncope, edema, weight gain, or early satiety.  Device History: St. 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  Past Medical History:  Diagnosis Date   Acute meniscal tear of knee    Anxiety    Basal cell carcinoma    endometrial  cancer   Colon polyp    Diabetes ([place])    type 2   Diverticulosis    Edema    Esophageal reflux    Fibrocystic breast    Hemorrhoids    HTN (hypertension)    Hyperlipidemia    Hyponatremia    Hypothyroidism    OA (osteoarthritis) of hip    Presence of permanent cardiac pacemaker    due to bradycardia  03-2020   Seasonal allergies    Sinus bradycardia    Syncope    vagal,  in the setting of GI cramping   Past Surgical History:  Procedure Laterality Date   CHOLECYSTECTOMY     KNEE ARTHROSCOPY     meniscal tear   right   PACEMAKER IMPLANT N/A 03/06/2020   Procedure: PACEMAKER IMPLANT;  Surgeon: [name], MD;  Location: [facility];  Service: Cardiovascular;  Laterality: N/A;   ROBOTIC ASSISTED TOTAL HYSTERECTOMY WITH BILATERAL SALPINGO OOPHERECTOMY N/A 10/07/2020   Procedure: XI ROBOTIC ASSISTED TOTAL HYSTERECTOMY WITH BILATERAL SALPINGO OOPHORECTOMY;  Surgeon: Everitt Amber, MD;  Location: [facility];  Service: Gynecology;  Laterality: N/A;   SENTINEL NODE BIOPSY N/A 10/07/2020   Procedure: SENTINEL NODE BIOPSY;  Surgeon: Everitt Amber, MD;  Location: [facility];  Service: Gynecology;  Laterality: N/A;   TONSILLECTOMY     TUBAL  LIGATION      Current Outpatient Medications  Medication Sig Dispense Refill   aspirin 81 MG tablet Take 81 mg by mouth daily.      Bioflavonoid Products (VITAMIN C) CHEW Chew 3 tablets by mouth daily.      Cholecalciferol (VITAMIN D) 125 MCG (5000 UT) CAPS Take 5,000 Units by mouth daily.      Cranberry 500 MG CHEW Chew 1,500 mg by mouth daily.     esomeprazole (NEXIUM) 40 MG capsule Take 40 mg by mouth 2 (two) times daily before a meal.      estradiol (ESTRACE VAGINAL) 0.1 MG/GM vaginal cream Place 1 Applicatorful vaginally 3 (three) times a week. 42.5 g 12   fluticasone (FLONASE) 50 MCG/ACT nasal spray Place 2 sprays into both nostrils daily as needed for allergies or rhinitis.      furosemide (LASIX) 20 MG tablet Take 1 tablet (20 mg total) by mouth 2 (two) times daily. 60 tablet 0   levothyroxine (SYNTHROID, LEVOTHROID) 50 MCG tablet Take 50 mcg by mouth daily before breakfast.     losartan (COZAAR) 50 MG tablet Take 50 mg by mouth in the morning and at bedtime.      Polyethyl Glyc-Propyl Glyc PF (SYSTANE HYDRATION PF) 0.4-0.3 % SOLN Place 1 drop into both eyes 2 (two) times daily as needed (dry eyes).     Probiotic Product (PROBIOTIC DAILY  PO) Take 1 capsule by mouth daily.     psyllium (METAMUCIL) 58.6 % packet Take 1 packet by mouth at bedtime.     rosuvastatin (CRESTOR) 5 MG tablet Take 5 mg by mouth daily.     sodium chloride 1 g tablet Take 1 tablet (1 g total) by mouth 2 (two) times daily with a meal. 10 tablet 0   No current facility-administered medications for this visit.    Allergies:   Amoxicillin, Sulfamethoxazole-trimethoprim, Ciprofloxacin, Atorvastatin, Clarithromycin, Ezetimibe, Fenofibrate, Hydrochlorothiazide, Niaspan [niacin er], Welchol [colesevelam hcl], Zoster vaccine live, Clindamycin/lincomycin, and Keflex [cephalexin]   Social History: Social History   Socioeconomic History   Marital status: Married    Spouse name: Not on file   Number of children: Not  on file   Years of education: Not on file   Highest education level: Not on file  Occupational History   Not on file  Tobacco Use   Smoking status: Never   Smokeless tobacco: Never  Vaping Use   Vaping Use: Never used  Substance and Sexual Activity   Alcohol use: Yes    Comment: 1-2 per year   Drug use: No   Sexual activity: Not Currently    Birth control/protection: Post-menopausal  Other Topics Concern   Not on file  Social History Narrative   Lives in [place] with spouse   Retired,  Previously a Materials engineer   Social Determinants of Radio broadcast assistant Strain: Not on file  Food Insecurity: Not on file  Transportation Needs: Not on file  Physical Activity: Not on file  Stress: Not on file  Social Connections: Not on file  Intimate Partner Violence: Not on file    Family History: Family History  Problem Relation Age of Onset   Colon cancer Mother    Hypertension Mother    Heart disease Mother    Coronary artery disease Mother    Lung cancer Father    Other Brother        CABG   Breast cancer Sister    Breast cancer Cousin    Breast cancer Niece    Ovarian cancer Neg Hx    Endometrial cancer Neg Hx      Review of Systems: All other systems reviewed and are otherwise negative except as noted above.  Physical Exam: There were no vitals filed for this visit.   GEN- The patient is well appearing, alert and oriented x 3 today.   HEENT: normocephalic, atraumatic; sclera clear, conjunctiva pink; hearing intact; oropharynx clear; neck supple  Lungs- Clear to ausculation bilaterally, normal work of breathing.  No wheezes, rales, rhonchi Heart- Regular rate and rhythm, no murmurs, rubs or gallops  GI- soft, non-tender, non-distended, bowel sounds present  Extremities- no clubbing or cyanosis. No edema MS- no significant deformity or atrophy Skin- warm and dry, no rash or lesion; PPM pocket well healed Psych- euthymic mood, full affect Neuro- strength  and sensation are intact  PPM Interrogation- reviewed in detail today,  See PACEART report  EKG:  EKG is not ordered today.  Recent Labs: 05/23/2021: ALT 16; B Natriuretic Peptide 21.5; Magnesium 2.0 05/24/2021: Hemoglobin 14.4; Platelets 217; TSH 3.509 05/28/2021: BUN 25; Creatinine, Ser 1.19; Potassium 3.7; Sodium 127   Wt Readings from Last 3 Encounters:  05/25/21 182 lb 5.1 oz (82.7 kg)  04/21/21 177 lb 6.4 oz (80.5 kg)  10/29/20 178 lb 12.8 oz (81.1 kg)     Other studies Reviewed: Additional studies/ records that were  reviewed today include: Previous EP office notes, Previous remote checks, Most recent labwork.   Assessment and Plan:  1. Symptomatic bradycardia s/p St. Jude PPM  Normal PPM function See Pace Art report No changes today  2.HTN Stable on current regimen  3. HLD Continue statin   Current medicines are reviewed at length with the patient today.   The patient does not have concerns regarding her medicines.  The following changes were made today:  {NONE DEFAULTED:[id number]}  Labs/ tests ordered today include: *** No orders of the defined types were placed in this encounter.    Disposition:   Follow up with EP APP in 12 Months    Signed, Annamaria Helling  06/15/2021 8:33 AM  [facility] [place] [address] [phone] (office) [phone] (fax)

## 2021-06-16 DIAGNOSIS — R109 Unspecified abdominal pain: Secondary | ICD-10-CM | POA: Diagnosis not present

## 2021-06-16 DIAGNOSIS — Z8542 Personal history of malignant neoplasm of other parts of uterus: Secondary | ICD-10-CM | POA: Diagnosis not present

## 2021-06-16 DIAGNOSIS — R600 Localized edema: Secondary | ICD-10-CM | POA: Diagnosis not present

## 2021-06-16 DIAGNOSIS — E1165 Type 2 diabetes mellitus with hyperglycemia: Secondary | ICD-10-CM | POA: Diagnosis not present

## 2021-06-16 DIAGNOSIS — E871 Hypo-osmolality and hyponatremia: Secondary | ICD-10-CM | POA: Diagnosis not present

## 2021-06-16 DIAGNOSIS — E2839 Other primary ovarian failure: Secondary | ICD-10-CM | POA: Diagnosis not present

## 2021-06-16 DIAGNOSIS — I10 Essential (primary) hypertension: Secondary | ICD-10-CM | POA: Diagnosis not present

## 2021-06-16 DIAGNOSIS — K219 Gastro-esophageal reflux disease without esophagitis: Secondary | ICD-10-CM | POA: Diagnosis not present

## 2021-06-16 DIAGNOSIS — M542 Cervicalgia: Secondary | ICD-10-CM | POA: Diagnosis not present

## 2021-06-16 DIAGNOSIS — E7800 Pure hypercholesterolemia, unspecified: Secondary | ICD-10-CM | POA: Diagnosis not present

## 2021-06-16 DIAGNOSIS — E039 Hypothyroidism, unspecified: Secondary | ICD-10-CM | POA: Diagnosis not present

## 2021-06-17 ENCOUNTER — Encounter: Payer: Medicare Other | Admitting: Student

## 2021-06-22 NOTE — Progress Notes (Signed)
Electrophysiology Office Note Date: 06/23/2021  ID:  Sereen, Schnebly 1946-04-20, MRN [id number]  PCP: Shirline Frees, MD Primary Cardiologist: Candee Furbish, MD Electrophysiologist: [name], MD   CC: Pacemaker follow-up  [name] is a 75 y.o. female seen today for [name], MD for routine electrophysiology followup.  Patient was admitted 7/23 - 7/28 with cramping in BLE (thighs) and found to have sodium of 126 with chronic history of SIADH. She had AKI that resolved with conservative treatment.    Since discharge from hospital the patient reports doing well overall. Still working on getting back to previous functional status after being stuck in the hospital. Denies undue SOB. No exertional chest pain. No dizziness,lightheadedness or syncope.   Device History: St. 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  Past Medical History:  Diagnosis Date   Acute meniscal tear of knee    Anxiety    Basal cell carcinoma    endometrial  cancer   Colon polyp    Diabetes ([place])    type 2   Diverticulosis    Edema    Esophageal reflux    Fibrocystic breast    Hemorrhoids    HTN (hypertension)    Hyperlipidemia    Hyponatremia    Hypothyroidism    OA (osteoarthritis) of hip    Presence of permanent cardiac pacemaker    due to bradycardia  03-2020   Seasonal allergies    Sinus bradycardia    Syncope    vagal,  in the setting of GI cramping   Past Surgical History:  Procedure Laterality Date   CHOLECYSTECTOMY     KNEE ARTHROSCOPY     meniscal tear   right   PACEMAKER IMPLANT N/A 03/06/2020   Procedure: PACEMAKER IMPLANT;  Surgeon: [name], MD;  Location: [facility];  Service: Cardiovascular;  Laterality: N/A;   ROBOTIC ASSISTED TOTAL HYSTERECTOMY WITH BILATERAL SALPINGO OOPHERECTOMY N/A 10/07/2020   Procedure: XI ROBOTIC ASSISTED TOTAL HYSTERECTOMY WITH BILATERAL SALPINGO OOPHORECTOMY;  Surgeon: Everitt Amber, MD;  Location: [facility];  Service: Gynecology;  Laterality: N/A;   SENTINEL NODE BIOPSY N/A 10/07/2020   Procedure: SENTINEL NODE BIOPSY;  Surgeon: Everitt Amber, MD;  Location: [facility];  Service: Gynecology;  Laterality: N/A;   TONSILLECTOMY     TUBAL LIGATION      Current Outpatient Medications  Medication Sig Dispense Refill   aspirin 81 MG tablet Take 81 mg by mouth daily.      Bioflavonoid Products (VITAMIN C) CHEW Chew 3 tablets by mouth daily.      Cholecalciferol (VITAMIN D) 125 MCG (5000 UT) CAPS Take 5,000 Units by mouth daily.      Cranberry 500 MG CHEW Chew 1,500 mg by mouth daily.     esomeprazole (NEXIUM) 40 MG capsule Take 40 mg by mouth 2 (two) times daily before a meal.      estradiol (ESTRACE VAGINAL) 0.1 MG/GM vaginal cream Place 1 Applicatorful vaginally 3 (three) times a week. 42.5 g 12   fluticasone (FLONASE) 50 MCG/ACT nasal spray Place 2 sprays into both nostrils daily as needed for allergies or rhinitis.      furosemide (LASIX) 20 MG tablet Take 1 tablet (20 mg total) by mouth 2 (two) times daily. 60 tablet 0   levothyroxine (SYNTHROID, LEVOTHROID) 50 MCG tablet Take 50 mcg by mouth daily before breakfast.     losartan (COZAAR) 50 MG tablet Take 50 mg by mouth in the morning  and at bedtime.      metFORMIN (GLUCOPHAGE) 500 MG tablet Take 500 mg by mouth daily.     Polyethyl Glyc-Propyl Glyc PF (SYSTANE HYDRATION PF) 0.4-0.3 % SOLN Place 1 drop into both eyes 2 (two) times daily as needed (dry eyes).     Probiotic Product (PROBIOTIC DAILY PO) Take 1 capsule by mouth daily.     psyllium (METAMUCIL) 58.6 % packet Take 1 packet by mouth at bedtime.     rosuvastatin (CRESTOR) 5 MG tablet Take 5 mg by mouth daily.     sodium chloride 1 g tablet Take 1 tablet (1 g total) by mouth 2 (two) times daily with a meal. 10 tablet 0   No current facility-administered medications for this visit.    Allergies:   Amoxicillin, Sulfamethoxazole-trimethoprim, Ciprofloxacin, Atorvastatin, Clarithromycin,  Ezetimibe, Fenofibrate, Hydrochlorothiazide, Niaspan [niacin er], Welchol [colesevelam hcl], Zoster vaccine live, Clindamycin/lincomycin, and Keflex [cephalexin]   Social History: Social History   Socioeconomic History   Marital status: Married    Spouse name: Not on file   Number of children: Not on file   Years of education: Not on file   Highest education level: Not on file  Occupational History   Not on file  Tobacco Use   Smoking status: Never   Smokeless tobacco: Never  Vaping Use   Vaping Use: Never used  Substance and Sexual Activity   Alcohol use: Yes    Comment: 1-2 per year   Drug use: No   Sexual activity: Not Currently    Birth control/protection: Post-menopausal  Other Topics Concern   Not on file  Social History Narrative   Lives in [place] with spouse   Retired,  Previously a Materials engineer   Social Determinants of Radio broadcast assistant Strain: Not on file  Food Insecurity: Not on file  Transportation Needs: Not on file  Physical Activity: Not on file  Stress: Not on file  Social Connections: Not on file  Intimate Partner Violence: Not on file    Family History: Family History  Problem Relation Age of Onset   Colon cancer Mother    Hypertension Mother    Heart disease Mother    Coronary artery disease Mother    Lung cancer Father    Other Brother        CABG   Breast cancer Sister    Breast cancer Cousin    Breast cancer Niece    Ovarian cancer Neg Hx    Endometrial cancer Neg Hx      Review of Systems: All other systems reviewed and are otherwise negative except as noted above.  Physical Exam: Vitals:   06/23/21 1004  BP: 138/72  Pulse: 60  SpO2: 97%  Weight: 176 lb (79.8 kg)  Height: '5\' 5"'$  (1.651 m)     GEN- The patient is well appearing, alert and oriented x 3 today.   HEENT: normocephalic, atraumatic; sclera clear, conjunctiva pink; hearing intact; oropharynx clear; neck supple  Lungs- Clear to ausculation bilaterally,  normal work of breathing.  No wheezes, rales, rhonchi Heart- Regular rate and rhythm, no murmurs, rubs or gallops  GI- soft, non-tender, non-distended, bowel sounds present  Extremities- no clubbing or cyanosis. No edema MS- no significant deformity or atrophy Skin- warm and dry, no rash or lesion; PPM pocket well healed Psych- euthymic mood, full affect Neuro- strength and sensation are intact  PPM Interrogation- reviewed in detail today,  See PACEART report  EKG:  EKG is not  ordered today. Personal review of ekg ordered  05/25/21  shows A paced VS at 61 bpm   Recent Labs: 05/23/2021: ALT 16; B Natriuretic Peptide 21.5; Magnesium 2.0 05/24/2021: Hemoglobin 14.4; Platelets 217; TSH 3.509 05/28/2021: BUN 25; Creatinine, Ser 1.19; Potassium 3.7; Sodium 127   Wt Readings from Last 3 Encounters:  06/23/21 176 lb (79.8 kg)  05/25/21 182 lb 5.1 oz (82.7 kg)  04/21/21 177 lb 6.4 oz (80.5 kg)     Other studies Reviewed: Additional studies/ records that were reviewed today include: Previous EP office notes, Previous remote checks, Most recent labwork.   Assessment and Plan:  1. Symptomatic bradycardia s/p St. Jude PPM  Normal PPM function See Pace Art report No changes today  2. HTN Stable on current regimen   3. HLD Continue statin  Current medicines are reviewed at length with the patient today.   The patient does not have concerns regarding her medicines.  The following changes were made today:  None  Labs/ tests ordered today include:  She had Labs drawn at PCP last week.  Disposition:   Follow up with EP APP in 12 Months. Sooner with issues.     Jacalyn Lefevre, PA-C  06/23/2021 10:07 AM  [facility] [address] [place] [address] [phone] (office) [phone] (fax)

## 2021-06-23 ENCOUNTER — Ambulatory Visit (INDEPENDENT_AMBULATORY_CARE_PROVIDER_SITE_OTHER): Payer: Medicare Other | Admitting: Student

## 2021-06-23 ENCOUNTER — Other Ambulatory Visit: Payer: Self-pay

## 2021-06-23 ENCOUNTER — Encounter: Payer: Self-pay | Admitting: Student

## 2021-06-23 ENCOUNTER — Other Ambulatory Visit: Payer: Self-pay | Admitting: Family Medicine

## 2021-06-23 VITALS — BP 138/72 | HR 60 | Ht 65.0 in | Wt 176.0 lb

## 2021-06-23 DIAGNOSIS — Z1231 Encounter for screening mammogram for malignant neoplasm of breast: Secondary | ICD-10-CM

## 2021-06-23 DIAGNOSIS — I10 Essential (primary) hypertension: Secondary | ICD-10-CM | POA: Diagnosis not present

## 2021-06-23 DIAGNOSIS — Z950 Presence of cardiac pacemaker: Secondary | ICD-10-CM | POA: Diagnosis not present

## 2021-06-23 DIAGNOSIS — R001 Bradycardia, unspecified: Secondary | ICD-10-CM

## 2021-06-23 DIAGNOSIS — E2839 Other primary ovarian failure: Secondary | ICD-10-CM

## 2021-06-23 LAB — CUP PACEART INCLINIC DEVICE CHECK
Battery Remaining Longevity: 109 mo
Battery Voltage: 3.02 V
Brady Statistic RA Percent Paced: 95 %
Brady Statistic RV Percent Paced: 0.17 %
Date Time Interrogation Session: 20220823104306
Implantable Lead Implant Date: 20210506
Implantable Lead Implant Date: 20210506
Implantable Lead Location: 753859
Implantable Lead Location: 753860
Implantable Pulse Generator Implant Date: 20210506
Lead Channel Impedance Value: 512.5 Ohm
Lead Channel Impedance Value: 525 Ohm
Lead Channel Pacing Threshold Amplitude: 0.5 V
Lead Channel Pacing Threshold Amplitude: 0.5 V
Lead Channel Pacing Threshold Amplitude: 0.75 V
Lead Channel Pacing Threshold Pulse Width: 0.5 ms
Lead Channel Pacing Threshold Pulse Width: 0.5 ms
Lead Channel Pacing Threshold Pulse Width: 0.5 ms
Lead Channel Sensing Intrinsic Amplitude: 12 mV
Lead Channel Sensing Intrinsic Amplitude: 2.4 mV
Lead Channel Setting Pacing Amplitude: 1 V
Lead Channel Setting Pacing Amplitude: 2 V
Lead Channel Setting Pacing Pulse Width: 0.5 ms
Lead Channel Setting Sensing Sensitivity: 2 mV
Pulse Gen Model: 2272
Pulse Gen Serial Number: 3825689

## 2021-06-23 NOTE — Patient Instructions (Signed)
Medication Instructions:  Your physician recommends that you continue on your current medications as directed. Please refer to the Current Medication list given to you today.  *If you need a refill on your cardiac medications before your next appointment, please call your pharmacy*   Lab Work: None If you have labs (blood work) drawn today and your tests are completely normal, you will receive your results only by: [place] (if you have MyChart) OR A paper copy in the mail If you have any lab test that is abnormal or we need to change your treatment, we will call you to review the results.    Follow-Up: At [facility], you and your health needs are our priority.  As part of our continuing mission to provide you with exceptional heart care, we have created designated Provider Care Teams.  These Care Teams include your primary Cardiologist (physician) and Advanced Practice Providers (APPs -  Physician Assistants and Nurse Practitioners) who all work together to provide you with the care you need, when you need it.   Your next appointment:   1 year(s)  The format for your next appointment:   In Person  Provider:   You may see [name], MD or one of the following Advanced Practice Providers on your designated Care Team:   Tommye Standard, [place] Legrand Como "[facility]" [place], [place]

## 2021-07-03 DIAGNOSIS — R109 Unspecified abdominal pain: Secondary | ICD-10-CM | POA: Diagnosis not present

## 2021-07-03 DIAGNOSIS — K219 Gastro-esophageal reflux disease without esophagitis: Secondary | ICD-10-CM | POA: Diagnosis not present

## 2021-07-09 DIAGNOSIS — R0789 Other chest pain: Secondary | ICD-10-CM | POA: Diagnosis not present

## 2021-07-09 DIAGNOSIS — K219 Gastro-esophageal reflux disease without esophagitis: Secondary | ICD-10-CM | POA: Diagnosis not present

## 2021-07-09 DIAGNOSIS — K317 Polyp of stomach and duodenum: Secondary | ICD-10-CM | POA: Diagnosis not present

## 2021-07-09 DIAGNOSIS — N3020 Other chronic cystitis without hematuria: Secondary | ICD-10-CM | POA: Diagnosis not present

## 2021-07-10 ENCOUNTER — Telehealth: Payer: Self-pay | Admitting: *Deleted

## 2021-07-10 NOTE — Telephone Encounter (Signed)
Called the patient and moved the patient from 9/21 to 9/12

## 2021-07-12 NOTE — Progress Notes (Signed)
Gynecologic Oncology Follow-up Note    Chief Complaint:  Chief Complaint  Patient presents with   Endometrial cancer, grade I [facility])   CC:  Chief Complaint  Patient presents with   Endometrial cancer, grade I ([place])    Assessment/Plan:  [name]  is a 75 y.o.  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  Pathology revealed low risk factors for recurrence, therefore no adjuvant therapy was recommended according to NCCN guidelines.  I discussed risk for recurrence and typical symptoms encouraged her to notify us of these should they develop between visits.  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  I recommend she have follow-up every 6 months for 5 years in accordance with NCCN guidelines. Those visits should include symptom assessment, physical exam and pelvic examination. Pap smears are not indicated or recommended in the routine surveillance of endometrial cancer.  HPI: [name] a 75year old P3 who was seen in consultation at the request of Dr ERip Harbourfor evaluation and treatment of grade 1 endometrial cancer.   Her symptoms began 07/21/2020 with postmenopausal bleeding.  [facility] as she did not have a gynecologist on the same day.  A physical examination was performed but no additional testing.  Therefore she was referred for outpatient work-up with a scheduled ultrasound and appointment with a gynecologist in the following months.  Her first follow-up visit with the gynecologist was scheduled for 09/01/2020. Work-up of symptoms included a transvaginal ultrasound scan and endometrial Pipelle. Transvaginal [place] 08/11/2020 showed a uterus measuring 7.4 x 2.6 x 4.4 cm with a 3 mm endometrial thickness.  The right and left ovaries were  grossly normal. Endometrial sampling with endometrial Pipelle on 09/01/2020 and showed FIGO grade 1 endometrioid adenocarcinoma.   On 10/08/20 she underwent robotic assisted total hysterectomy, BSO, SLN biopsy. Intraoperative findings were significant for 6cm normal appearing uterus, normal tubes and ovaries bilaterally, normal peritoneal cavity, no gross extra-uterine disease. Surgery was uncomplicated.  Final pathology revealed a FIGO grade 1 endometrioid endometrial adenocarcinoma, with no myometrial invasion, no LVSI, and negative nodes, cervix and adnexa. MMR in tact.  Interval Hx:  She returns for routine surveillance. She reported not symptoms concerning for recurrence.   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 She has recurrent UTI's (failed maintenance Abx, unable to tolerate vaginal premarin due to sensitivity to the cream).        Current Meds:  Outpatient Encounter Medications as of 07/13/2021  Medication Sig   aspirin 81 MG tablet Take 81 mg by mouth daily.    Bioflavonoid Products (VITAMIN C) CHEW Chew 3 tablets by mouth daily.    Cholecalciferol (VITAMIN D) 125 MCG (5000 UT) CAPS Take 5,000 Units by mouth daily.    Cranberry 500 MG CHEW Chew 1,500 mg by mouth daily.   esomeprazole (NEXIUM) 40 MG capsule Take 40 mg by mouth 2 (two) times daily before a meal.    estradiol (ESTRACE VAGINAL) 0.1 MG/GM vaginal cream Place 1 Applicatorful vaginally 3 (three) times a week.   fluticasone (FLONASE) 50 MCG/ACT nasal spray Place 2 sprays into both nostrils daily as needed for allergies or rhinitis.    furosemide (LASIX) 20 MG tablet Take 1 tablet (20 mg total) by  mouth 2 (two) times daily.   levothyroxine (SYNTHROID, LEVOTHROID) 50 MCG tablet Take 50 mcg by mouth daily before breakfast.   losartan (COZAAR) 50 MG tablet Take 50 mg by mouth in the morning and at bedtime.    metFORMIN (GLUCOPHAGE) 500 MG tablet Take 500 mg by mouth daily.   Polyethyl Glyc-Propyl  Glyc PF (SYSTANE HYDRATION PF) 0.4-0.3 % SOLN Place 1 drop into both eyes 2 (two) times daily as needed (dry eyes).   Probiotic Product (PROBIOTIC DAILY PO) Take 1 capsule by mouth daily.   psyllium (METAMUCIL) 58.6 % packet Take 1 packet by mouth at bedtime.   rosuvastatin (CRESTOR) 5 MG tablet Take 5 mg by mouth daily.   sodium chloride 1 g tablet Take 1 tablet (1 g total) by mouth 2 (two) times daily with a meal.   No facility-administered encounter medications on file as of 07/13/2021.    Allergy:  Allergies  Allergen Reactions   Amoxicillin Rash    Other reaction(s): rash   Sulfamethoxazole-Trimethoprim Other (See Comments)    Decreased sodium significantly  Other reaction(s): low sodium   Ciprofloxacin Other (See Comments)    Muscle aches/pains   Atorvastatin Other (See Comments)    CPK Other reaction(s): increase CPK   Clarithromycin Nausea And Vomiting    Other reaction(s): vomiting   Ezetimibe Other (See Comments)    CPK  Other reaction(s): increase CPK   Fenofibrate Other (See Comments)    CPK Other reaction(s): increase CPK   Hydrochlorothiazide Other (See Comments)    Decreased Potassium and decreased Sodium Other reaction(s): hyponatremia, hypokalemia   Niaspan [Niacin Er] Other (See Comments)    CPK   Welchol [Colesevelam Hcl] Other (See Comments)    CPK   Zoster Vaccine Live     Doesn't recall the reaction Other reaction(s): unknown   Clindamycin/Lincomycin Rash   Keflex [Cephalexin] Rash    Social Hx:   Social History   Socioeconomic History   Marital status: Married    Spouse name: Not on file   Number of children: Not on file   Years of education: Not on file   Highest education level: Not on file  Occupational History   Not on file  Tobacco Use   Smoking status: Never   Smokeless tobacco: Never  Vaping Use   Vaping Use: Never used  Substance and Sexual Activity   Alcohol use: Yes    Comment: 1-2 per year   Drug use: No   Sexual  activity: Not Currently    Birth control/protection: Post-menopausal  Other Topics Concern   Not on file  Social History Narrative   Lives in [place] with spouse   Retired,  Previously a Arts development officer   Social Determinants of Corporate investment banker Strain: Not on file  Food Insecurity: Not on file  Transportation Needs: Not on file  Physical Activity: Not on file  Stress: Not on file  Social Connections: Not on file  Intimate Partner Violence: Not on file    Past Surgical Hx:  Past Surgical History:  Procedure Laterality Date   CHOLECYSTECTOMY     KNEE ARTHROSCOPY     meniscal tear   right   PACEMAKER IMPLANT N/A 03/06/2020   Procedure: PACEMAKER IMPLANT;  Surgeon: Hillis Range, MD;  [facility]: [facility];  Service: Cardiovascular;  Laterality: N/A;   ROBOTIC ASSISTED TOTAL HYSTERECTOMY WITH BILATERAL SALPINGO OOPHERECTOMY N/A 10/07/2020   Procedure: XI ROBOTIC ASSISTED TOTAL HYSTERECTOMY WITH BILATERAL SALPINGO OOPHORECTOMY;  Surgeon: Everitt Amber, MD;  Location: [facility];  Service: Gynecology;  Laterality: N/A;   SENTINEL NODE BIOPSY N/A 10/07/2020   Procedure: SENTINEL NODE BIOPSY;  Surgeon: Everitt Amber, MD;  Location: [facility];  Service: Gynecology;  Laterality: N/A;   TONSILLECTOMY     TUBAL LIGATION      Past Medical Hx:  Past Medical History:  Diagnosis Date   Acute meniscal tear of knee    Anxiety    Basal cell carcinoma    endometrial  cancer   Colon polyp    Diabetes ([facility])    type 2   Diverticulosis    Edema    Esophageal reflux    Fibrocystic breast    Hemorrhoids    HTN (hypertension)    Hyperlipidemia    Hyponatremia    Hypothyroidism    OA (osteoarthritis) of hip    Presence of permanent cardiac pacemaker    due to bradycardia  03-2020   Seasonal allergies    Sinus bradycardia    Syncope    vagal,  in the setting of GI cramping    Past Gynecological History:  See HPI, SVD x 3 No LMP recorded. Patient is postmenopausal.  Family Hx:   Family History  Problem Relation Age of Onset   Colon cancer Mother    Hypertension Mother    Heart disease Mother    Coronary artery disease Mother    Lung cancer Father    Other Brother        CABG   Breast cancer Sister    Breast cancer Cousin    Breast cancer Niece    Ovarian cancer Neg Hx    Endometrial cancer Neg Hx     Review of Systems:  Constitutional  Feels well,    ENT Normal appearing ears and nares bilaterally Skin/Breast  No rash, sores, jaundice, itching, dryness Cardiovascular  No chest pain, shortness of breath, or edema  Pulmonary  No cough or wheeze.  Gastro Intestinal  No nausea, vomitting, or diarrhoea. No bright red blood per rectum, no abdominal pain, change in bowel movement, or constipation.  Genito Urinary  No frequency, urgency, dysuria, no bleeding. + chronic UTI's, difficulty becoming aroused.  Musculo Skeletal  No myalgia, arthralgia, joint swelling or pain  Neurologic  No weakness, numbness, change in gait,  Psychology  No depression, anxiety, insomnia.   Vitals:  Blood pressure (!) 154/77, pulse 60, temperature 98.4 F (36.9 C), temperature source Oral, resp. rate 18, height _0  (1.651 m), weight 175 lb 6.4 oz (79.6 kg), SpO2 98 %.  Physical Exam: WD in NAD Neck  Supple NROM, without any enlargements.  Lymph Node Survey No cervical supraclavicular or inguinal adenopathy Cardiovascular  Well perfused peripheries.  Lungs  No increased WOB Skin  No rash/lesions/breakdown  Psychiatry  Alert and oriented to person, place, and time  Abdomen  Normoactive bowel sounds, abdomen soft, non-tender and obese without evidence of hernia.  Soft incisions Back No CVA tenderness Genito Urinary  Vaginal cuff without visible masses. There is a 1-24m spherical nodule in the right vaginal fornix. Stable in size from prior exam. Favor a stitch granuloma. Too small to biopsy and non-visible on speculum exam. Significant posterior vaginal wall  prolapse and right vaginal cuff enterocele palpable.  Rectal  deferred Extremities  No bilateral cyanosis, clubbing or edema.   EThereasa Solo MD  07/13/2021, 11:26 AM

## 2021-07-13 ENCOUNTER — Encounter: Payer: Self-pay | Admitting: Gynecologic Oncology

## 2021-07-13 ENCOUNTER — Other Ambulatory Visit: Payer: Self-pay

## 2021-07-13 ENCOUNTER — Inpatient Hospital Stay: Payer: Medicare Other | Attending: Gynecologic Oncology | Admitting: Gynecologic Oncology

## 2021-07-13 VITALS — BP 154/77 | HR 60 | Temp 98.4°F | Resp 18 | Ht 65.0 in | Wt 175.4 lb

## 2021-07-13 DIAGNOSIS — C541 Malignant neoplasm of endometrium: Secondary | ICD-10-CM

## 2021-07-13 DIAGNOSIS — Z79890 Hormone replacement therapy (postmenopausal): Secondary | ICD-10-CM | POA: Insufficient documentation

## 2021-07-13 DIAGNOSIS — Z90710 Acquired absence of both cervix and uterus: Secondary | ICD-10-CM | POA: Diagnosis not present

## 2021-07-13 DIAGNOSIS — Z90722 Acquired absence of ovaries, bilateral: Secondary | ICD-10-CM | POA: Insufficient documentation

## 2021-07-13 NOTE — Patient Instructions (Signed)
Please notify Dr Denman George at phone number [phone] if you notice vaginal bleeding, new pelvic or abdominal pains, bloating, feeling full easy, or a change in bladder or bowel function.   Dr Denman George is departing the [place] at [facility] in October, 2022. Her partners and colleagues including Dr Berline Lopes, Dr Delsa Sale and Joylene John, Nurse Practitioner will be available to continue your care.   You are next scheduled to return to the Gynecologic Oncology office at the [facility] in March, 2023. Please call [phone] in January, 2023 to request an appointment for March with Dr Serita Grit partner, Dr Delsa Sale.

## 2021-07-14 DIAGNOSIS — K317 Polyp of stomach and duodenum: Secondary | ICD-10-CM | POA: Diagnosis not present

## 2021-07-22 ENCOUNTER — Ambulatory Visit: Payer: Medicare Other | Admitting: Gynecologic Oncology

## 2021-07-25 IMAGING — MG DIGITAL SCREENING BILAT W/ TOMO W/ CAD
6 of 10 series · 6 of 30 positions shown · non-contrast
Comparison: Previous exam(s).

CLINICAL DATA: Screening.

EXAM:
DIGITAL SCREENING BILATERAL MAMMOGRAM WITH TOMO AND CAD

[L CC synth-2D]
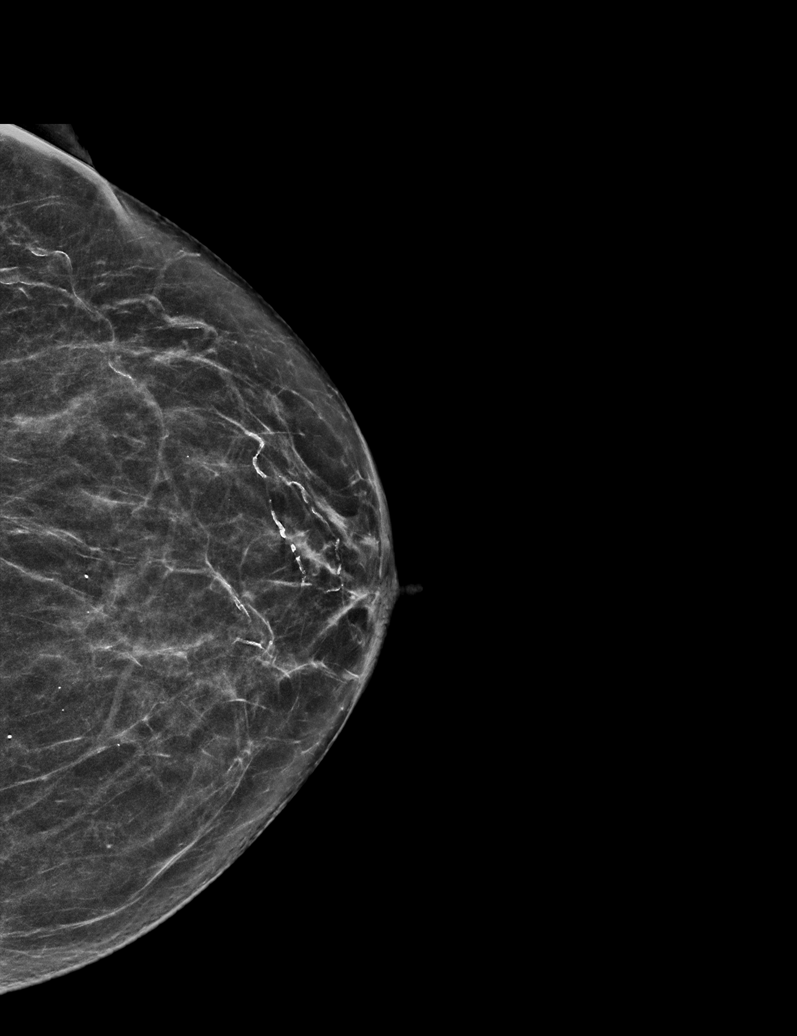

[R CC synth-2D]
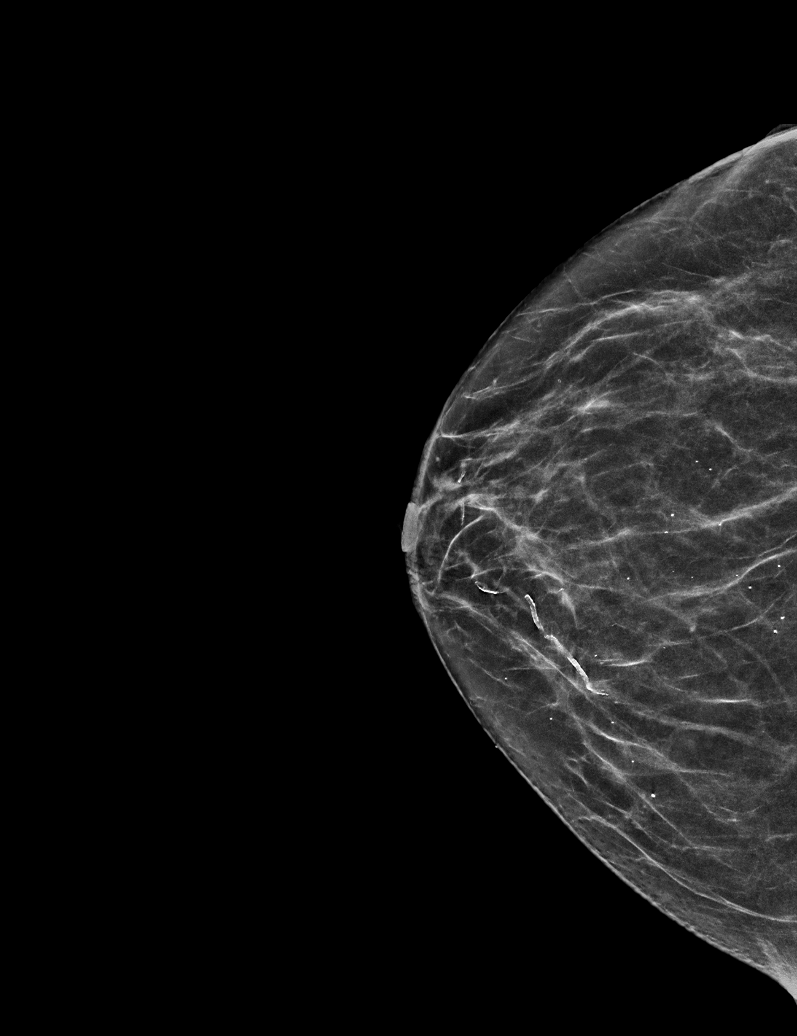

[R MLO synth-2D]
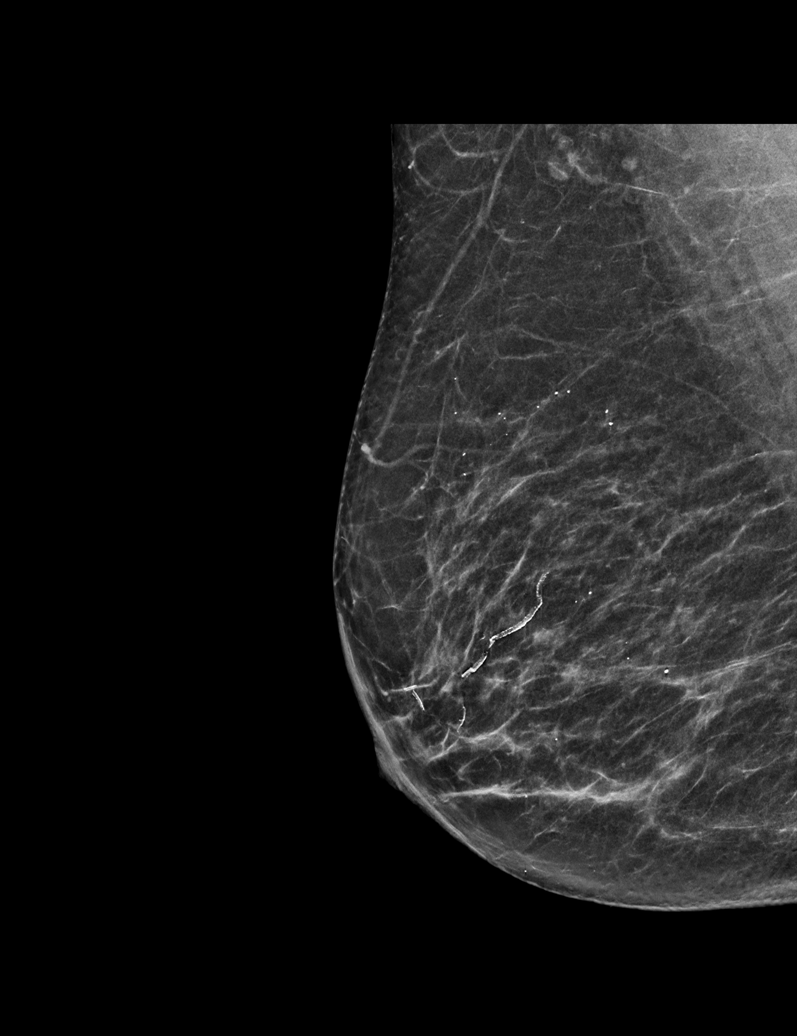

[L MLO synth-2D (1 of 2)]
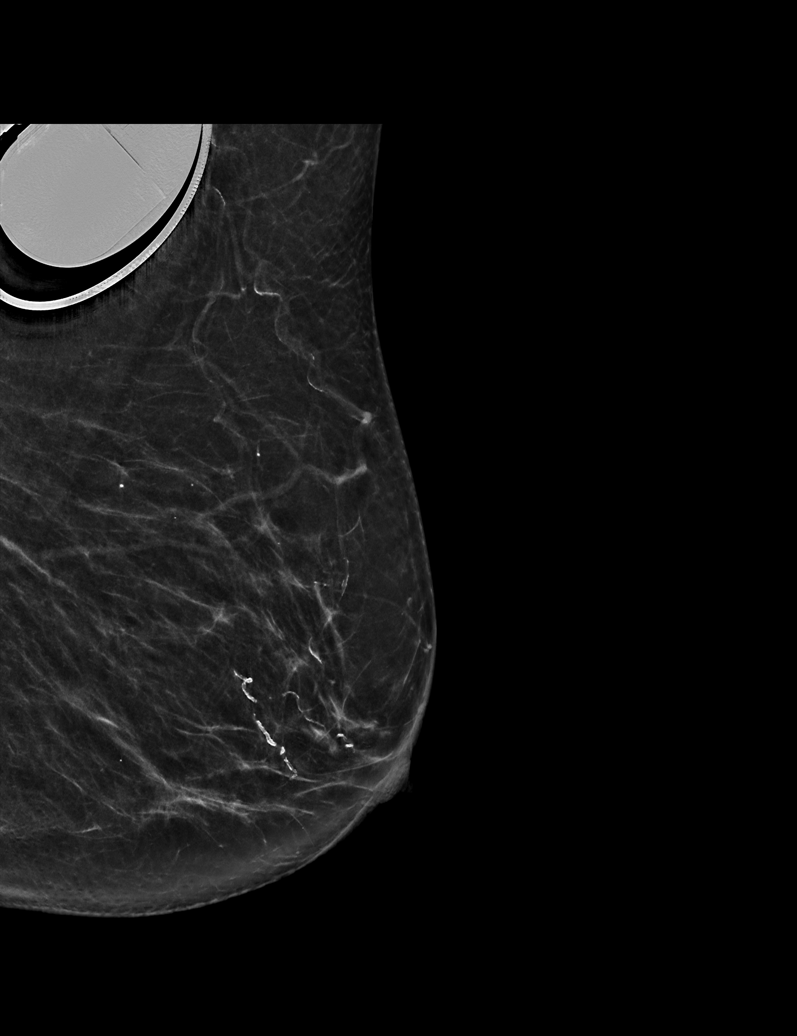

[L MLO synth-2D (2 of 2)]
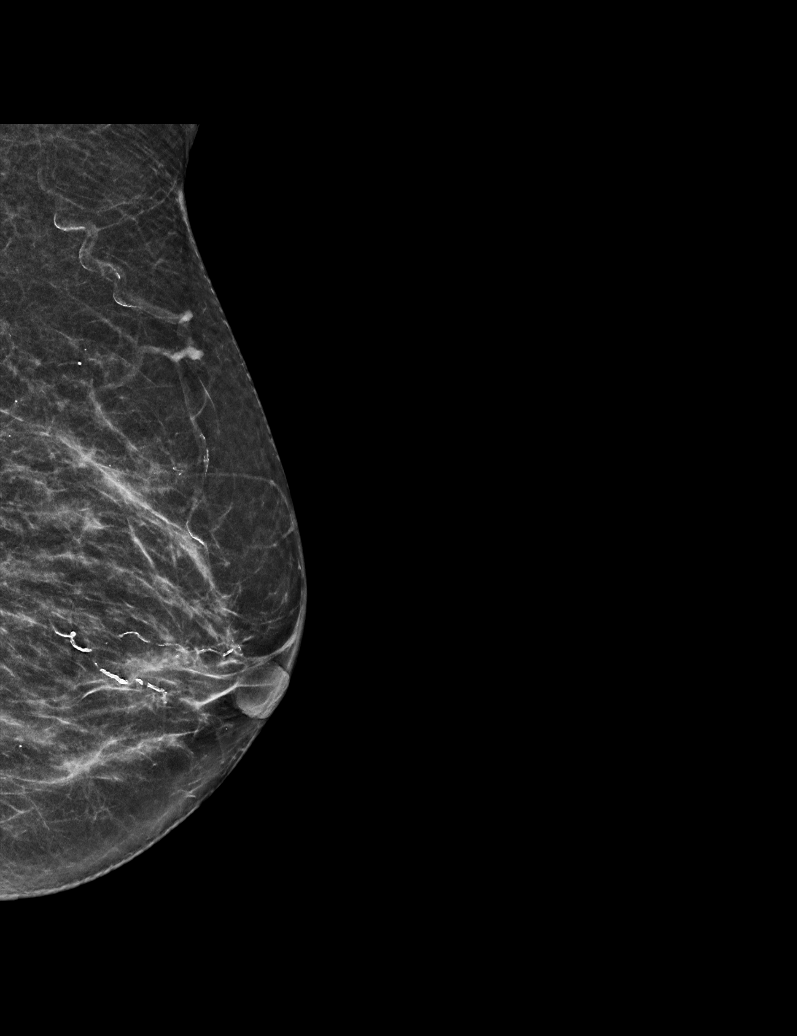

[R CC tomo · tomo slice 27/53.0]
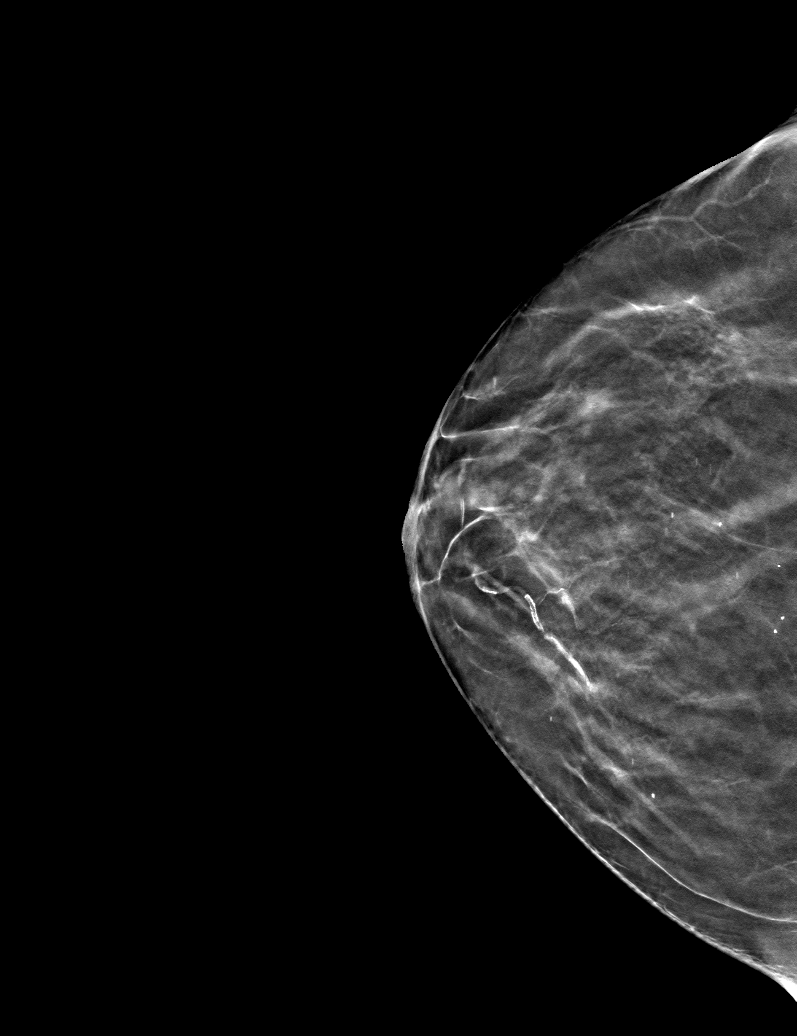

[6 of 30 positions shown; findings below may reference images not displayed]

ACR Breast Density Category b: There are scattered areas of
fibroglandular density.
FINDINGS: There are no findings suspicious for malignancy. Images were
processed with CAD.
IMPRESSION: No mammographic evidence of malignancy. A result letter of this
screening mammogram will be mailed directly to the patient.

RECOMMENDATION:
Screening mammogram in one year. (Code:[id number])

BI-RADS CATEGORY  1: Negative.

## 2021-07-30 DIAGNOSIS — E119 Type 2 diabetes mellitus without complications: Secondary | ICD-10-CM | POA: Diagnosis not present

## 2021-07-30 DIAGNOSIS — H43813 Vitreous degeneration, bilateral: Secondary | ICD-10-CM | POA: Diagnosis not present

## 2021-07-30 DIAGNOSIS — H5203 Hypermetropia, bilateral: Secondary | ICD-10-CM | POA: Diagnosis not present

## 2021-07-30 DIAGNOSIS — H2513 Age-related nuclear cataract, bilateral: Secondary | ICD-10-CM | POA: Diagnosis not present

## 2021-07-31 DIAGNOSIS — E1129 Type 2 diabetes mellitus with other diabetic kidney complication: Secondary | ICD-10-CM | POA: Diagnosis not present

## 2021-07-31 DIAGNOSIS — E871 Hypo-osmolality and hyponatremia: Secondary | ICD-10-CM | POA: Diagnosis not present

## 2021-07-31 DIAGNOSIS — I129 Hypertensive chronic kidney disease with stage 1 through stage 4 chronic kidney disease, or unspecified chronic kidney disease: Secondary | ICD-10-CM | POA: Diagnosis not present

## 2021-08-06 ENCOUNTER — Ambulatory Visit
Admission: RE | Admit: 2021-08-06 | Discharge: 2021-08-06 | Disposition: A | Discharge status: Home / Self Care | Payer: Medicare Other | Source: Ambulatory Visit | Attending: Family Medicine | Admitting: Family Medicine

## 2021-08-06 ENCOUNTER — Other Ambulatory Visit: Payer: Self-pay | Admitting: Family Medicine

## 2021-08-06 DIAGNOSIS — R519 Headache, unspecified: Secondary | ICD-10-CM

## 2021-08-06 DIAGNOSIS — I10 Essential (primary) hypertension: Secondary | ICD-10-CM | POA: Diagnosis not present

## 2021-08-26 DIAGNOSIS — Z85828 Personal history of other malignant neoplasm of skin: Secondary | ICD-10-CM | POA: Diagnosis not present

## 2021-08-26 DIAGNOSIS — Z08 Encounter for follow-up examination after completed treatment for malignant neoplasm: Secondary | ICD-10-CM | POA: Diagnosis not present

## 2021-08-26 DIAGNOSIS — L821 Other seborrheic keratosis: Secondary | ICD-10-CM | POA: Diagnosis not present

## 2021-08-26 DIAGNOSIS — D2371 Other benign neoplasm of skin of right lower limb, including hip: Secondary | ICD-10-CM | POA: Diagnosis not present

## 2021-08-26 DIAGNOSIS — L814 Other melanin hyperpigmentation: Secondary | ICD-10-CM | POA: Diagnosis not present

## 2021-08-26 DIAGNOSIS — L820 Inflamed seborrheic keratosis: Secondary | ICD-10-CM | POA: Diagnosis not present

## 2021-08-26 DIAGNOSIS — L570 Actinic keratosis: Secondary | ICD-10-CM | POA: Diagnosis not present

## 2021-08-26 DIAGNOSIS — L538 Other specified erythematous conditions: Secondary | ICD-10-CM | POA: Diagnosis not present

## 2021-08-30 DIAGNOSIS — N390 Urinary tract infection, site not specified: Secondary | ICD-10-CM | POA: Diagnosis not present

## 2021-08-30 DIAGNOSIS — R319 Hematuria, unspecified: Secondary | ICD-10-CM | POA: Diagnosis not present

## 2021-09-03 ENCOUNTER — Ambulatory Visit (INDEPENDENT_AMBULATORY_CARE_PROVIDER_SITE_OTHER): Payer: Medicare Other

## 2021-09-03 DIAGNOSIS — Z950 Presence of cardiac pacemaker: Secondary | ICD-10-CM

## 2021-09-03 LAB — CUP PACEART REMOTE DEVICE CHECK
Battery Remaining Longevity: 104 mo
Battery Remaining Percentage: 88 %
Battery Voltage: 3.02 V
Brady Statistic AP VP Percent: 1 %
Brady Statistic AP VS Percent: 96 %
Brady Statistic AS VP Percent: 1 %
Brady Statistic AS VS Percent: 4 %
Brady Statistic RA Percent Paced: 94 %
Brady Statistic RV Percent Paced: 1 %
Date Time Interrogation Session: 20221103040015
Implantable Lead Implant Date: 20210506
Implantable Lead Implant Date: 20210506
Implantable Lead Location: 753859
Implantable Lead Location: 753860
Implantable Pulse Generator Implant Date: 20210506
Lead Channel Impedance Value: 490 Ohm
Lead Channel Impedance Value: 530 Ohm
Lead Channel Pacing Threshold Amplitude: 0.5 V
Lead Channel Pacing Threshold Amplitude: 0.75 V
Lead Channel Pacing Threshold Pulse Width: 0.5 ms
Lead Channel Pacing Threshold Pulse Width: 0.5 ms
Lead Channel Sensing Intrinsic Amplitude: 12 mV
Lead Channel Sensing Intrinsic Amplitude: 2.6 mV
Lead Channel Setting Pacing Amplitude: 1 V
Lead Channel Setting Pacing Amplitude: 2 V
Lead Channel Setting Pacing Pulse Width: 0.5 ms
Lead Channel Setting Sensing Sensitivity: 2 mV
Pulse Gen Model: 2272
Pulse Gen Serial Number: 3825689

## 2021-09-09 NOTE — Progress Notes (Signed)
Remote pacemaker transmission.   

## 2021-09-18 DIAGNOSIS — E871 Hypo-osmolality and hyponatremia: Secondary | ICD-10-CM | POA: Diagnosis not present

## 2021-09-18 DIAGNOSIS — E1165 Type 2 diabetes mellitus with hyperglycemia: Secondary | ICD-10-CM | POA: Diagnosis not present

## 2021-09-18 DIAGNOSIS — E039 Hypothyroidism, unspecified: Secondary | ICD-10-CM | POA: Diagnosis not present

## 2021-09-18 DIAGNOSIS — R109 Unspecified abdominal pain: Secondary | ICD-10-CM | POA: Diagnosis not present

## 2021-09-18 DIAGNOSIS — I10 Essential (primary) hypertension: Secondary | ICD-10-CM | POA: Diagnosis not present

## 2021-09-18 DIAGNOSIS — K219 Gastro-esophageal reflux disease without esophagitis: Secondary | ICD-10-CM | POA: Diagnosis not present

## 2021-09-18 DIAGNOSIS — E7800 Pure hypercholesterolemia, unspecified: Secondary | ICD-10-CM | POA: Diagnosis not present

## 2021-10-02 ENCOUNTER — Encounter: Payer: Self-pay | Admitting: Cardiology

## 2021-10-02 ENCOUNTER — Ambulatory Visit (INDEPENDENT_AMBULATORY_CARE_PROVIDER_SITE_OTHER): Payer: Medicare Other | Admitting: Cardiology

## 2021-10-02 ENCOUNTER — Other Ambulatory Visit: Payer: Self-pay

## 2021-10-02 DIAGNOSIS — R0789 Other chest pain: Secondary | ICD-10-CM

## 2021-10-02 DIAGNOSIS — I495 Sick sinus syndrome: Secondary | ICD-10-CM | POA: Insufficient documentation

## 2021-10-02 DIAGNOSIS — E871 Hypo-osmolality and hyponatremia: Secondary | ICD-10-CM | POA: Diagnosis not present

## 2021-10-02 DIAGNOSIS — I10 Essential (primary) hypertension: Secondary | ICD-10-CM | POA: Diagnosis not present

## 2021-10-02 DIAGNOSIS — R748 Abnormal levels of other serum enzymes: Secondary | ICD-10-CM

## 2021-10-02 NOTE — Progress Notes (Signed)
Cardiology Office Note:    Date:  10/02/2021   ID:  [name], DOB 04/12/1946, MRN [id number]  PCP:  Molly Frees, MD   [facility] Providers Cardiologist:  Molly Furbish, MD Electrophysiologist:  Molly Grayer, MD     Referring MD: Molly Frees, MD    History of Present Illness:    [name] is a 75 y.o. female here for follow-up, pacemaker placement, prior history of chronic SIADH with hyponatremia.  She was in the hospital in July 2022 with cramping bilateral thighs and found to have sodium 126 with chronic history of SIADH.  Had AKI that resolved with conservative management.   Sweeping floor with pain across stomach and wraps around back and then into chest. Goes away with rest. Checked stomach.  Normal. Once again reassured her about her coronary CT scan that showed no CAD, calcium score was 0.  Device History: St. 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  She is going to [employer] with her grandson 3 years old in a few days.  She has been to [place] in the 1980s.  Past Medical History:  Diagnosis Date   Acute meniscal tear of knee    Anxiety    Basal cell carcinoma    endometrial  cancer   Colon polyp    Diabetes ([place])    type 2   Diverticulosis    Edema    Esophageal reflux    Fibrocystic breast    Hemorrhoids    HTN (hypertension)    Hyperlipidemia    Hyponatremia    Hypothyroidism    OA (osteoarthritis) of hip    Presence of permanent cardiac pacemaker    due to bradycardia  03-2020   Seasonal allergies    Sinus bradycardia    Syncope    vagal,  in the setting of GI cramping    Past Surgical History:  Procedure Laterality Date   CHOLECYSTECTOMY     KNEE ARTHROSCOPY     meniscal tear   right   PACEMAKER IMPLANT N/A 03/06/2020   Procedure: PACEMAKER IMPLANT;  Surgeon: Molly Grayer, MD;  Location: [facility];  Service: Cardiovascular;  Laterality: N/A;   ROBOTIC ASSISTED TOTAL  HYSTERECTOMY WITH BILATERAL SALPINGO OOPHERECTOMY N/A 10/07/2020   Procedure: XI ROBOTIC ASSISTED TOTAL HYSTERECTOMY WITH BILATERAL SALPINGO OOPHORECTOMY;  Surgeon: Molly Amber, MD;  Location: [facility];  Service: Gynecology;  Laterality: N/A;   SENTINEL NODE BIOPSY N/A 10/07/2020   Procedure: SENTINEL NODE BIOPSY;  Surgeon: Molly Amber, MD;  Location: [facility];  Service: Gynecology;  Laterality: N/A;   TONSILLECTOMY     TUBAL LIGATION      Current Medications: Current Meds  Medication Sig   amLODipine (NORVASC) 2.5 MG tablet Take 2.5 mg by mouth daily.   aspirin 81 MG tablet Take 81 mg by mouth daily.    Bioflavonoid Products (VITAMIN C) CHEW Chew 3 tablets by mouth daily.    Cholecalciferol (VITAMIN D) 125 MCG (5000 UT) CAPS Take 5,000 Units by mouth daily.    Cranberry 500 MG CHEW Chew 1,500 mg by mouth daily.   esomeprazole (NEXIUM) 40 MG capsule Take 40 mg by mouth daily.   fluticasone (FLONASE) 50 MCG/ACT nasal spray Place 2 sprays into both nostrils daily as needed for allergies or rhinitis.    furosemide (LASIX) 20 MG tablet Take 1 tablet (20 mg total) by mouth 2 (two) times daily.   levothyroxine (SYNTHROID, LEVOTHROID) 50 MCG tablet Take 50  mcg by mouth daily before breakfast.   metFORMIN (GLUCOPHAGE) 500 MG tablet Take 500 mg by mouth daily.   olmesartan (BENICAR) 40 MG tablet Take 40 mg by mouth daily.   Polyethyl Glyc-Propyl Glyc PF (SYSTANE HYDRATION PF) 0.4-0.3 % SOLN Place 1 drop into both eyes 2 (two) times daily as needed (dry eyes).   Probiotic Product (PROBIOTIC DAILY PO) Take 1 capsule by mouth daily.   psyllium (METAMUCIL) 58.6 % packet Take 1 packet by mouth at bedtime.   rosuvastatin (CRESTOR) 5 MG tablet Take 5 mg by mouth daily.   sodium chloride 1 g tablet Take 1 tablet (1 g total) by mouth 2 (two) times daily with a meal.     Allergies:   Amoxicillin, Sulfamethoxazole-trimethoprim, Ciprofloxacin, Atorvastatin, Clarithromycin, Ezetimibe, Fenofibrate,  Hydrochlorothiazide, Niaspan [niacin er], Welchol [colesevelam hcl], Zoster vaccine live, Clindamycin/lincomycin, and Keflex [cephalexin]   Social History   Socioeconomic History   Marital status: Married    Spouse name: Not on file   Number of children: Not on file   Years of education: Not on file   Highest education level: Not on file  Occupational History   Not on file  Tobacco Use   Smoking status: Never   Smokeless tobacco: Never  Vaping Use   Vaping Use: Never used  Substance and Sexual Activity   Alcohol use: Yes    Comment: 1-2 per year   Drug use: No   Sexual activity: Not Currently    Birth control/protection: Post-menopausal  Other Topics Concern   Not on file  Social History Narrative   Lives in [place] with spouse   Retired,  Previously a Materials engineer   Social Determinants of Radio broadcast assistant Strain: Not on file  Food Insecurity: Not on file  Transportation Needs: Not on file  Physical Activity: Not on file  Stress: Not on file  Social Connections: Not on file     Family History: The patient's family history includes Breast cancer in her cousin, niece, and sister; Colon cancer in her mother; Coronary artery disease in her mother; Heart disease in her mother; Hypertension in her mother; Lung cancer in her father; Other in her brother. There is no history of Ovarian cancer or Endometrial cancer.  ROS:   Please see the history of present illness.    No syncope no bleeding  all other systems reviewed and are negative.  EKGs/Labs/Other Studies Reviewed:    The following studies were reviewed today:  Cardiac CT 02/08/20: IMPRESSION: 1. Coronary calcium score of 0. This was 0 percentile for age and sex matched control.   2. Normal coronary origin with right dominance.   3. No evidence of CAD.   Molly Furbish, MD [facility]  EGD normal.   EKG:   05/25/21  shows A paced VS at 61 bpm, nonspecific T wave changes.  No change from prior.  Personally  reviewed.  Recent Labs: 05/23/2021: ALT 16; B Natriuretic Peptide 21.5; Magnesium 2.0 05/24/2021: Hemoglobin 14.4; Platelets 217; TSH 3.509 05/28/2021: BUN 25; Creatinine, Ser 1.19; Potassium 3.7; Sodium 127  Recent Lipid Panel No results found for: CHOL, TRIG, HDL, CHOLHDL, VLDL, LDLCALC, LDLDIRECT            Physical Exam:    VS:  BP (!) 150/70 (BP Location: Left Arm, Patient Position: Sitting, Cuff Size: Normal)   Pulse 62   Ht 5\' 5"  (1.651 m)   Wt 179 lb (81.2 kg)   SpO2 97%   BMI 29.79  kg/m     Wt Readings from Last 3 Encounters:  10/02/21 179 lb (81.2 kg)  07/13/21 175 lb 6.4 oz (79.6 kg)  06/23/21 176 lb (79.8 kg)     GEN:  Well nourished, well developed in no acute distress HEENT: Normal NECK: No JVD; No carotid bruits LYMPHATICS: No lymphadenopathy CARDIAC: RRR, no murmurs, no rubs, gallops RESPIRATORY:  Clear to auscultation without rales, wheezing or rhonchi  ABDOMEN: Soft, non-tender, non-distended MUSCULOSKELETAL:  No edema; No deformity  SKIN: Warm and dry NEUROLOGIC:  Alert and oriented x 3 PSYCHIATRIC:  Normal affect   ASSESSMENT:    1. Sick sinus syndrome ([place])   2. Essential hypertension, benign   3. Elevated CPK   4. Hyponatremia   5. Atypical chest pain    PLAN:    In order of problems listed above:  Sick sinus syndrome ([place]) Normal pacemaker function.  Prior Paceart report reviewed.  EP note from [place] reviewed.  Excellent.  No changes made.  Essential hypertension, benign Mildly elevated today 150/70.  Chronic issue.  Avoiding medications such as HCTZ which can cause hyponatremia.  Continuing with olmesartan 40 mg a day only takes Lasix 20 mg as needed.  Continue with checks at home. Used to be on losartan.   Elevated CPK Has had chronically mildly elevated CPK.  Stable.  Currently on low-dose rosuvastatin 5 mg once a day.  No myalgias.  Last LDL 70.  Triglycerides 137 HDL 65 from outside labs.  Hemoglobin 13.4 creatinine 0.9.   Potassium 4.8.  ALT normal  Hyponatremia On 40 oz fluid restriction.  Lasix PRN   Atypical chest pain Stomach pain back pain chest pain.  Thoroughly worked up.  GI evaluation unremarkable.  Coronary CT shows no coronary artery disease, calcium score of 0.  Excellent.  Reassurance has been given.         Medication Adjustments/Labs and Tests Ordered: Current medicines are reviewed at length with the patient today.  Concerns regarding medicines are outlined above.  No orders of the defined types were placed in this encounter.  No orders of the defined types were placed in this encounter.   Patient Instructions  Medication Instructions:  Your physician recommends that you continue on your current medications as directed. Please refer to the Current Medication list given to you today.  *If you need a refill on your cardiac medications before your next appointment, please call your pharmacy*   Lab Work: None ordered  If you have labs (blood work) drawn today and your tests are completely normal, you will receive your results only by: [place] (if you have MyChart) OR A paper copy in the mail If you have any lab test that is abnormal or we need to change your treatment, we will call you to review the results.   Testing/Procedures: None ordered   Follow-Up: At [facility], you and your health needs are our priority.  As part of our continuing mission to provide you with exceptional heart care, we have created designated Provider Care Teams.  These Care Teams include your primary Cardiologist (physician) and Advanced Practice Providers (APPs -  Physician Assistants and Nurse Practitioners) who all work together to provide you with the care you need, when you need it.  We recommend signing up for the patient portal called "MyChart".  Sign up information is provided on this After Visit Summary.  MyChart is used to connect with patients for Virtual Visits (Telemedicine).   Patients are able to view  lab/test results, encounter notes, upcoming appointments, etc.  Non-urgent messages can be sent to your provider as well.   To learn more about what you can do with MyChart, go to [email or web address].    Your next appointment:   12 month(s)  The format for your next appointment:   In Person  Provider:   Candee Furbish, MD     Other Instructions    Signed, Molly Furbish, MD  10/02/2021 9:33 AM    [place]

## 2021-10-02 NOTE — Patient Instructions (Signed)
Medication Instructions:  Your physician recommends that you continue on your current medications as directed. Please refer to the Current Medication list given to you today.  *If you need a refill on your cardiac medications before your next appointment, please call your pharmacy*   Lab Work: None ordered  If you have labs (blood work) drawn today and your tests are completely normal, you will receive your results only by: [place] (if you have MyChart) OR A paper copy in the mail If you have any lab test that is abnormal or we need to change your treatment, we will call you to review the results.   Testing/Procedures: None ordered   Follow-Up: At [facility], you and your health needs are our priority.  As part of our continuing mission to provide you with exceptional heart care, we have created designated Provider Care Teams.  These Care Teams include your primary Cardiologist (physician) and Advanced Practice Providers (APPs -  Physician Assistants and Nurse Practitioners) who all work together to provide you with the care you need, when you need it.  We recommend signing up for the patient portal called "MyChart".  Sign up information is provided on this After Visit Summary.  MyChart is used to connect with patients for Virtual Visits (Telemedicine).  Patients are able to view lab/test results, encounter notes, upcoming appointments, etc.  Non-urgent messages can be sent to your provider as well.   To learn more about what you can do with MyChart, go to [email or web address].    Your next appointment:   12 month(s)  The format for your next appointment:   In Person  Provider:   Candee Furbish, MD     Other Instructions

## 2021-10-02 NOTE — Assessment & Plan Note (Signed)
Stomach pain back pain chest pain.  Thoroughly worked up.  GI evaluation unremarkable.  Coronary CT shows no coronary artery disease, calcium score of 0.  Excellent.  Reassurance has been given.

## 2021-10-02 NOTE — Assessment & Plan Note (Signed)
Has had chronically mildly elevated CPK.  Stable.  Currently on low-dose rosuvastatin 5 mg once a day.  No myalgias.  Last LDL 70.  Triglycerides 137 HDL 65 from outside labs.  Hemoglobin 13.4 creatinine 0.9.  Potassium 4.8.  ALT normal

## 2021-10-02 NOTE — Assessment & Plan Note (Signed)
Normal pacemaker function.  Prior Paceart report reviewed.  EP note from [place] reviewed.  Excellent.  No changes made.

## 2021-10-02 NOTE — Assessment & Plan Note (Signed)
On 40 oz fluid restriction.  Lasix PRN

## 2021-10-02 NOTE — Assessment & Plan Note (Addendum)
Mildly elevated today 150/70.  Chronic issue.  Avoiding medications such as HCTZ which can cause hyponatremia.  Continuing with olmesartan 40 mg a day only takes Lasix 20 mg as needed.  Continue with checks at home. Used to be on losartan.

## 2021-10-27 ENCOUNTER — Ambulatory Visit
Admission: RE | Admit: 2021-10-27 | Discharge: 2021-10-27 | Disposition: A | Discharge status: Home / Self Care | Payer: Medicare Other | Source: Ambulatory Visit | Attending: Family Medicine | Admitting: Family Medicine

## 2021-10-27 DIAGNOSIS — Z1231 Encounter for screening mammogram for malignant neoplasm of breast: Secondary | ICD-10-CM

## 2021-11-06 DIAGNOSIS — R3915 Urgency of urination: Secondary | ICD-10-CM | POA: Diagnosis not present

## 2021-11-06 DIAGNOSIS — N390 Urinary tract infection, site not specified: Secondary | ICD-10-CM | POA: Diagnosis not present

## 2021-11-06 DIAGNOSIS — R81 Glycosuria: Secondary | ICD-10-CM | POA: Diagnosis not present

## 2021-11-06 DIAGNOSIS — I10 Essential (primary) hypertension: Secondary | ICD-10-CM | POA: Diagnosis not present

## 2021-11-20 DIAGNOSIS — R81 Glycosuria: Secondary | ICD-10-CM | POA: Diagnosis not present

## 2021-12-03 ENCOUNTER — Ambulatory Visit (INDEPENDENT_AMBULATORY_CARE_PROVIDER_SITE_OTHER): Payer: Medicare Other

## 2021-12-03 DIAGNOSIS — I495 Sick sinus syndrome: Secondary | ICD-10-CM

## 2021-12-03 LAB — CUP PACEART REMOTE DEVICE CHECK
Battery Remaining Longevity: 101 mo
Battery Remaining Percentage: 86 %
Battery Voltage: 3.02 V
Brady Statistic AP VP Percent: 1 %
Brady Statistic AP VS Percent: 95 %
Brady Statistic AS VP Percent: 1 %
Brady Statistic AS VS Percent: 4.2 %
Brady Statistic RA Percent Paced: 94 %
Brady Statistic RV Percent Paced: 1 %
Date Time Interrogation Session: 20230202040013
Implantable Lead Implant Date: 20210506
Implantable Lead Implant Date: 20210506
Implantable Lead Location: 753859
Implantable Lead Location: 753860
Implantable Pulse Generator Implant Date: 20210506
Lead Channel Impedance Value: 480 Ohm
Lead Channel Impedance Value: 490 Ohm
Lead Channel Pacing Threshold Amplitude: 0.5 V
Lead Channel Pacing Threshold Amplitude: 1.125 V
Lead Channel Pacing Threshold Pulse Width: 0.5 ms
Lead Channel Pacing Threshold Pulse Width: 0.5 ms
Lead Channel Sensing Intrinsic Amplitude: 1.2 mV
Lead Channel Sensing Intrinsic Amplitude: 12 mV
Lead Channel Setting Pacing Amplitude: 1.375
Lead Channel Setting Pacing Amplitude: 2 V
Lead Channel Setting Pacing Pulse Width: 0.5 ms
Lead Channel Setting Sensing Sensitivity: 2 mV
Pulse Gen Model: 2272
Pulse Gen Serial Number: 3825689

## 2021-12-09 NOTE — Progress Notes (Signed)
Remote pacemaker transmission.   

## 2021-12-10 ENCOUNTER — Ambulatory Visit
Admission: RE | Admit: 2021-12-10 | Discharge: 2021-12-10 | Disposition: A | Discharge status: Home / Self Care | Payer: Medicare Other | Source: Ambulatory Visit | Attending: Family Medicine | Admitting: Family Medicine

## 2021-12-10 DIAGNOSIS — M85851 Other specified disorders of bone density and structure, right thigh: Secondary | ICD-10-CM | POA: Diagnosis not present

## 2021-12-10 DIAGNOSIS — E2839 Other primary ovarian failure: Secondary | ICD-10-CM

## 2021-12-10 DIAGNOSIS — Z780 Asymptomatic menopausal state: Secondary | ICD-10-CM | POA: Diagnosis not present

## 2021-12-23 DIAGNOSIS — E119 Type 2 diabetes mellitus without complications: Secondary | ICD-10-CM | POA: Diagnosis not present

## 2021-12-23 DIAGNOSIS — Z0000 Encounter for general adult medical examination without abnormal findings: Secondary | ICD-10-CM | POA: Diagnosis not present

## 2021-12-23 DIAGNOSIS — K219 Gastro-esophageal reflux disease without esophagitis: Secondary | ICD-10-CM | POA: Diagnosis not present

## 2021-12-23 DIAGNOSIS — I10 Essential (primary) hypertension: Secondary | ICD-10-CM | POA: Diagnosis not present

## 2021-12-23 DIAGNOSIS — E559 Vitamin D deficiency, unspecified: Secondary | ICD-10-CM | POA: Diagnosis not present

## 2021-12-23 DIAGNOSIS — E039 Hypothyroidism, unspecified: Secondary | ICD-10-CM | POA: Diagnosis not present

## 2021-12-23 DIAGNOSIS — M85851 Other specified disorders of bone density and structure, right thigh: Secondary | ICD-10-CM | POA: Diagnosis not present

## 2021-12-23 DIAGNOSIS — J301 Allergic rhinitis due to pollen: Secondary | ICD-10-CM | POA: Diagnosis not present

## 2021-12-23 DIAGNOSIS — E7800 Pure hypercholesterolemia, unspecified: Secondary | ICD-10-CM | POA: Diagnosis not present

## 2021-12-23 DIAGNOSIS — J0100 Acute maxillary sinusitis, unspecified: Secondary | ICD-10-CM | POA: Diagnosis not present

## 2021-12-23 DIAGNOSIS — Z950 Presence of cardiac pacemaker: Secondary | ICD-10-CM | POA: Diagnosis not present

## 2021-12-23 DIAGNOSIS — R600 Localized edema: Secondary | ICD-10-CM | POA: Diagnosis not present

## 2021-12-23 DIAGNOSIS — Z8542 Personal history of malignant neoplasm of other parts of uterus: Secondary | ICD-10-CM | POA: Diagnosis not present

## 2022-01-12 ENCOUNTER — Encounter: Payer: Self-pay | Admitting: Obstetrics & Gynecology

## 2022-01-14 ENCOUNTER — Encounter: Payer: Self-pay | Admitting: Obstetrics & Gynecology

## 2022-01-14 ENCOUNTER — Inpatient Hospital Stay: Payer: Medicare Other | Attending: Obstetrics & Gynecology | Admitting: Obstetrics & Gynecology

## 2022-01-14 ENCOUNTER — Other Ambulatory Visit: Payer: Self-pay

## 2022-01-14 VITALS — BP 147/59 | HR 60 | Resp 16 | Ht 64.0 in | Wt 179.7 lb

## 2022-01-14 DIAGNOSIS — Z8542 Personal history of malignant neoplasm of other parts of uterus: Secondary | ICD-10-CM | POA: Diagnosis not present

## 2022-01-14 DIAGNOSIS — C541 Malignant neoplasm of endometrium: Secondary | ICD-10-CM

## 2022-01-14 NOTE — Progress Notes (Signed)
Follow Up Note: Gyn-Onc ? ?[name] 76 y.o. female ? ?CC: She presents for a f/u visit ? ? ?HPI: The oncology history was reviewed. ? ?Interval History: She denies any vaginal bleeding, abdominal/pelvic pain, cough, lethargy or increasing abdominal girth.  ? ?Review of Systems  ?Review of Systems  ?Constitutional:  Negative for malaise/fatigue and weight loss.  ?Respiratory:  Negative for shortness of breath and wheezing.   ?Cardiovascular:  Negative for chest pain and leg swelling.  ?Gastrointestinal:  Negative for abdominal pain, blood in stool, constipation, nausea and vomiting.  ?Genitourinary:  Negative for dysuria, frequency, hematuria and urgency.  ?Musculoskeletal:  Negative for joint pain and myalgias.  ?Neurological:  Negative for weakness.  ?Psychiatric/Behavioral:  Negative for depression. The patient does not have insomnia.   ? ?Current medications, allergy, social history, past surgical history, past medical history, family history were all reviewed. ? ? ? ?Vitals:  BP (!) 147/59 (BP Location: Right Arm, Patient Position: Sitting)   Pulse 60   Resp 16   Ht $R'5\' 4"'Pc$  (1.626 m)   Wt 179 lb 11.2 oz (81.5 kg)   SpO2 99%   BMI 30.85 kg/m?   ? ? ?Physical Exam ?Exam conducted with a chaperone present.  ?Constitutional:   ?   General: She is not in acute distress. ?Cardiovascular:  ?   Rate and Rhythm: Normal rate and regular rhythm.  ?Pulmonary:  ?   Effort: Pulmonary effort is normal.  ?   Breath sounds: Normal breath sounds. No wheezing or rhonchi.  ?Abdominal:  ?   Palpations: Abdomen is soft.  ?   Tenderness: There is no abdominal tenderness. There is no right CVA tenderness or left CVA tenderness.  ?   Hernia: No hernia is present.  ?Genitourinary: ?   General: Normal vulva.  ?   Urethra: No urethral lesion.  ?   Vagina: No lesions. No bleeding ?Musculoskeletal:  ?   Cervical back: Neck supple.  ?   Right lower leg: No edema.  ?   Left lower leg: No edema.  ?Lymphadenopathy:  ?   Upper Body:  ?    Right upper body: No supraclavicular adenopathy.  ?   Left upper body: No supraclavicular adenopathy.  ?   Lower Body: No right inguinal adenopathy. No left inguinal adenopathy.  ?Skin: ?   Findings: No rash.  ?Neurological:  ?   Mental Status: She is oriented to person, place, and time.  ? ?Assessment/Plan:  ?Endometrial cancer ([place]) ?[name]  is a 76 y.o.  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 ? Negative symptom review, normal exam.  No evidence of recurrence ? ? >continue follow-up every 6 months for 5 years in accordance with NCCN guidelines.   ? ? ?Lahoma Crocker, MD  ?

## 2022-01-14 NOTE — Patient Instructions (Signed)
Return in 6 mos

## 2022-01-14 NOTE — Assessment & Plan Note (Signed)
[name]  is a 76 y.o.  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 ??Negative symptom review, normal exam.  No evidence of recurrence ? ??>continue follow-up every 6 months for 5 years in accordance with NCCN guidelines.  ?

## 2022-03-04 ENCOUNTER — Ambulatory Visit (INDEPENDENT_AMBULATORY_CARE_PROVIDER_SITE_OTHER): Payer: Medicare Other

## 2022-03-04 DIAGNOSIS — I495 Sick sinus syndrome: Secondary | ICD-10-CM | POA: Diagnosis not present

## 2022-03-04 LAB — CUP PACEART REMOTE DEVICE CHECK
Battery Remaining Longevity: 99 mo
Battery Remaining Percentage: 83 %
Battery Voltage: 3.02 V
Brady Statistic AP VP Percent: 1 %
Brady Statistic AP VS Percent: 96 %
Brady Statistic AS VP Percent: 1 %
Brady Statistic AS VS Percent: 3.8 %
Brady Statistic RA Percent Paced: 94 %
Brady Statistic RV Percent Paced: 1 %
Date Time Interrogation Session: 20230504040012
Implantable Lead Implant Date: 20210506
Implantable Lead Implant Date: 20210506
Implantable Lead Location: 753859
Implantable Lead Location: 753860
Implantable Pulse Generator Implant Date: 20210506
Lead Channel Impedance Value: 490 Ohm
Lead Channel Impedance Value: 510 Ohm
Lead Channel Pacing Threshold Amplitude: 0.5 V
Lead Channel Pacing Threshold Amplitude: 1 V
Lead Channel Pacing Threshold Pulse Width: 0.5 ms
Lead Channel Pacing Threshold Pulse Width: 0.5 ms
Lead Channel Sensing Intrinsic Amplitude: 12 mV
Lead Channel Sensing Intrinsic Amplitude: 2.6 mV
Lead Channel Setting Pacing Amplitude: 1.25 V
Lead Channel Setting Pacing Amplitude: 2 V
Lead Channel Setting Pacing Pulse Width: 0.5 ms
Lead Channel Setting Sensing Sensitivity: 2 mV
Pulse Gen Model: 2272
Pulse Gen Serial Number: 3825689

## 2022-03-17 NOTE — Progress Notes (Signed)
Remote pacemaker transmission.   

## 2022-05-05 DIAGNOSIS — J0181 Other acute recurrent sinusitis: Secondary | ICD-10-CM | POA: Diagnosis not present

## 2022-06-03 ENCOUNTER — Ambulatory Visit (INDEPENDENT_AMBULATORY_CARE_PROVIDER_SITE_OTHER): Payer: Medicare Other

## 2022-06-03 DIAGNOSIS — I495 Sick sinus syndrome: Secondary | ICD-10-CM | POA: Diagnosis not present

## 2022-06-03 LAB — CUP PACEART REMOTE DEVICE CHECK
Battery Remaining Longevity: 94 mo
Battery Remaining Percentage: 81 %
Battery Voltage: 3.02 V
Brady Statistic AP VP Percent: 1 %
Brady Statistic AP VS Percent: 96 %
Brady Statistic AS VP Percent: 1 %
Brady Statistic AS VS Percent: 3.3 %
Brady Statistic RA Percent Paced: 95 %
Brady Statistic RV Percent Paced: 1 %
Date Time Interrogation Session: 20230803040015
Implantable Lead Implant Date: 20210506
Implantable Lead Implant Date: 20210506
Implantable Lead Location: 753859
Implantable Lead Location: 753860
Implantable Pulse Generator Implant Date: 20210506
Lead Channel Impedance Value: 460 Ohm
Lead Channel Impedance Value: 460 Ohm
Lead Channel Pacing Threshold Amplitude: 0.5 V
Lead Channel Pacing Threshold Amplitude: 0.875 V
Lead Channel Pacing Threshold Pulse Width: 0.5 ms
Lead Channel Pacing Threshold Pulse Width: 0.5 ms
Lead Channel Sensing Intrinsic Amplitude: 1.2 mV
Lead Channel Sensing Intrinsic Amplitude: 12 mV
Lead Channel Setting Pacing Amplitude: 1.125
Lead Channel Setting Pacing Amplitude: 2 V
Lead Channel Setting Pacing Pulse Width: 0.5 ms
Lead Channel Setting Sensing Sensitivity: 2 mV
Pulse Gen Model: 2272
Pulse Gen Serial Number: 3825689

## 2022-06-16 DIAGNOSIS — E039 Hypothyroidism, unspecified: Secondary | ICD-10-CM | POA: Diagnosis not present

## 2022-06-16 DIAGNOSIS — E1165 Type 2 diabetes mellitus with hyperglycemia: Secondary | ICD-10-CM | POA: Diagnosis not present

## 2022-06-16 DIAGNOSIS — E559 Vitamin D deficiency, unspecified: Secondary | ICD-10-CM | POA: Diagnosis not present

## 2022-06-16 DIAGNOSIS — R600 Localized edema: Secondary | ICD-10-CM | POA: Diagnosis not present

## 2022-06-16 DIAGNOSIS — J301 Allergic rhinitis due to pollen: Secondary | ICD-10-CM | POA: Diagnosis not present

## 2022-06-16 DIAGNOSIS — Z950 Presence of cardiac pacemaker: Secondary | ICD-10-CM | POA: Diagnosis not present

## 2022-06-16 DIAGNOSIS — E7800 Pure hypercholesterolemia, unspecified: Secondary | ICD-10-CM | POA: Diagnosis not present

## 2022-06-16 DIAGNOSIS — I10 Essential (primary) hypertension: Secondary | ICD-10-CM | POA: Diagnosis not present

## 2022-06-16 DIAGNOSIS — K219 Gastro-esophageal reflux disease without esophagitis: Secondary | ICD-10-CM | POA: Diagnosis not present

## 2022-06-21 NOTE — Progress Notes (Signed)
Electrophysiology Office Note Date: 06/28/2022  ID:  [name], [name] [birth year], MRN [id number]  PCP: Shirline Frees, MD Primary Cardiologist: Candee Furbish, MD Electrophysiologist: [name], MD   CC: Pacemaker follow-up  [name] is a 76 y.o. female seen today for [name], MD for routine electrophysiology followup.  Since last being seen in our clinic the patient reports doing well overall.  she denies chest pain, palpitations, dyspnea, PND, orthopnea, nausea, vomiting, dizziness, syncope, edema, weight gain, or early satiety.  Device History: St. 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  Past Medical History:  Diagnosis Date   Acute meniscal tear of knee    Anxiety    Basal cell carcinoma    endometrial  cancer   Colon polyp    Diabetes ([place])    type 2   Diverticulosis    Edema    Esophageal reflux    Fibrocystic breast    Hemorrhoids    HTN (hypertension)    Hyperlipidemia    Hyponatremia    Hypothyroidism    OA (osteoarthritis) of hip    Presence of permanent cardiac pacemaker    due to bradycardia  03-2020   Seasonal allergies    Sinus bradycardia    Syncope    vagal,  in the setting of GI cramping   Past Surgical History:  Procedure Laterality Date   CHOLECYSTECTOMY     KNEE ARTHROSCOPY     meniscal tear   right   PACEMAKER IMPLANT N/A 03/06/2020   Procedure: PACEMAKER IMPLANT;  Surgeon: [name], MD;  Location: [facility];  Service: Cardiovascular;  Laterality: N/A;   ROBOTIC ASSISTED TOTAL HYSTERECTOMY WITH BILATERAL SALPINGO OOPHERECTOMY N/A 10/07/2020   Procedure: XI ROBOTIC ASSISTED TOTAL HYSTERECTOMY WITH BILATERAL SALPINGO OOPHORECTOMY;  Surgeon: Everitt Amber, MD;  [facility];  Service: Gynecology;  Laterality: N/A;   SENTINEL NODE BIOPSY N/A 10/07/2020   Procedure: SENTINEL NODE BIOPSY;  Surgeon: Everitt Amber, MD;  Location: [facility];  Service: Gynecology;  Laterality: N/A;   TONSILLECTOMY      TUBAL LIGATION      Current Outpatient Medications  Medication Sig Dispense Refill   amLODipine (NORVASC) 2.5 MG tablet Take 2.5 mg by mouth daily.     aspirin 81 MG tablet Take 81 mg by mouth daily.      Bioflavonoid Products (VITAMIN C) CHEW Chew 3 tablets by mouth daily.      Calcium Carbonate (CALCIUM 500 PO) Take by mouth.     Cholecalciferol (VITAMIN D) 125 MCG (5000 UT) CAPS Take 5,000 Units by mouth daily.      Cranberry 500 MG CHEW Chew 1,500 mg by mouth daily.     esomeprazole (NEXIUM) 40 MG capsule Take 40 mg by mouth daily.     fluticasone (FLONASE) 50 MCG/ACT nasal spray Place 2 sprays into both nostrils daily as needed for allergies or rhinitis.      furosemide (LASIX) 20 MG tablet Take 1-2 tablets by mouth daily as needed for fluid or edema.     levothyroxine (SYNTHROID, LEVOTHROID) 50 MCG tablet Take 50 mcg by mouth daily before breakfast.     metFORMIN (GLUCOPHAGE-XR) 500 MG 24 hr tablet Take 500 mg by mouth 2 (two) times daily.     olmesartan (BENICAR) 40 MG tablet Take 40 mg by mouth daily.     Polyethyl Glyc-Propyl Glyc PF (SYSTANE HYDRATION PF) 0.4-0.3 % SOLN Place 1 drop into both eyes 2 (two) times  daily as needed (dry eyes).     Probiotic Product (PROBIOTIC DAILY PO) Take 1 capsule by mouth daily.     psyllium (METAMUCIL) 58.6 % packet Take 1 packet by mouth at bedtime.     rosuvastatin (CRESTOR) 5 MG tablet Take 5 mg by mouth daily.     No current facility-administered medications for this visit.    Allergies:   Amoxicillin, Sulfamethoxazole-trimethoprim, Ciprofloxacin, Atorvastatin, Clarithromycin, Ezetimibe, Fenofibrate, Hydrochlorothiazide, Niaspan [niacin er], Welchol [colesevelam hcl], Zoster vaccine live, Clindamycin/lincomycin, Keflex [cephalexin], and Metronidazole   Social History: Social History   Socioeconomic History   Marital status: Married    Spouse name: Not on file   Number of children: Not on file   Years of education: Not on file    Highest education level: Not on file  Occupational History   Not on file  Tobacco Use   Smoking status: Never   Smokeless tobacco: Never  Vaping Use   Vaping Use: Never used  Substance and Sexual Activity   Alcohol use: Yes    Comment: 1-2 per year   Drug use: No   Sexual activity: Not Currently    Birth control/protection: Post-menopausal  Other Topics Concern   Not on file  Social History Narrative   Lives in [place] with spouse   Retired,  Previously a Materials engineer   Social Determinants of Radio broadcast assistant Strain: Not on file  Food Insecurity: Not on file  Transportation Needs: Not on file  Physical Activity: Not on file  Stress: Not on file  Social Connections: Not on file  Intimate Partner Violence: Not on file    Family History: Family History  Problem Relation Age of Onset   Colon cancer Mother    Hypertension Mother    Heart disease Mother    Coronary artery disease Mother    Lung cancer Father    Breast cancer Sister 105   Breast cancer Cousin    Other Brother        CABG   Breast cancer Niece        95s or 13s   Ovarian cancer Neg Hx    Endometrial cancer Neg Hx      Review of Systems: All other systems reviewed and are otherwise negative except as noted above.  Physical Exam: Vitals:   06/28/22 0918  BP: 136/74  Pulse: 60  SpO2: 97%  Weight: 180 lb 3.2 oz (81.7 kg)  Height: 5' 4.5" (1.638 m)     GEN- The patient is well appearing, alert and oriented x 3 today.   HEENT: normocephalic, atraumatic; sclera clear, conjunctiva pink; hearing intact; oropharynx clear; neck supple  Lungs- Clear to ausculation bilaterally, normal work of breathing.  No wheezes, rales, rhonchi Heart- Regular rate and rhythm, no murmurs, rubs or gallops  GI- soft, non-tender, non-distended, bowel sounds present  Extremities- no clubbing or cyanosis. No edema MS- no significant deformity or atrophy Skin- warm and dry, no rash or lesion; PPM pocket well  healed Psych- euthymic mood, full affect Neuro- strength and sensation are intact  PPM Interrogation- reviewed in detail today,  See PACEART report  EKG:  EKG is ordered today. Personal review of ekg ordered today shows A paced VS at 60 bpm   Recent Labs: No results found for requested labs within last 365 days.   Wt Readings from Last 3 Encounters:  06/28/22 180 lb 3.2 oz (81.7 kg)  01/14/22 179 lb 11.2 oz (81.5 kg)  10/02/21 179  lb (81.2 kg)     Other studies Reviewed: Additional studies/ records that were reviewed today include: Previous EP office notes, Previous remote checks, Most recent labwork.   Assessment and Plan:  1. Symptomatic bradycardia s/p St. Jude PPM  Normal PPM function See Pace Art report No changes today  2. HTN Stable on current regimen   3. HLD Continue statin.   Current medicines are reviewed at length with the patient today.    Labs/ tests ordered today include:  Labs last month per PCP stable per pt.    Disposition:   Follow up with Dr. Curt Bears in 12 months to establish from Dr. [name]    Signed, Shirley Friar, PA-C  06/28/2022 9:29 AM  [facility] [address] [place] [address] [phone] (office) [phone] (fax)

## 2022-06-24 NOTE — Progress Notes (Signed)
Remote pacemaker transmission.   

## 2022-06-28 ENCOUNTER — Ambulatory Visit: Payer: Medicare Other | Attending: Student | Admitting: Student

## 2022-06-28 ENCOUNTER — Encounter: Payer: Self-pay | Admitting: Student

## 2022-06-28 VITALS — BP 136/74 | HR 60 | Ht 64.5 in | Wt 180.2 lb

## 2022-06-28 DIAGNOSIS — I10 Essential (primary) hypertension: Secondary | ICD-10-CM | POA: Insufficient documentation

## 2022-06-28 DIAGNOSIS — I495 Sick sinus syndrome: Secondary | ICD-10-CM | POA: Insufficient documentation

## 2022-06-28 LAB — CUP PACEART INCLINIC DEVICE CHECK
Battery Remaining Longevity: 96 mo
Battery Voltage: 3.01 V
Brady Statistic RA Percent Paced: 95 %
Brady Statistic RV Percent Paced: 0.2 %
Date Time Interrogation Session: 20230828094036
Implantable Lead Implant Date: 20210506
Implantable Lead Implant Date: 20210506
Implantable Lead Location: 753859
Implantable Lead Location: 753860
Implantable Pulse Generator Implant Date: 20210506
Lead Channel Impedance Value: 475 Ohm
Lead Channel Impedance Value: 487.5 Ohm
Lead Channel Pacing Threshold Amplitude: 0.5 V
Lead Channel Pacing Threshold Amplitude: 0.5 V
Lead Channel Pacing Threshold Amplitude: 1 V
Lead Channel Pacing Threshold Pulse Width: 0.5 ms
Lead Channel Pacing Threshold Pulse Width: 0.5 ms
Lead Channel Pacing Threshold Pulse Width: 0.5 ms
Lead Channel Sensing Intrinsic Amplitude: 0.7 mV
Lead Channel Sensing Intrinsic Amplitude: 12 mV
Lead Channel Setting Pacing Amplitude: 1.25 V
Lead Channel Setting Pacing Amplitude: 2 V
Lead Channel Setting Pacing Pulse Width: 0.5 ms
Lead Channel Setting Sensing Sensitivity: 2 mV
Pulse Gen Model: 2272
Pulse Gen Serial Number: 3825689

## 2022-06-28 NOTE — Patient Instructions (Signed)
Medication Instructions:  Your physician recommends that you continue on your current medications as directed. Please refer to the Current Medication list given to you today.  *If you need a refill on your cardiac medications before your next appointment, please call your pharmacy*   Lab Work: None If you have labs (blood work) drawn today and your tests are completely normal, you will receive your results only by: [place] (if you have MyChart) OR A paper copy in the mail If you have any lab test that is abnormal or we need to change your treatment, we will call you to review the results.   Follow-Up: At [facility], you and your health needs are our priority.  As part of our continuing mission to provide you with exceptional heart care, we have created designated Provider Care Teams.  These Care Teams include your primary Cardiologist (physician) and Advanced Practice Providers (APPs -  Physician Assistants and Nurse Practitioners) who all work together to provide you with the care you need, when you need it.  Your next appointment:   1 year(s)  The format for your next appointment:   In Person  Provider:   Allegra Lai, MD

## 2022-07-02 ENCOUNTER — Encounter: Payer: Self-pay | Admitting: Obstetrics & Gynecology

## 2022-07-06 NOTE — Progress Notes (Unsigned)
Follow Up Note: Gyn-Onc  [name] 75 y.o. female  CC: She presents for a f/u visit   HPI: The oncology history was reviewed.  Interval History: She denies any vaginal bleeding, abdominal/pelvic pain, cough, lethargy or increasing abdominal girth. C/O intermittent burning--?external; no abnormal discharge. H/O rUTI.    Review of Systems  Review of Systems  Constitutional:  Negative for malaise/fatigue and weight loss.  Respiratory:  Negative for shortness of breath and wheezing.   Cardiovascular:  Negative for chest pain and leg swelling.  Gastrointestinal:  Negative for abdominal pain, blood in stool, constipation, nausea and vomiting.  Genitourinary:  Negative for dysuria, frequency, hematuria and urgency.  Musculoskeletal:  Negative for joint pain and myalgias.  Neurological:  Negative for weakness.  Psychiatric/Behavioral:  Negative for depression. The patient does not have insomnia.    Current medications, allergy, social history, past surgical history, past medical history, family history were all reviewed.    Vitals:  BP (!) 163/67 (BP Location: Left Arm, Patient Position: Sitting)   Pulse 64   Temp 98.6 F (37 C) (Oral)   Resp 14   Ht 5' 4.49" (1.638 m)   Wt 179 lb 6.4 oz (81.4 kg)   SpO2 97%   BMI 30.33 kg/m      Physical Exam Exam conducted with a chaperone present.  Constitutional:      General: She is not in acute distress. Cardiovascular:     Rate and Rhythm: Normal rate and regular rhythm.  Pulmonary:     Effort: Pulmonary effort is normal.     Breath sounds: Normal breath sounds. No wheezing or rhonchi.  Abdominal:     Palpations: Abdomen is soft.     Tenderness: There is no abdominal tenderness. There is no right CVA tenderness or left CVA tenderness.     Hernia: No hernia is present.  Genitourinary:    General: Normal vulva. Mild erythema    Urethra: No urethral lesion.     Vagina: No lesions. No bleeding.  Synechiae at the  apex Musculoskeletal:     Cervical back: Neck supple.     Right lower leg: No edema.     Left lower leg: No edema.  Lymphadenopathy:     Upper Body:     Right upper body: No supraclavicular adenopathy.     Left upper body: No supraclavicular adenopathy.     Lower Body: No right inguinal adenopathy. No left inguinal adenopathy.  Skin:    Findings: No rash.  Neurological:     Mental Status: She is oriented to person, place, and time.   Assessment/Plan:  No problem-specific Assessment & Plan notes found for this encounter.   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  

## 2022-07-06 NOTE — Assessment & Plan Note (Signed)
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

## 2022-07-07 ENCOUNTER — Encounter: Payer: Self-pay | Admitting: Obstetrics & Gynecology

## 2022-07-07 ENCOUNTER — Inpatient Hospital Stay: Payer: Medicare Other | Attending: Obstetrics & Gynecology | Admitting: Obstetrics & Gynecology

## 2022-07-07 ENCOUNTER — Other Ambulatory Visit: Payer: Self-pay

## 2022-07-07 VITALS — BP 163/67 | HR 64 | Temp 98.6°F | Resp 14 | Ht 64.49 in | Wt 179.4 lb

## 2022-07-07 DIAGNOSIS — Z8542 Personal history of malignant neoplasm of other parts of uterus: Secondary | ICD-10-CM

## 2022-07-07 DIAGNOSIS — C541 Malignant neoplasm of endometrium: Secondary | ICD-10-CM

## 2022-07-07 NOTE — Patient Instructions (Signed)
Return in 6 months    Vulvar/Vaginal Moisturizers  Moisturizer Options: Vitamin E oil: pump or capsule form Vitamin E cream (Gene's vitamin E cream) Coconut oil: bottle or bead form Shea butter Blossom Organic Lubricant (organic and all natural; www.blossomorganics.com) PE suppository(coconut oil/vitamin E/palm oil) Desert Harvest Aloe Glide      Consider the ingredients of the product - the fewer the ingredients the better!  Directions for Use: Clean and dry your hands Gently dab the vulvar/vaginal area dry as needed Apply a "pea-sized" amount of the moisturizer onto your fingertip Using you other hand, open the labia   Apply the moisturizer to the vulvar/vaginal tissues Wear loose fitting underwear/clothing if possible following application  Use moisturize 2-3 times daily as desired.

## 2022-07-08 DIAGNOSIS — N3020 Other chronic cystitis without hematuria: Secondary | ICD-10-CM | POA: Diagnosis not present

## 2022-07-21 ENCOUNTER — Ambulatory Visit: Payer: Medicare Other | Admitting: Obstetrics & Gynecology

## 2022-08-02 DIAGNOSIS — H02834 Dermatochalasis of left upper eyelid: Secondary | ICD-10-CM | POA: Diagnosis not present

## 2022-08-02 DIAGNOSIS — H40053 Ocular hypertension, bilateral: Secondary | ICD-10-CM | POA: Diagnosis not present

## 2022-08-02 DIAGNOSIS — H02831 Dermatochalasis of right upper eyelid: Secondary | ICD-10-CM | POA: Diagnosis not present

## 2022-08-02 DIAGNOSIS — E119 Type 2 diabetes mellitus without complications: Secondary | ICD-10-CM | POA: Diagnosis not present

## 2022-08-02 DIAGNOSIS — H31002 Unspecified chorioretinal scars, left eye: Secondary | ICD-10-CM | POA: Diagnosis not present

## 2022-08-02 DIAGNOSIS — H52203 Unspecified astigmatism, bilateral: Secondary | ICD-10-CM | POA: Diagnosis not present

## 2022-08-19 DIAGNOSIS — M542 Cervicalgia: Secondary | ICD-10-CM | POA: Diagnosis not present

## 2022-08-19 DIAGNOSIS — J0110 Acute frontal sinusitis, unspecified: Secondary | ICD-10-CM | POA: Diagnosis not present

## 2022-08-19 DIAGNOSIS — R1012 Left upper quadrant pain: Secondary | ICD-10-CM | POA: Diagnosis not present

## 2022-09-02 ENCOUNTER — Ambulatory Visit: Payer: Medicare Other | Attending: Cardiovascular Disease

## 2022-09-02 DIAGNOSIS — I495 Sick sinus syndrome: Secondary | ICD-10-CM

## 2022-09-03 LAB — CUP PACEART REMOTE DEVICE CHECK
Battery Remaining Longevity: 92 mo
Battery Remaining Percentage: 78 %
Battery Voltage: 3.02 V
Brady Statistic AP VP Percent: 1 %
Brady Statistic AP VS Percent: 96 %
Brady Statistic AS VP Percent: 1 %
Brady Statistic AS VS Percent: 3.1 %
Brady Statistic RA Percent Paced: 94 %
Brady Statistic RV Percent Paced: 1 %
Date Time Interrogation Session: 20231102043020
Implantable Lead Connection Status: 753985
Implantable Lead Connection Status: 753985
Implantable Lead Implant Date: 20210506
Implantable Lead Implant Date: 20210506
Implantable Lead Location: 753859
Implantable Lead Location: 753860
Implantable Pulse Generator Implant Date: 20210506
Lead Channel Impedance Value: 460 Ohm
Lead Channel Impedance Value: 490 Ohm
Lead Channel Pacing Threshold Amplitude: 0.5 V
Lead Channel Pacing Threshold Amplitude: 0.875 V
Lead Channel Pacing Threshold Pulse Width: 0.5 ms
Lead Channel Pacing Threshold Pulse Width: 0.5 ms
Lead Channel Sensing Intrinsic Amplitude: 1.4 mV
Lead Channel Sensing Intrinsic Amplitude: 12 mV
Lead Channel Setting Pacing Amplitude: 1.125
Lead Channel Setting Pacing Amplitude: 2 V
Lead Channel Setting Pacing Pulse Width: 0.5 ms
Lead Channel Setting Sensing Sensitivity: 2 mV
Pulse Gen Model: 2272
Pulse Gen Serial Number: 3825689

## 2022-09-13 NOTE — Progress Notes (Signed)
Remote pacemaker transmission.   

## 2022-09-14 ENCOUNTER — Other Ambulatory Visit: Payer: Self-pay | Admitting: Family Medicine

## 2022-09-14 DIAGNOSIS — Z1231 Encounter for screening mammogram for malignant neoplasm of breast: Secondary | ICD-10-CM

## 2022-09-14 DIAGNOSIS — M25561 Pain in right knee: Secondary | ICD-10-CM | POA: Diagnosis not present

## 2022-10-05 ENCOUNTER — Ambulatory Visit: Payer: Medicare Other | Attending: Cardiology | Admitting: Cardiology

## 2022-10-05 ENCOUNTER — Encounter: Payer: Self-pay | Admitting: Cardiology

## 2022-10-05 VITALS — BP 138/72 | HR 65 | Ht 64.5 in | Wt 181.0 lb

## 2022-10-05 DIAGNOSIS — E871 Hypo-osmolality and hyponatremia: Secondary | ICD-10-CM | POA: Insufficient documentation

## 2022-10-05 DIAGNOSIS — I10 Essential (primary) hypertension: Secondary | ICD-10-CM | POA: Diagnosis not present

## 2022-10-05 DIAGNOSIS — I495 Sick sinus syndrome: Secondary | ICD-10-CM | POA: Diagnosis not present

## 2022-10-05 DIAGNOSIS — R0789 Other chest pain: Secondary | ICD-10-CM | POA: Diagnosis not present

## 2022-10-05 NOTE — Progress Notes (Signed)
Cardiology Office Note:    Date:  10/05/2022   ID:  [name], DOB 1946-03-21, MRN [id number]  PCP:  Shirline Frees, MD   [facility] Providers Cardiologist:  Candee Furbish, MD Electrophysiologist:  [name], MD     Referring MD: Shirline Frees, MD    History of Present Illness:    [name] is a 76 y.o. female here for follow-up, pacemaker placement, prior history of chronic SIADH with hyponatremia.  She was in the hospital in July 2022 with cramping bilateral thighs and found to have sodium 126 with chronic history of SIADH.  Had AKI that resolved with conservative management.   Sweeping floor with pain across stomach and wraps around back and then into chest. Goes away with rest. Checked stomach.  Normal.  Once again reassured her about her coronary CT scan that showed no CAD, calcium score was 0.  Today has knee pain, arthritis. [facility]. Roght greater than left. Cortisone shot. BLE edema.   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  She has been to [place] in the 1980s.  Past Medical History:  Diagnosis Date   Acute meniscal tear of knee    Anxiety    Basal cell carcinoma    endometrial  cancer   Colon polyp    Diabetes ([place])    type 2   Diverticulosis    Edema    Esophageal reflux    Fibrocystic breast    Hemorrhoids    HTN (hypertension)    Hyperlipidemia    Hyponatremia    Hypothyroidism    OA (osteoarthritis) of hip    Presence of permanent cardiac pacemaker    due to bradycardia  03-2020   Seasonal allergies    Sinus bradycardia    Syncope    vagal,  in the setting of GI cramping    Past Surgical History:  Procedure Laterality Date   CHOLECYSTECTOMY     KNEE ARTHROSCOPY     meniscal tear   right   PACEMAKER IMPLANT N/A 03/06/2020   Procedure: PACEMAKER IMPLANT;  Surgeon: [name], MD;   Location: [facility];  Service: Cardiovascular;  Laterality: N/A;   ROBOTIC ASSISTED TOTAL HYSTERECTOMY WITH BILATERAL SALPINGO OOPHERECTOMY N/A 10/07/2020   Procedure: XI ROBOTIC ASSISTED TOTAL HYSTERECTOMY WITH BILATERAL SALPINGO OOPHORECTOMY;  Surgeon: Everitt Amber, MD;  Location: [facility];  Service: Gynecology;  Laterality: N/A;   SENTINEL NODE BIOPSY N/A 10/07/2020   Procedure: SENTINEL NODE BIOPSY;  Surgeon: Everitt Amber, MD;  Location: [facility];  Service: Gynecology;  Laterality: N/A;   TONSILLECTOMY     TUBAL LIGATION      Current Medications: Current Meds  Medication Sig   amLODipine (NORVASC) 2.5 MG tablet Take 2.5 mg by mouth daily.   aspirin 81 MG tablet Take 81 mg by mouth daily.    Bioflavonoid Products (VITAMIN C) CHEW Chew 3 tablets by mouth daily.    Calcium Carbonate (CALCIUM 500 PO) Take by mouth.   Cholecalciferol (VITAMIN D) 125 MCG (5000 UT) CAPS Take 5,000 Units by mouth daily.    Cranberry 500 MG CHEW Chew 1,500 mg by mouth daily.   esomeprazole (NEXIUM) 40 MG capsule Take 40 mg by mouth daily.   fluticasone (FLONASE) 50 MCG/ACT nasal spray Place 2 sprays into both nostrils daily as needed for allergies or rhinitis.  furosemide (LASIX) 20 MG tablet Take 1-2 tablets by mouth daily as needed for fluid or edema.   levothyroxine (SYNTHROID, LEVOTHROID) 50 MCG tablet Take 50 mcg by mouth daily before breakfast.   metFORMIN (GLUCOPHAGE-XR) 500 MG 24 hr tablet Take 500 mg by mouth 2 (two) times daily.   olmesartan (BENICAR) 40 MG tablet Take 40 mg by mouth daily.   Polyethyl Glyc-Propyl Glyc PF (SYSTANE HYDRATION PF) 0.4-0.3 % SOLN Place 1 drop into both eyes 2 (two) times daily as needed (dry eyes).   Probiotic Product (PROBIOTIC DAILY PO) Take 1 capsule by mouth daily.   psyllium (METAMUCIL) 58.6 % packet Take 1 packet by mouth at bedtime.   rosuvastatin (CRESTOR) 5 MG tablet Take 5 mg by mouth daily.     Allergies:   Amoxicillin, Sulfamethoxazole-trimethoprim,  Ciprofloxacin, Atorvastatin, Clarithromycin, Ezetimibe, Fenofibrate, Hydrochlorothiazide, Niaspan [niacin er], Welchol [colesevelam hcl], Zoster vaccine live, Clindamycin/lincomycin, Keflex [cephalexin], and Metronidazole   Social History   Socioeconomic History   Marital status: Married    Spouse name: Not on file   Number of children: Not on file   Years of education: Not on file   Highest education level: Not on file  Occupational History   Not on file  Tobacco Use   Smoking status: Never   Smokeless tobacco: Never  Vaping Use   Vaping Use: Never used  Substance and Sexual Activity   Alcohol use: Yes    Comment: 1-2 per year   Drug use: No   Sexual activity: Not Currently    Birth control/protection: Post-menopausal  Other Topics Concern   Not on file  Social History Narrative   Lives in [place] with spouse   Retired,  Previously a Materials engineer   Social Determinants of Radio broadcast assistant Strain: Not on file  Food Insecurity: Not on file  Transportation Needs: Not on file  Physical Activity: Not on file  Stress: Not on file  Social Connections: Not on file     Family History: The patient's family history includes Breast cancer in her cousin and niece; Breast cancer (age of onset: 25) in her sister; Colon cancer in her mother; Coronary artery disease in her mother; Heart disease in her mother; Hypertension in her mother; Lung cancer in her father; Other in her brother. There is no history of Ovarian cancer or Endometrial cancer.  ROS:   Please see the history of present illness.    No syncope no bleeding  all other systems reviewed and are negative.  EKGs/Labs/Other Studies Reviewed:    The following studies were reviewed today:  Cardiac CT 02/08/20: IMPRESSION: 1. Coronary calcium score of 0. This was 0 percentile for age and sex matched control.   2. Normal coronary origin with right dominance.   3. No evidence of CAD.   Candee Furbish, MD  [facility]  EGD normal.   EKG:   05/25/21  shows A paced VS at 61 bpm, nonspecific T wave changes.  No change from prior.  Personally reviewed.  Recent Labs: No results found for requested labs within last 365 days.  Recent Lipid Panel No results found for: "CHOL", "TRIG", "HDL", "CHOLHDL", "VLDL", "LDLCALC", "LDLDIRECT"            Physical Exam:    VS:  BP 138/72   Pulse 65   Ht 5' 4.5" (1.638 m)   Wt 181 lb (82.1 kg)   SpO2 98%   BMI 30.59 kg/m     Wt Readings from  Last 3 Encounters:  10/05/22 181 lb (82.1 kg)  07/07/22 179 lb 6.4 oz (81.4 kg)  06/28/22 180 lb 3.2 oz (81.7 kg)     GEN:  Well nourished, well developed in no acute distress HEENT: Normal NECK: No JVD; No carotid bruits LYMPHATICS: No lymphadenopathy CARDIAC: RRR, no murmurs, no rubs, gallops RESPIRATORY:  Clear to auscultation without rales, wheezing or rhonchi  ABDOMEN: Soft, non-tender, non-distended MUSCULOSKELETAL:  No edema; No deformity  SKIN: Warm and dry NEUROLOGIC:  Alert and oriented x 3 PSYCHIATRIC:  Normal affect   ASSESSMENT:    1. Sick sinus syndrome ([place])   2. Essential hypertension, benign   3. Atypical chest pain   4. Hyponatremia     PLAN:    In order of problems listed above:   Sick sinus syndrome ([place]) Normal pacemaker function.  Prior Paceart report reviewed.  EP note from [place] reviewed.  Excellent.  No changes made. Doing well.    Essential hypertension, benign Mildly elevated today 132/70.  Chronic issue.  Avoiding medications such as HCTZ which can cause hyponatremia.  Continuing with olmesartan 40 mg a day only takes Lasix 20 mg as needed.  Continue with checks at home. Used to be on losartan.  Sees Nepho.    Elevated CPK Has had chronically mildly elevated CPK.  Stable.  Currently on low-dose rosuvastatin 5 mg once a day.  No myalgias.  Last LDL 70.  Triglycerides 137 HDL 65 from outside labs.  Hemoglobin 13.4 creatinine 0.9.  Potassium 4.8.  ALT normal. No  changes.    Hyponatremia On 40 oz fluid restriction.  Sees  kidney. Back on salt.  Lasix PRN     Atypical chest pain Stomach pain back pain chest pain.  Thoroughly worked up.  GI evaluation unremarkable.  Coronary CT shows no coronary artery disease, calcium score of 0. Excellent.  Reassurance has been given. Still has at times epigastric discomfort.   Ortho knee pain If needed, replacement would be reasonable from cardiac perspective. She is not interested in surgery at this time.     Medication Adjustments/Labs and Tests Ordered: Current medicines are reviewed at length with the patient today.  Concerns regarding medicines are outlined above.  No orders of the defined types were placed in this encounter.   No orders of the defined types were placed in this encounter.    Patient Instructions  Medication Instructions:  Your physician recommends that you continue on your current medications as directed. Please refer to the Current Medication list given to you today.  *If you need a refill on your cardiac medications before your next appointment, please call your pharmacy*   Lab Work: None ordered.  If you have labs (blood work) drawn today and your tests are completely normal, you will receive your results only by: [place] (if you have MyChart) OR A paper copy in the mail If you have any lab test that is abnormal or we need to change your treatment, we will call you to review the results.   Testing/Procedures: None ordered.    Follow-Up: At [facility], you and your health needs are our priority.  As part of our continuing mission to provide you with exceptional heart care, we have created designated Provider Care Teams.  These Care Teams include your primary Cardiologist (physician) and Advanced Practice Providers (APPs -  Physician Assistants and Nurse Practitioners) who all work together to provide you with the care you need, when you need  it.  We recommend signing up for the patient portal called "MyChart".  Sign up information is provided on this After Visit Summary.  MyChart is used to connect with patients for Virtual Visits (Telemedicine).  Patients are able to view lab/test results, encounter notes, upcoming appointments, etc.  Non-urgent messages can be sent to your provider as well.   To learn more about what you can do with MyChart, go to [email or web address].    Your next appointment:   12 months with Dr Marlou Porch  Important Information About Sugar         Signed, Candee Furbish, MD  10/05/2022 9:20 AM    [place]

## 2022-10-05 NOTE — Patient Instructions (Signed)
Medication Instructions:  Your physician recommends that you continue on your current medications as directed. Please refer to the Current Medication list given to you today.  *If you need a refill on your cardiac medications before your next appointment, please call your pharmacy*   Lab Work: None ordered.  If you have labs (blood work) drawn today and your tests are completely normal, you will receive your results only by: [place] (if you have MyChart) OR A paper copy in the mail If you have any lab test that is abnormal or we need to change your treatment, we will call you to review the results.   Testing/Procedures: None ordered.    Follow-Up: At [facility], you and your health needs are our priority.  As part of our continuing mission to provide you with exceptional heart care, we have created designated Provider Care Teams.  These Care Teams include your primary Cardiologist (physician) and Advanced Practice Providers (APPs -  Physician Assistants and Nurse Practitioners) who all work together to provide you with the care you need, when you need it.  We recommend signing up for the patient portal called "MyChart".  Sign up information is provided on this After Visit Summary.  MyChart is used to connect with patients for Virtual Visits (Telemedicine).  Patients are able to view lab/test results, encounter notes, upcoming appointments, etc.  Non-urgent messages can be sent to your provider as well.   To learn more about what you can do with MyChart, go to [email or web address].    Your next appointment:   12 months with Dr Marlou Porch  Important Information About Sugar

## 2022-10-14 DIAGNOSIS — M25561 Pain in right knee: Secondary | ICD-10-CM | POA: Diagnosis not present

## 2022-10-15 DIAGNOSIS — M25661 Stiffness of right knee, not elsewhere classified: Secondary | ICD-10-CM | POA: Diagnosis not present

## 2022-10-15 DIAGNOSIS — M25561 Pain in right knee: Secondary | ICD-10-CM | POA: Diagnosis not present

## 2022-10-18 DIAGNOSIS — M25561 Pain in right knee: Secondary | ICD-10-CM | POA: Diagnosis not present

## 2022-10-18 DIAGNOSIS — M25661 Stiffness of right knee, not elsewhere classified: Secondary | ICD-10-CM | POA: Diagnosis not present

## 2022-10-27 DIAGNOSIS — M25661 Stiffness of right knee, not elsewhere classified: Secondary | ICD-10-CM | POA: Diagnosis not present

## 2022-10-27 DIAGNOSIS — M25561 Pain in right knee: Secondary | ICD-10-CM | POA: Diagnosis not present

## 2022-11-03 DIAGNOSIS — M25661 Stiffness of right knee, not elsewhere classified: Secondary | ICD-10-CM | POA: Diagnosis not present

## 2022-11-03 DIAGNOSIS — M25561 Pain in right knee: Secondary | ICD-10-CM | POA: Diagnosis not present

## 2022-11-12 ENCOUNTER — Ambulatory Visit
Admission: RE | Admit: 2022-11-12 | Discharge: 2022-11-12 | Disposition: A | Discharge status: Home / Self Care | Payer: Medicare Other | Source: Ambulatory Visit | Attending: Family Medicine | Admitting: Family Medicine

## 2022-11-12 DIAGNOSIS — Z1231 Encounter for screening mammogram for malignant neoplasm of breast: Secondary | ICD-10-CM

## 2022-11-17 DIAGNOSIS — L821 Other seborrheic keratosis: Secondary | ICD-10-CM | POA: Diagnosis not present

## 2022-11-17 DIAGNOSIS — Z08 Encounter for follow-up examination after completed treatment for malignant neoplasm: Secondary | ICD-10-CM | POA: Diagnosis not present

## 2022-11-17 DIAGNOSIS — Z85828 Personal history of other malignant neoplasm of skin: Secondary | ICD-10-CM | POA: Diagnosis not present

## 2022-11-17 DIAGNOSIS — L814 Other melanin hyperpigmentation: Secondary | ICD-10-CM | POA: Diagnosis not present

## 2022-11-17 DIAGNOSIS — D225 Melanocytic nevi of trunk: Secondary | ICD-10-CM | POA: Diagnosis not present

## 2022-11-17 DIAGNOSIS — M71372 Other bursal cyst, left ankle and foot: Secondary | ICD-10-CM | POA: Diagnosis not present

## 2022-11-18 DIAGNOSIS — M25661 Stiffness of right knee, not elsewhere classified: Secondary | ICD-10-CM | POA: Diagnosis not present

## 2022-11-18 DIAGNOSIS — M25561 Pain in right knee: Secondary | ICD-10-CM | POA: Diagnosis not present

## 2022-11-25 ENCOUNTER — Telehealth: Payer: Self-pay

## 2022-11-25 NOTE — Telephone Encounter (Signed)
Spoke with [name] and scheduled her 6 month follow up visit with Dr Delsa Sale on 4/3 at 9:15am.  She prefers morning time appointments.  Patient confirmed and understood.

## 2022-11-29 DIAGNOSIS — R399 Unspecified symptoms and signs involving the genitourinary system: Secondary | ICD-10-CM | POA: Diagnosis not present

## 2022-11-29 DIAGNOSIS — N390 Urinary tract infection, site not specified: Secondary | ICD-10-CM | POA: Diagnosis not present

## 2022-12-02 ENCOUNTER — Ambulatory Visit (INDEPENDENT_AMBULATORY_CARE_PROVIDER_SITE_OTHER): Payer: Medicare Other

## 2022-12-02 DIAGNOSIS — I495 Sick sinus syndrome: Secondary | ICD-10-CM

## 2022-12-03 LAB — CUP PACEART REMOTE DEVICE CHECK
Battery Remaining Longevity: 90 mo
Battery Remaining Percentage: 76 %
Battery Voltage: 3.01 V
Brady Statistic AP VP Percent: 1 %
Brady Statistic AP VS Percent: 93 %
Brady Statistic AS VP Percent: 1 %
Brady Statistic AS VS Percent: 6.8 %
Brady Statistic RA Percent Paced: 91 %
Brady Statistic RV Percent Paced: 1 %
Date Time Interrogation Session: 20240201154715
Implantable Lead Connection Status: 753985
Implantable Lead Connection Status: 753985
Implantable Lead Implant Date: 20210506
Implantable Lead Implant Date: 20210506
Implantable Lead Location: 753859
Implantable Lead Location: 753860
Implantable Pulse Generator Implant Date: 20210506
Lead Channel Impedance Value: 480 Ohm
Lead Channel Impedance Value: 490 Ohm
Lead Channel Pacing Threshold Amplitude: 0.5 V
Lead Channel Pacing Threshold Amplitude: 1 V
Lead Channel Pacing Threshold Pulse Width: 0.5 ms
Lead Channel Pacing Threshold Pulse Width: 0.5 ms
Lead Channel Sensing Intrinsic Amplitude: 1 mV
Lead Channel Sensing Intrinsic Amplitude: 12 mV
Lead Channel Setting Pacing Amplitude: 1.25 V
Lead Channel Setting Pacing Amplitude: 2 V
Lead Channel Setting Pacing Pulse Width: 0.5 ms
Lead Channel Setting Sensing Sensitivity: 2 mV
Pulse Gen Model: 2272
Pulse Gen Serial Number: 3825689

## 2022-12-17 DIAGNOSIS — M25561 Pain in right knee: Secondary | ICD-10-CM | POA: Diagnosis not present

## 2022-12-22 NOTE — Progress Notes (Signed)
Remote pacemaker transmission.   

## 2023-01-12 DIAGNOSIS — Z8542 Personal history of malignant neoplasm of other parts of uterus: Secondary | ICD-10-CM | POA: Diagnosis not present

## 2023-01-12 DIAGNOSIS — E7800 Pure hypercholesterolemia, unspecified: Secondary | ICD-10-CM | POA: Diagnosis not present

## 2023-01-12 DIAGNOSIS — Z0000 Encounter for general adult medical examination without abnormal findings: Secondary | ICD-10-CM | POA: Diagnosis not present

## 2023-01-12 DIAGNOSIS — K219 Gastro-esophageal reflux disease without esophagitis: Secondary | ICD-10-CM | POA: Diagnosis not present

## 2023-01-12 DIAGNOSIS — J0100 Acute maxillary sinusitis, unspecified: Secondary | ICD-10-CM | POA: Diagnosis not present

## 2023-01-12 DIAGNOSIS — R600 Localized edema: Secondary | ICD-10-CM | POA: Diagnosis not present

## 2023-01-12 DIAGNOSIS — I10 Essential (primary) hypertension: Secondary | ICD-10-CM | POA: Diagnosis not present

## 2023-01-12 DIAGNOSIS — R0789 Other chest pain: Secondary | ICD-10-CM | POA: Diagnosis not present

## 2023-01-12 DIAGNOSIS — E039 Hypothyroidism, unspecified: Secondary | ICD-10-CM | POA: Diagnosis not present

## 2023-01-12 DIAGNOSIS — E1165 Type 2 diabetes mellitus with hyperglycemia: Secondary | ICD-10-CM | POA: Diagnosis not present

## 2023-01-12 DIAGNOSIS — Z950 Presence of cardiac pacemaker: Secondary | ICD-10-CM | POA: Diagnosis not present

## 2023-01-31 ENCOUNTER — Encounter: Payer: Self-pay | Admitting: Obstetrics & Gynecology

## 2023-02-01 DIAGNOSIS — H40053 Ocular hypertension, bilateral: Secondary | ICD-10-CM | POA: Diagnosis not present

## 2023-02-01 DIAGNOSIS — H02831 Dermatochalasis of right upper eyelid: Secondary | ICD-10-CM | POA: Diagnosis not present

## 2023-02-01 DIAGNOSIS — H2513 Age-related nuclear cataract, bilateral: Secondary | ICD-10-CM | POA: Diagnosis not present

## 2023-02-01 DIAGNOSIS — H02834 Dermatochalasis of left upper eyelid: Secondary | ICD-10-CM | POA: Diagnosis not present

## 2023-02-02 ENCOUNTER — Encounter: Payer: Self-pay | Admitting: Obstetrics & Gynecology

## 2023-02-02 ENCOUNTER — Inpatient Hospital Stay: Payer: Medicare Other | Attending: Obstetrics & Gynecology | Admitting: Obstetrics & Gynecology

## 2023-02-02 VITALS — BP 139/78 | HR 77 | Temp 97.8°F | Resp 16 | Ht 64.0 in | Wt 178.0 lb

## 2023-02-02 DIAGNOSIS — C541 Malignant neoplasm of endometrium: Secondary | ICD-10-CM

## 2023-02-02 DIAGNOSIS — Z8542 Personal history of malignant neoplasm of other parts of uterus: Secondary | ICD-10-CM

## 2023-02-02 NOTE — Progress Notes (Signed)
Follow Up Note: Gyn-Onc  [name] 77 y.o. female  CC: She presents for a f/u visit   HPI: The oncology history was reviewed.  Interval History: She denies any vaginal bleeding, abdominal/pelvic pain, cough, lethargy or increasing abdominal girth. H/O IBS w/alternating constipation/diarrhea.  H/O rUTI--recently treated for a UTI.  Currently SA.   Review of Systems  Review of Systems  Constitutional:  Negative for malaise/fatigue and weight loss.  Respiratory:  Negative for shortness of breath and wheezing.   Cardiovascular:  Negative for chest pain and leg swelling.  Gastrointestinal:  Negative for abdominal pain, blood in stool, constipation, nausea and vomiting.  Genitourinary:  Negative for dysuria, frequency, hematuria and urgency.  Musculoskeletal:  Negative for joint pain and myalgias.  Neurological:  Negative for weakness.  Psychiatric/Behavioral:  Negative for depression. The patient does not have insomnia.    Current medications, allergy, social history, past surgical history, past medical history, family history were all reviewed.    Vitals:  BP 139/78 (BP Location: Left Arm, Patient Position: Sitting)   Pulse 77   Temp 97.8 F (36.6 C) (Oral)   Resp 16   Ht 5\' 4"  (1.626 m)   Wt 178 lb (80.7 kg)   SpO2 99%   BMI 30.55 kg/m     Physical Exam Exam conducted with a chaperone present.  Constitutional:      General: She is not in acute distress. Cardiovascular:     Rate and Rhythm: Normal rate and regular rhythm.  Pulmonary:     Effort: Pulmonary effort is normal.     Breath sounds: Normal breath sounds. No wheezing or rhonchi.  Abdominal:     Palpations: Abdomen is soft.     Tenderness: There is no abdominal tenderness. There is no right CVA tenderness or left CVA tenderness.     Hernia: No hernia is present.  Genitourinary:    General: Normal vulva. Mild erythema    Urethra: No urethral lesion.     Vagina: No lesions. No bleeding.  Synechiae at the  apex Musculoskeletal:     Cervical back: Neck supple.     Right lower leg: No edema.     Left lower leg: No edema.  Lymphadenopathy:     Upper Body:     Right upper body: No supraclavicular adenopathy.     Left upper body: No supraclavicular adenopathy.     Lower Body: No right inguinal adenopathy. No left inguinal adenopathy.  Skin:    Findings: No rash.  Neurological:     Mental Status: She is oriented to person, place, and time.   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  No evidence of recurrence  >continue follow-up every 6 mos for 5 yrs in accordance w/NCCN guidelines    I personally spent 25 minutes face-to-face and non-face-to-face in the care of this patient, which includes all pre, intra, and post visit time on the date of service.    Lahoma Crocker, MD

## 2023-02-02 NOTE — Patient Instructions (Addendum)
Return in 6 months. Please call at the end of June to schedule for September

## 2023-02-02 NOTE — Assessment & Plan Note (Signed)
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  No evidence of recurrence  >continue follow-up every 6 mos for 5 yrs in accordance w/NCCN guidelines

## 2023-03-03 ENCOUNTER — Ambulatory Visit (INDEPENDENT_AMBULATORY_CARE_PROVIDER_SITE_OTHER): Payer: Medicare Other

## 2023-03-03 DIAGNOSIS — I495 Sick sinus syndrome: Secondary | ICD-10-CM | POA: Diagnosis not present

## 2023-03-03 LAB — CUP PACEART REMOTE DEVICE CHECK
Battery Remaining Longevity: 87 mo
Battery Remaining Percentage: 74 %
Battery Voltage: 3.01 V
Brady Statistic AP VP Percent: 1 %
Brady Statistic AP VS Percent: 94 %
Brady Statistic AS VP Percent: 1 %
Brady Statistic AS VS Percent: 5.4 %
Brady Statistic RA Percent Paced: 92 %
Brady Statistic RV Percent Paced: 1 %
Date Time Interrogation Session: 20240502042033
Implantable Lead Connection Status: 753985
Implantable Lead Connection Status: 753985
Implantable Lead Implant Date: 20210506
Implantable Lead Implant Date: 20210506
Implantable Lead Location: 753859
Implantable Lead Location: 753860
Implantable Pulse Generator Implant Date: 20210506
Lead Channel Impedance Value: 460 Ohm
Lead Channel Impedance Value: 490 Ohm
Lead Channel Pacing Threshold Amplitude: 0.5 V
Lead Channel Pacing Threshold Amplitude: 1 V
Lead Channel Pacing Threshold Pulse Width: 0.5 ms
Lead Channel Pacing Threshold Pulse Width: 0.5 ms
Lead Channel Sensing Intrinsic Amplitude: 1.6 mV
Lead Channel Sensing Intrinsic Amplitude: 11.1 mV
Lead Channel Setting Pacing Amplitude: 1.25 V
Lead Channel Setting Pacing Amplitude: 2 V
Lead Channel Setting Pacing Pulse Width: 0.5 ms
Lead Channel Setting Sensing Sensitivity: 2 mV
Pulse Gen Model: 2272
Pulse Gen Serial Number: 3825689

## 2023-03-23 NOTE — Progress Notes (Signed)
Remote pacemaker transmission.   

## 2023-05-02 ENCOUNTER — Telehealth: Payer: Self-pay | Admitting: *Deleted

## 2023-05-02 NOTE — Telephone Encounter (Signed)
Spoke with [name] who called the office to schedule a  6 month follow up with Dr. Tamela Oddi.  Patient was scheduled an appointment for Wednesday October 9 th at 0900 with 0845 check in. Patient agreed to date and time and no further concerns or questions at this time.

## 2023-05-10 ENCOUNTER — Ambulatory Visit (HOSPITAL_COMMUNITY)
Admission: RE | Admit: 2023-05-10 | Discharge: 2023-05-10 | Disposition: A | Discharge status: Home / Self Care | Payer: Medicare Other | Source: Ambulatory Visit | Attending: Vascular Surgery | Admitting: Vascular Surgery

## 2023-05-10 ENCOUNTER — Ambulatory Visit
Admission: RE | Admit: 2023-05-10 | Discharge: 2023-05-10 | Disposition: A | Discharge status: Home / Self Care | Payer: Medicare Other | Source: Ambulatory Visit | Attending: Physician Assistant | Admitting: Physician Assistant

## 2023-05-10 ENCOUNTER — Other Ambulatory Visit: Payer: Self-pay | Admitting: Physician Assistant

## 2023-05-10 ENCOUNTER — Other Ambulatory Visit (HOSPITAL_COMMUNITY): Payer: Self-pay | Admitting: Physician Assistant

## 2023-05-10 DIAGNOSIS — M7989 Other specified soft tissue disorders: Secondary | ICD-10-CM | POA: Insufficient documentation

## 2023-05-10 DIAGNOSIS — W19XXXA Unspecified fall, initial encounter: Secondary | ICD-10-CM

## 2023-05-10 DIAGNOSIS — G479 Sleep disorder, unspecified: Secondary | ICD-10-CM | POA: Diagnosis not present

## 2023-05-10 DIAGNOSIS — M25551 Pain in right hip: Secondary | ICD-10-CM | POA: Diagnosis not present

## 2023-05-10 DIAGNOSIS — R600 Localized edema: Secondary | ICD-10-CM | POA: Diagnosis not present

## 2023-05-24 DIAGNOSIS — N390 Urinary tract infection, site not specified: Secondary | ICD-10-CM | POA: Diagnosis not present

## 2023-06-02 ENCOUNTER — Ambulatory Visit (INDEPENDENT_AMBULATORY_CARE_PROVIDER_SITE_OTHER): Payer: Medicare Other

## 2023-06-02 DIAGNOSIS — I495 Sick sinus syndrome: Secondary | ICD-10-CM

## 2023-06-02 LAB — CUP PACEART REMOTE DEVICE CHECK
Battery Remaining Longevity: 85 mo
Battery Remaining Percentage: 71 %
Battery Voltage: 3.01 V
Brady Statistic AP VP Percent: 1 %
Brady Statistic AP VS Percent: 95 %
Brady Statistic AS VP Percent: 1 %
Brady Statistic AS VS Percent: 4.2 %
Brady Statistic RA Percent Paced: 94 %
Brady Statistic RV Percent Paced: 1 %
Date Time Interrogation Session: 20240801045835
Implantable Lead Connection Status: 753985
Implantable Lead Connection Status: 753985
Implantable Lead Implant Date: 20210506
Implantable Lead Implant Date: 20210506
Implantable Lead Location: 753859
Implantable Lead Location: 753860
Implantable Pulse Generator Implant Date: 20210506
Lead Channel Impedance Value: 460 Ohm
Lead Channel Impedance Value: 510 Ohm
Lead Channel Pacing Threshold Amplitude: 0.5 V
Lead Channel Pacing Threshold Amplitude: 0.875 V
Lead Channel Pacing Threshold Pulse Width: 0.5 ms
Lead Channel Pacing Threshold Pulse Width: 0.5 ms
Lead Channel Sensing Intrinsic Amplitude: 1.7 mV
Lead Channel Sensing Intrinsic Amplitude: 12 mV
Lead Channel Setting Pacing Amplitude: 1.125
Lead Channel Setting Pacing Amplitude: 2 V
Lead Channel Setting Pacing Pulse Width: 0.5 ms
Lead Channel Setting Sensing Sensitivity: 2 mV
Pulse Gen Model: 2272
Pulse Gen Serial Number: 3825689

## 2023-06-07 DIAGNOSIS — R399 Unspecified symptoms and signs involving the genitourinary system: Secondary | ICD-10-CM | POA: Diagnosis not present

## 2023-06-07 DIAGNOSIS — R350 Frequency of micturition: Secondary | ICD-10-CM | POA: Diagnosis not present

## 2023-06-15 NOTE — Progress Notes (Signed)
Remote pacemaker transmission.   

## 2023-07-02 DIAGNOSIS — N3001 Acute cystitis with hematuria: Secondary | ICD-10-CM | POA: Diagnosis not present

## 2023-07-08 ENCOUNTER — Ambulatory Visit: Payer: Medicare Other | Admitting: Cardiology

## 2023-07-12 DIAGNOSIS — N3020 Other chronic cystitis without hematuria: Secondary | ICD-10-CM | POA: Diagnosis not present

## 2023-07-28 DIAGNOSIS — E1165 Type 2 diabetes mellitus with hyperglycemia: Secondary | ICD-10-CM | POA: Diagnosis not present

## 2023-07-28 DIAGNOSIS — R350 Frequency of micturition: Secondary | ICD-10-CM | POA: Diagnosis not present

## 2023-07-28 DIAGNOSIS — Z23 Encounter for immunization: Secondary | ICD-10-CM | POA: Diagnosis not present

## 2023-07-28 DIAGNOSIS — K219 Gastro-esophageal reflux disease without esophagitis: Secondary | ICD-10-CM | POA: Diagnosis not present

## 2023-07-28 DIAGNOSIS — I10 Essential (primary) hypertension: Secondary | ICD-10-CM | POA: Diagnosis not present

## 2023-07-28 DIAGNOSIS — E871 Hypo-osmolality and hyponatremia: Secondary | ICD-10-CM | POA: Diagnosis not present

## 2023-07-28 DIAGNOSIS — Z8542 Personal history of malignant neoplasm of other parts of uterus: Secondary | ICD-10-CM | POA: Diagnosis not present

## 2023-07-28 DIAGNOSIS — Z950 Presence of cardiac pacemaker: Secondary | ICD-10-CM | POA: Diagnosis not present

## 2023-07-28 DIAGNOSIS — R600 Localized edema: Secondary | ICD-10-CM | POA: Diagnosis not present

## 2023-07-28 DIAGNOSIS — E7800 Pure hypercholesterolemia, unspecified: Secondary | ICD-10-CM | POA: Diagnosis not present

## 2023-07-28 DIAGNOSIS — E039 Hypothyroidism, unspecified: Secondary | ICD-10-CM | POA: Diagnosis not present

## 2023-07-28 LAB — LAB REPORT - SCANNED
A1c: 6.8
EGFR: 52

## 2023-08-02 DIAGNOSIS — H31002 Unspecified chorioretinal scars, left eye: Secondary | ICD-10-CM | POA: Diagnosis not present

## 2023-08-02 DIAGNOSIS — E119 Type 2 diabetes mellitus without complications: Secondary | ICD-10-CM | POA: Diagnosis not present

## 2023-08-02 DIAGNOSIS — H2513 Age-related nuclear cataract, bilateral: Secondary | ICD-10-CM | POA: Diagnosis not present

## 2023-08-02 DIAGNOSIS — H5203 Hypermetropia, bilateral: Secondary | ICD-10-CM | POA: Diagnosis not present

## 2023-08-04 NOTE — Progress Notes (Signed)
Electrophysiology Office Note:   ID:  Molly, Mccoy July 12, 1946, MRN [id number]  Primary Cardiologist: Donato Schultz, MD Electrophysiologist: Will Jorja Loa, MD      History of Present Illness:   [name] is a 77 y.o. female with h/o symptomatic bradycardia s/p PPM, HTN, and HLD seen today for routine electrophysiology followup.   Since last being seen in our clinic the patient reports doing very well. Has mild peripheral edema with better control on increased prn lasix. She denies chest pain, palpitations, dyspnea, PND, orthopnea, nausea, vomiting, dizziness, syncope, weight gain, or early satiety.   Review of systems complete and found to be negative unless listed in HPI.   EP Information / Studies Reviewed:    EKG is ordered today. Personal review as below.  EKG Interpretation Date/Time:  Friday August 05 2023 08:07:58 EDT Ventricular Rate:  66 PR Interval:  178 QRS Duration:  100 QT Interval:  436 QTC Calculation: 457 R Axis:   -60  Text Interpretation: Atrial-paced rhythm Left axis deviation Confirmed by Maxine Glenn [phone]) on 08/05/2023 8:12:42 AM    PPM Interrogation-  reviewed in detail today,  See PACEART report.  Device History: St. 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   Physical Exam:   VS:  BP (!) 150/90   Pulse 66   Ht 5\' 4"  (1.626 m)   Wt 175 lb 9.6 oz (79.7 kg)   SpO2 99%   BMI 30.14 kg/m    Wt Readings from Last 3 Encounters:  08/05/23 175 lb 9.6 oz (79.7 kg)  02/02/23 178 lb (80.7 kg)  10/05/22 181 lb (82.1 kg)     GEN: Well nourished, well developed in no acute distress NECK: No JVD; No carotid bruits CARDIAC: Regular rate and rhythm, no murmurs, rubs, gallops RESPIRATORY:  Clear to auscultation without rales, wheezing or rhonchi  ABDOMEN: Soft, non-tender, non-distended EXTREMITIES:  No edema; No deformity   ASSESSMENT AND PLAN:    Symptomatic bradycardia s/p Abbott PPM  Normal PPM  function See Pace Art report No changes today  HTN Stable on current regimen   HLD Continue statin    Disposition:   Follow up with Dr. Elberta Fortis in 12 months  Signed, Graciella Freer, PA-C

## 2023-08-05 ENCOUNTER — Encounter: Payer: Self-pay | Admitting: Student

## 2023-08-05 ENCOUNTER — Ambulatory Visit: Payer: Medicare Other | Attending: Cardiology | Admitting: Student

## 2023-08-05 VITALS — BP 150/90 | HR 66 | Ht 64.0 in | Wt 175.6 lb

## 2023-08-05 DIAGNOSIS — I10 Essential (primary) hypertension: Secondary | ICD-10-CM | POA: Diagnosis not present

## 2023-08-05 DIAGNOSIS — I495 Sick sinus syndrome: Secondary | ICD-10-CM | POA: Diagnosis not present

## 2023-08-05 DIAGNOSIS — E785 Hyperlipidemia, unspecified: Secondary | ICD-10-CM | POA: Diagnosis not present

## 2023-08-05 LAB — CUP PACEART INCLINIC DEVICE CHECK
Battery Remaining Longevity: 84 mo
Battery Voltage: 3.01 V
Brady Statistic RA Percent Paced: 94 %
Brady Statistic RV Percent Paced: 0.17 %
Date Time Interrogation Session: 20241004082651
Implantable Lead Connection Status: 753985
Implantable Lead Connection Status: 753985
Implantable Lead Implant Date: 20210506
Implantable Lead Implant Date: 20210506
Implantable Lead Location: 753859
Implantable Lead Location: 753860
Implantable Pulse Generator Implant Date: 20210506
Lead Channel Impedance Value: 462.5 Ohm
Lead Channel Impedance Value: 462.5 Ohm
Lead Channel Pacing Threshold Amplitude: 0.5 V
Lead Channel Pacing Threshold Amplitude: 0.5 V
Lead Channel Pacing Threshold Amplitude: 1 V
Lead Channel Pacing Threshold Pulse Width: 0.5 ms
Lead Channel Pacing Threshold Pulse Width: 0.5 ms
Lead Channel Pacing Threshold Pulse Width: 0.5 ms
Lead Channel Sensing Intrinsic Amplitude: 0.7 mV
Lead Channel Sensing Intrinsic Amplitude: 12 mV
Lead Channel Setting Pacing Amplitude: 1.25 V
Lead Channel Setting Pacing Amplitude: 2 V
Lead Channel Setting Pacing Pulse Width: 0.5 ms
Lead Channel Setting Sensing Sensitivity: 2 mV
Pulse Gen Model: 2272
Pulse Gen Serial Number: 3825689

## 2023-08-05 NOTE — Patient Instructions (Signed)
Medication Instructions:  Your physician recommends that you continue on your current medications as directed. Please refer to the Current Medication list given to you today.  *If you need a refill on your cardiac medications before your next appointment, please call your pharmacy*  Lab Work: None ordered If you have labs (blood work) drawn today and your tests are completely normal, you will receive your results only by: MyChart Message (if you have MyChart) OR A paper copy in the mail If you have any lab test that is abnormal or we need to change your treatment, we will call you to review the results.  Follow-Up: At [facility], you and your health needs are our priority.  As part of our continuing mission to provide you with exceptional heart care, we have created designated Provider Care Teams.  These Care Teams include your primary Cardiologist (physician) and Advanced Practice Providers (APPs -  Physician Assistants and Nurse Practitioners) who all work together to provide you with the care you need, when you need it.  Your next appointment:   1 year(s)  Provider:   Will Camnitz, MD  

## 2023-08-10 ENCOUNTER — Encounter: Payer: Self-pay | Admitting: Obstetrics & Gynecology

## 2023-08-10 ENCOUNTER — Inpatient Hospital Stay: Payer: Medicare Other | Attending: Obstetrics & Gynecology | Admitting: Obstetrics & Gynecology

## 2023-08-10 ENCOUNTER — Inpatient Hospital Stay: Payer: Medicare Other

## 2023-08-10 VITALS — BP 150/60 | HR 60 | Temp 98.0°F | Wt 174.8 lb

## 2023-08-10 DIAGNOSIS — Z8542 Personal history of malignant neoplasm of other parts of uterus: Secondary | ICD-10-CM | POA: Diagnosis not present

## 2023-08-10 DIAGNOSIS — C541 Malignant neoplasm of endometrium: Secondary | ICD-10-CM

## 2023-08-10 DIAGNOSIS — L292 Pruritus vulvae: Secondary | ICD-10-CM | POA: Diagnosis not present

## 2023-08-10 LAB — WET PREP, GENITAL
Clue Cells Wet Prep HPF POC: NONE SEEN
Sperm: NONE SEEN
Trich, Wet Prep: NONE SEEN
WBC, Wet Prep HPF POC: <10 (ref <10)
Yeast Wet Prep HPF POC: NONE SEEN

## 2023-08-10 NOTE — Patient Instructions (Signed)
Return in 6 months  Healthy vulval hygiene practices Avoid Substitute  Clothing  Pantyhose Stockings with a garter belt Thigh-high or knee-high stockings   Synthetic underwear Cotton underwear or no underwear  Jeans and other tight pants Loose pants, skirts, dresses  Swimsuits, leotards, thongs, lycra garments Loose-fitting cotton garments  Cleansing products  Scented soaps or shampoos Fragrance-free pH neutral soap  Bubble bath Tub baths in the morning and at night without additives and at a comfortable temperature  Scented detergents Unscented detergents  Baby wipes or flushable wipes Rinse with water using sports water bottle or perineal irrigation bottle  Feminine sprays, douches, powders These are not necessary products and can be omitted from personal practices  Other  Washcloths Use fingertips for washing; pat dry, do not rub dry  Panty liners Tampons or cotton pads  Dyed toilet articles Toilet articles without dyes  Hair dryers to dry vulva skin without contact Dry vulva by gentle patting  Vulvar/Vaginal Moisturizers  Moisturizer Options: Vitamin E oil: pump or capsule form Vitamin E cream (Gene's vitamin E cream) Coconut oil: bottle or bead form Shea butter Blossom Organic Lubricant (organic and all natural; www.blossomorganics.com) PE suppository(coconut oil/vitamin E/palm oil) [employer]vest Aloe Glide      Consider the ingredients of the product - the fewer the ingredients the better!  Directions for Use: Clean and dry your hands Gently dab the vulvar/vaginal area dry as needed Apply a "pea-sized" amount of the moisturizer onto your fingertip Using you other hand, open the labia   Apply the moisturizer to the vulvar/vaginal tissues Wear loose fitting underwear/clothing if possible following application  Use moisturize 2-3 times daily as desired.

## 2023-08-10 NOTE — Progress Notes (Signed)
Follow Up Note: Gyn-Onc  [name] 77 y.o. female  CC: She presents for a f/u visit   HPI: The oncology history was reviewed.  Interval History: She denies any vaginal bleeding, abdominal/pelvic pain, cough, lethargy or increasing abdominal girth. H/O IBS w/alternating constipation/diarrhea.  C/O new onset vaginal itching.  No associated discharge.  Hygiene practices includes wipes.    Review of Systems  Review of Systems  Constitutional:  Negative for malaise/fatigue and weight loss.  Respiratory:  Negative for shortness of breath and wheezing.   Cardiovascular:  Negative for chest pain and leg swelling.  Gastrointestinal:  Negative for abdominal pain, blood in stool, constipation, nausea and vomiting.  Genitourinary:  See above  Musculoskeletal:  Negative for joint pain and myalgias.  Neurological:  Negative for weakness.  Psychiatric/Behavioral:  Negative for depression. The patient does not have insomnia.    Current medications, allergy, social history, past surgical history, past medical history, family history were all reviewed.    Vitals: BP (!) 150/60 (BP Location: Left Arm, Patient Position: Sitting) Comment: 1st BP 150/60; 2nd BP 149/56; patient states this is normal for her  Pulse 60   Temp 98 F (36.7 C) (Oral)   Wt 174 lb 12.8 oz (79.3 kg)   SpO2 100%   BMI 30.00 kg/m     Physical Exam Exam conducted with a chaperone present.  Constitutional:      General: She is not in acute distress. Cardiovascular:     Rate and Rhythm: Normal rate and regular rhythm.  Pulmonary:     Effort: Pulmonary effort is normal.     Breath sounds: Normal breath sounds. No wheezing or rhonchi.  Abdominal:     Palpations: Abdomen is soft.     Tenderness: There is no abdominal tenderness. There is no right CVA tenderness or left CVA tenderness.     Hernia: No hernia is present.  Genitourinary:    General: Normal vulva. Mild erythema. Scant white discharge    Urethra: No  urethral lesion.     Vagina: No lesions. No bleeding.  Synechiae at the apex Musculoskeletal:     Cervical back: Neck supple.     Right lower leg: No edema.     Left lower leg: No edema.  Lymphadenopathy:     Upper Body:     Right upper body: No supraclavicular adenopathy.     Left upper body: No supraclavicular adenopathy.     Lower Body: No right inguinal adenopathy. No left inguinal adenopathy.  Skin:    Findings: No rash.  Neurological:     Mental Status: She is oriented to person, place, and time.   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  No evidence of recurrence Suspect irritant dermatitis 2/2 baby wipes  >review the wet prep findings  >optimize hygiene practices; counseled re: moisturizers--education materials provided >continue follow-up every 6 mos for 5 yrs in accordance w/NCCN guidelines      I personally spent 25 minutes face-to-face and non-face-to-face in the care of this patient, which includes all pre, intra, and post visit time on the date of service.    Antionette Char, MD

## 2023-08-10 NOTE — Assessment & Plan Note (Addendum)
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  No evidence of recurrence Suspect irritant dermatitis 2/2 baby wipes  >review the wet prep findings  >optimize hygiene practices; counseled re: moisturizers--education materials provided >continue follow-up every 6 mos for 5 yrs in accordance w/NCCN guidelines

## 2023-08-15 DIAGNOSIS — R350 Frequency of micturition: Secondary | ICD-10-CM | POA: Diagnosis not present

## 2023-08-15 DIAGNOSIS — R8290 Unspecified abnormal findings in urine: Secondary | ICD-10-CM | POA: Diagnosis not present

## 2023-08-15 DIAGNOSIS — R300 Dysuria: Secondary | ICD-10-CM | POA: Diagnosis not present

## 2023-08-16 ENCOUNTER — Telehealth: Payer: Self-pay

## 2023-08-16 NOTE — Telephone Encounter (Signed)
Pt is aware of negative wet prep results from visit with Dr. Tamela Oddi

## 2023-08-16 NOTE — Telephone Encounter (Signed)
-----   Message from [name] sent at 08/16/2023 12:34 PM EDT ----- Dr. Tina Griffiths did a wet prep when she was here last. Please let her know this was normal.

## 2023-09-01 ENCOUNTER — Ambulatory Visit (INDEPENDENT_AMBULATORY_CARE_PROVIDER_SITE_OTHER): Payer: Medicare Other

## 2023-09-01 DIAGNOSIS — I495 Sick sinus syndrome: Secondary | ICD-10-CM | POA: Diagnosis not present

## 2023-09-01 LAB — CUP PACEART REMOTE DEVICE CHECK
Battery Remaining Longevity: 81 mo
Battery Remaining Percentage: 69 %
Battery Voltage: 3.01 V
Brady Statistic AP VP Percent: 1 %
Brady Statistic AP VS Percent: 97 %
Brady Statistic AS VP Percent: 1 %
Brady Statistic AS VS Percent: 2.6 %
Brady Statistic RA Percent Paced: 96 %
Brady Statistic RV Percent Paced: 1 %
Date Time Interrogation Session: 20241031040014
Implantable Lead Connection Status: 753985
Implantable Lead Connection Status: 753985
Implantable Lead Implant Date: 20210506
Implantable Lead Implant Date: 20210506
Implantable Lead Location: 753859
Implantable Lead Location: 753860
Implantable Pulse Generator Implant Date: 20210506
Lead Channel Impedance Value: 450 Ohm
Lead Channel Impedance Value: 490 Ohm
Lead Channel Pacing Threshold Amplitude: 0.5 V
Lead Channel Pacing Threshold Amplitude: 1 V
Lead Channel Pacing Threshold Pulse Width: 0.5 ms
Lead Channel Pacing Threshold Pulse Width: 0.5 ms
Lead Channel Sensing Intrinsic Amplitude: 11.5 mV
Lead Channel Sensing Intrinsic Amplitude: 2.2 mV
Lead Channel Setting Pacing Amplitude: 1.25 V
Lead Channel Setting Pacing Amplitude: 2 V
Lead Channel Setting Pacing Pulse Width: 0.5 ms
Lead Channel Setting Sensing Sensitivity: 2 mV
Pulse Gen Model: 2272
Pulse Gen Serial Number: 3825689

## 2023-09-14 NOTE — Progress Notes (Signed)
Remote pacemaker transmission.   

## 2023-10-06 ENCOUNTER — Ambulatory Visit: Payer: Medicare Other | Attending: Cardiology | Admitting: Cardiology

## 2023-10-06 ENCOUNTER — Encounter: Payer: Self-pay | Admitting: Cardiology

## 2023-10-06 VITALS — BP 152/72 | HR 60 | Ht 64.0 in | Wt 176.2 lb

## 2023-10-06 DIAGNOSIS — Z950 Presence of cardiac pacemaker: Secondary | ICD-10-CM | POA: Insufficient documentation

## 2023-10-06 DIAGNOSIS — I495 Sick sinus syndrome: Secondary | ICD-10-CM | POA: Insufficient documentation

## 2023-10-06 NOTE — Progress Notes (Signed)
Cardiology Office Note:  .   Date:  10/06/2023  ID:  [name], DOB Jan 12, 1946, MRN [id number] PCP: Molly Retort, MD  [facility] Providers Cardiologist:  Donato Schultz, MD Electrophysiologist:  Will Jorja Loa, MD     History of Present Illness: .   [name] is a 77 y.o. female Discussed with the use of AI scribe   History of Present Illness   The patient is a 77 year old with a history of symptomatic bradycardia, hypertension, and hyperlipidemia, who had a St. 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 She also has a history of chronic SIADH with hyponatremia, with a serum sodium level as low as 126 at one point. A coronary CT scan performed in April 2021 showed no CAD with a calcium score of zero. The patient has a chronically mildly elevated CPK and is currently on Latisse and Crestor, with an LDL of 49.  The patient reports no issues with her heart, but has been experiencing discomfort in her sides, which she suspects may be related to her kidney function. She notes that her kidney function is not as high as it should be, with a recent estimated GFR of 52. She also reports that her sodium levels have been a little low, but checked out at 132 in her most recent test.  The patient has been experiencing pains in her sides most of the time, which she suspects could be related to her kidneys, bowels, or muscles. She notes that these pains started after a urinary tract infection in August and have been bothering her ever since. She also reports urinary frequency, but no urgency or burning.  The patient's pacemaker appears to be working well, with no episodes reported. She also reports some problems with her knees, which have limited her mobility and exercise.           Studies Reviewed: .        Results   LABS Serum sodium: 133 CPK: chronically mildly elevated LDL: 49 Creatinine: 1.1 Estimated GFR: 52  RADIOLOGY Coronary CT scan: No CAD,  calcium score of 0 (02/08/2020)  DIAGNOSTIC EKG: Stable     Risk Assessment/Calculations:           Physical Exam:   VS:  BP (!) 152/72   Pulse 60   Ht 5\' 4"  (1.626 m)   Wt 176 lb 3.2 oz (79.9 kg)   SpO2 99%   BMI 30.24 kg/m    Wt Readings from Last 3 Encounters:  10/06/23 176 lb 3.2 oz (79.9 kg)  08/10/23 174 lb 12.8 oz (79.3 kg)  08/05/23 175 lb 9.6 oz (79.7 kg)    GEN: Well nourished, well developed in no acute distress NECK: No JVD; No carotid bruits CARDIAC: RRR, no murmurs, no rubs, no gallops RESPIRATORY:  Clear to auscultation without rales, wheezing or rhonchi  ABDOMEN: Soft, non-tender, non-distended EXTREMITIES:  No edema; No deformity   ASSESSMENT AND PLAN: .    Assessment and Plan    Symptomatic Bradycardia Status Post Pacemaker St. 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 Pacemaker functioning well with no reported episodes. Patient unsure what constitutes an episode. Pacemaker checks every three months. No acute concerns noted. - Continue current pacemaker management - Follow up with pacemaker clinic as scheduled - ECG with atrial pacing  Chronic Kidney Disease Estimated GFR of 52, creatinine of 1.1. Reports side pain, possibly muscular, bowel-related, or kidney-related. No signs of acute infection or fever. Reports frequency  without urgency or burning, suggesting no current UTI. - Monitor kidney function - Consider further evaluation if symptoms persist or worsen  Hypertension Well-controlled. No changes in management discussed. - Continue current antihypertensive regimen  Hyperlipidemia On Crestor with an LDL of 49. Condition well-managed. - Continue Crestor  Chronic SIADH with Hyponatremia Previously had serum sodium of 126, currently at 133. Condition stable. - Continue monitoring sodium levels  Chronic Mildly Elevated CPK Chronic mildly elevated CPK noted. No acute concerns discussed. - Continue monitoring CPK levels  General  Health Maintenance Reports decreased physical activity due to knee problems. Encouraged to resume walking in small increments to improve overall health. - Encourage gradual increase in physical activity - Monitor knee pain and consider further evaluation if it persists  Follow-up - Schedule annual follow-up visit - Continue regular pacemaker checks.               Signed, Donato Schultz, MD

## 2023-10-06 NOTE — Patient Instructions (Signed)
Medication Instructions:  Your physician recommends that you continue on your current medications as directed. Please refer to the Current Medication list given to you today.  *If you need a refill on your cardiac medications before your next appointment, please call your pharmacy*  Lab Work: None ordered today.  Testing/Procedures: None ordered today.  Follow-Up: At [facility], you and your health needs are our priority.  As part of our continuing mission to provide you with exceptional heart care, we have created designated Provider Care Teams.  These Care Teams include your primary Cardiologist (physician) and Advanced Practice Providers (APPs -  Physician Assistants and Nurse Practitioners) who all work together to provide you with the care you need, when you need it.  Your next appointment:   1 year(s)  The format for your next appointment:   In Person  Provider:   Donato Schultz, MD {

## 2023-10-07 ENCOUNTER — Other Ambulatory Visit: Payer: Self-pay | Admitting: Family Medicine

## 2023-10-07 DIAGNOSIS — Z1231 Encounter for screening mammogram for malignant neoplasm of breast: Secondary | ICD-10-CM

## 2023-10-27 DIAGNOSIS — N390 Urinary tract infection, site not specified: Secondary | ICD-10-CM | POA: Diagnosis not present

## 2023-11-15 ENCOUNTER — Ambulatory Visit
Admission: RE | Admit: 2023-11-15 | Discharge: 2023-11-15 | Disposition: A | Discharge status: Home / Self Care | Payer: Medicare Other | Source: Ambulatory Visit

## 2023-11-15 DIAGNOSIS — Z1231 Encounter for screening mammogram for malignant neoplasm of breast: Secondary | ICD-10-CM

## 2023-11-21 DIAGNOSIS — Z85828 Personal history of other malignant neoplasm of skin: Secondary | ICD-10-CM | POA: Diagnosis not present

## 2023-11-21 DIAGNOSIS — D225 Melanocytic nevi of trunk: Secondary | ICD-10-CM | POA: Diagnosis not present

## 2023-11-21 DIAGNOSIS — L821 Other seborrheic keratosis: Secondary | ICD-10-CM | POA: Diagnosis not present

## 2023-11-21 DIAGNOSIS — Z08 Encounter for follow-up examination after completed treatment for malignant neoplasm: Secondary | ICD-10-CM | POA: Diagnosis not present

## 2023-11-21 DIAGNOSIS — L570 Actinic keratosis: Secondary | ICD-10-CM | POA: Diagnosis not present

## 2023-11-21 DIAGNOSIS — L814 Other melanin hyperpigmentation: Secondary | ICD-10-CM | POA: Diagnosis not present

## 2023-12-01 ENCOUNTER — Ambulatory Visit (INDEPENDENT_AMBULATORY_CARE_PROVIDER_SITE_OTHER): Payer: TRICARE For Life (TFL)

## 2023-12-01 DIAGNOSIS — I495 Sick sinus syndrome: Secondary | ICD-10-CM

## 2023-12-01 LAB — CUP PACEART REMOTE DEVICE CHECK
Battery Remaining Longevity: 78 mo
Battery Remaining Percentage: 67 %
Battery Voltage: 3.01 V
Brady Statistic AP VP Percent: 1 %
Brady Statistic AP VS Percent: 97 %
Brady Statistic AS VP Percent: 1 %
Brady Statistic AS VS Percent: 2.3 %
Brady Statistic RA Percent Paced: 96 %
Brady Statistic RV Percent Paced: 1 %
Date Time Interrogation Session: 20250130040029
Implantable Lead Connection Status: 753985
Implantable Lead Connection Status: 753985
Implantable Lead Implant Date: 20210506
Implantable Lead Implant Date: 20210506
Implantable Lead Location: 753859
Implantable Lead Location: 753860
Implantable Pulse Generator Implant Date: 20210506
Lead Channel Impedance Value: 440 Ohm
Lead Channel Impedance Value: 480 Ohm
Lead Channel Pacing Threshold Amplitude: 0.5 V
Lead Channel Pacing Threshold Amplitude: 1.125 V
Lead Channel Pacing Threshold Pulse Width: 0.5 ms
Lead Channel Pacing Threshold Pulse Width: 0.5 ms
Lead Channel Sensing Intrinsic Amplitude: 10.8 mV
Lead Channel Sensing Intrinsic Amplitude: 2 mV
Lead Channel Setting Pacing Amplitude: 1.375
Lead Channel Setting Pacing Amplitude: 2 V
Lead Channel Setting Pacing Pulse Width: 0.5 ms
Lead Channel Setting Sensing Sensitivity: 2 mV
Pulse Gen Model: 2272
Pulse Gen Serial Number: 3825689

## 2024-01-06 NOTE — Progress Notes (Signed)
 Remote pacemaker transmission.

## 2024-01-30 DIAGNOSIS — Z0000 Encounter for general adult medical examination without abnormal findings: Secondary | ICD-10-CM | POA: Diagnosis not present

## 2024-01-30 DIAGNOSIS — E1165 Type 2 diabetes mellitus with hyperglycemia: Secondary | ICD-10-CM | POA: Diagnosis not present

## 2024-01-30 DIAGNOSIS — E871 Hypo-osmolality and hyponatremia: Secondary | ICD-10-CM | POA: Diagnosis not present

## 2024-01-30 DIAGNOSIS — I10 Essential (primary) hypertension: Secondary | ICD-10-CM | POA: Diagnosis not present

## 2024-01-30 DIAGNOSIS — M85851 Other specified disorders of bone density and structure, right thigh: Secondary | ICD-10-CM | POA: Diagnosis not present

## 2024-01-30 DIAGNOSIS — R300 Dysuria: Secondary | ICD-10-CM | POA: Diagnosis not present

## 2024-01-30 DIAGNOSIS — E7800 Pure hypercholesterolemia, unspecified: Secondary | ICD-10-CM | POA: Diagnosis not present

## 2024-01-30 DIAGNOSIS — R600 Localized edema: Secondary | ICD-10-CM | POA: Diagnosis not present

## 2024-01-30 DIAGNOSIS — K219 Gastro-esophageal reflux disease without esophagitis: Secondary | ICD-10-CM | POA: Diagnosis not present

## 2024-01-30 DIAGNOSIS — E039 Hypothyroidism, unspecified: Secondary | ICD-10-CM | POA: Diagnosis not present

## 2024-01-30 DIAGNOSIS — Z950 Presence of cardiac pacemaker: Secondary | ICD-10-CM | POA: Diagnosis not present

## 2024-01-30 DIAGNOSIS — R748 Abnormal levels of other serum enzymes: Secondary | ICD-10-CM | POA: Diagnosis not present

## 2024-02-02 ENCOUNTER — Other Ambulatory Visit: Payer: Self-pay | Admitting: Family Medicine

## 2024-02-02 DIAGNOSIS — M85851 Other specified disorders of bone density and structure, right thigh: Secondary | ICD-10-CM

## 2024-02-04 ENCOUNTER — Ambulatory Visit
Admission: RE | Admit: 2024-02-04 | Discharge: 2024-02-04 | Disposition: A | Discharge status: Home / Self Care | Source: Ambulatory Visit | Attending: Family Medicine | Admitting: Family Medicine

## 2024-02-04 DIAGNOSIS — M85851 Other specified disorders of bone density and structure, right thigh: Secondary | ICD-10-CM

## 2024-02-04 DIAGNOSIS — M810 Age-related osteoporosis without current pathological fracture: Secondary | ICD-10-CM | POA: Diagnosis not present

## 2024-02-22 ENCOUNTER — Inpatient Hospital Stay: Payer: Medicare Other | Attending: Obstetrics & Gynecology | Admitting: Obstetrics & Gynecology

## 2024-02-22 ENCOUNTER — Encounter: Payer: Self-pay | Admitting: Obstetrics & Gynecology

## 2024-02-22 VITALS — BP 135/63 | HR 61 | Temp 98.5°F | Resp 16 | Ht 64.0 in | Wt 173.4 lb

## 2024-02-22 DIAGNOSIS — C541 Malignant neoplasm of endometrium: Secondary | ICD-10-CM

## 2024-02-22 DIAGNOSIS — Z8542 Personal history of malignant neoplasm of other parts of uterus: Secondary | ICD-10-CM | POA: Insufficient documentation

## 2024-02-22 NOTE — Assessment & Plan Note (Signed)
 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  No evidence of recurrence     >continue follow-up every 6 mos for 5 yrs in accordance w/NCCN guidelines

## 2024-02-22 NOTE — Patient Instructions (Signed)
 Return in 6 months

## 2024-02-22 NOTE — Progress Notes (Signed)
 Follow Up Note: Gyn-Onc  [name] 78 y.o. female  CC: She presents for a f/u visit   HPI: The oncology history was reviewed.  Interval History: She denies any vaginal bleeding, abdominal/pelvic pain, cough, lethargy or increasing abdominal girth. Neg wet prep 10/24. She has no specific concerns. Discussed the use of AI scribe software for clinical note transcription with the patient, who gave verbal consent to proceed.    Review of Systems  Review of Systems  Constitutional:  Negative for malaise/fatigue and weight loss.  Respiratory:  Negative for shortness of breath and wheezing.   Cardiovascular:  Negative for chest pain and leg swelling.  Gastrointestinal:  Negative for abdominal pain, blood in stool, constipation, nausea and vomiting.  Genitourinary:  See above  Musculoskeletal:  Negative for joint pain and myalgias.  Neurological:  Negative for weakness.  Psychiatric/Behavioral:  Negative for depression. The patient does not have insomnia.    Current medications, allergy, social history, past surgical history, past medical history, family history were all reviewed.    Vitals: BP 135/63 (BP Location: Left Arm, Patient Position: Sitting)   Pulse 61   Temp 98.5 F (36.9 C) (Oral)   Resp 16   Ht 5\' 4"  (1.626 m)   Wt 173 lb 6.4 oz (78.7 kg)   SpO2 100%   BMI 29.76 kg/m     Physical Exam Exam conducted with a chaperone present.  Constitutional:      General: She is not in acute distress. Cardiovascular:     Rate and Rhythm: Normal rate and regular rhythm.  Pulmonary:     Effort: Pulmonary effort is normal.     Breath sounds: Normal breath sounds. No wheezing or rhonchi.  Abdominal:     Palpations: Abdomen is soft.     Tenderness: There is no abdominal tenderness. There is no right CVA tenderness or left CVA tenderness.     Hernia: No hernia is present.  Genitourinary:    General: Normal vulva. Mild erythema. Scant white discharge    Urethra: No urethral  lesion.     Vagina: No lesions. No bleeding.  Synechiae at the apex Musculoskeletal:     Cervical back: Neck supple.     Right lower leg: No edema.     Left lower leg: No edema.  Lymphadenopathy:     Upper Body:     Right upper body: No supraclavicular adenopathy.     Left upper body: No supraclavicular adenopathy.     Lower Body: No right inguinal adenopathy. No left inguinal adenopathy.  Skin:    Findings: No rash.  Neurological:     Mental Status: She is oriented to person, place, and time.   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  No evidence of recurrence     >continue follow-up every 6 mos for 5 yrs in accordance w/NCCN guidelines       I personally spent 25 minutes face-to-face and non-face-to-face in the care of this patient, which includes all pre, intra, and post visit time on the date of service.    Abdul Hodgkin, MD

## 2024-03-01 ENCOUNTER — Ambulatory Visit (INDEPENDENT_AMBULATORY_CARE_PROVIDER_SITE_OTHER): Payer: TRICARE For Life (TFL)

## 2024-03-01 DIAGNOSIS — I495 Sick sinus syndrome: Secondary | ICD-10-CM | POA: Diagnosis not present

## 2024-03-01 LAB — CUP PACEART REMOTE DEVICE CHECK
Battery Remaining Longevity: 74 mo
Battery Remaining Percentage: 64 %
Battery Voltage: 3.01 V
Brady Statistic AP VP Percent: 1 %
Brady Statistic AP VS Percent: 97 %
Brady Statistic AS VP Percent: 1 %
Brady Statistic AS VS Percent: 2.1 %
Brady Statistic RA Percent Paced: 96 %
Brady Statistic RV Percent Paced: 1 %
Date Time Interrogation Session: 20250501065236
Implantable Lead Connection Status: 753985
Implantable Lead Connection Status: 753985
Implantable Lead Implant Date: 20210506
Implantable Lead Implant Date: 20210506
Implantable Lead Location: 753859
Implantable Lead Location: 753860
Implantable Pulse Generator Implant Date: 20210506
Lead Channel Impedance Value: 450 Ohm
Lead Channel Impedance Value: 480 Ohm
Lead Channel Pacing Threshold Amplitude: 0.5 V
Lead Channel Pacing Threshold Amplitude: 1.25 V
Lead Channel Pacing Threshold Pulse Width: 0.5 ms
Lead Channel Pacing Threshold Pulse Width: 0.5 ms
Lead Channel Sensing Intrinsic Amplitude: 10.3 mV
Lead Channel Sensing Intrinsic Amplitude: 2.6 mV
Lead Channel Setting Pacing Amplitude: 1.5 V
Lead Channel Setting Pacing Amplitude: 2 V
Lead Channel Setting Pacing Pulse Width: 0.5 ms
Lead Channel Setting Sensing Sensitivity: 2 mV
Pulse Gen Model: 2272
Pulse Gen Serial Number: 3825689

## 2024-03-15 DIAGNOSIS — N3000 Acute cystitis without hematuria: Secondary | ICD-10-CM | POA: Diagnosis not present

## 2024-03-29 DIAGNOSIS — N3000 Acute cystitis without hematuria: Secondary | ICD-10-CM | POA: Diagnosis not present

## 2024-04-10 DIAGNOSIS — N6320 Unspecified lump in the left breast, unspecified quadrant: Secondary | ICD-10-CM | POA: Diagnosis not present

## 2024-04-11 ENCOUNTER — Other Ambulatory Visit: Payer: Self-pay | Admitting: Nurse Practitioner

## 2024-04-11 DIAGNOSIS — N6320 Unspecified lump in the left breast, unspecified quadrant: Secondary | ICD-10-CM

## 2024-04-12 NOTE — Progress Notes (Signed)
 Remote pacemaker transmission.

## 2024-04-18 ENCOUNTER — Ambulatory Visit
Admission: RE | Admit: 2024-04-18 | Discharge: 2024-04-18 | Disposition: A | Discharge status: Home / Self Care | Source: Ambulatory Visit | Attending: Nurse Practitioner | Admitting: Nurse Practitioner

## 2024-04-18 DIAGNOSIS — N6320 Unspecified lump in the left breast, unspecified quadrant: Secondary | ICD-10-CM

## 2024-04-18 DIAGNOSIS — N644 Mastodynia: Secondary | ICD-10-CM | POA: Diagnosis not present

## 2024-05-31 ENCOUNTER — Ambulatory Visit (INDEPENDENT_AMBULATORY_CARE_PROVIDER_SITE_OTHER): Payer: TRICARE For Life (TFL)

## 2024-05-31 DIAGNOSIS — I495 Sick sinus syndrome: Secondary | ICD-10-CM | POA: Diagnosis not present

## 2024-05-31 LAB — CUP PACEART REMOTE DEVICE CHECK
Battery Remaining Longevity: 71 mo
Battery Remaining Percentage: 62 %
Battery Voltage: 3.01 V
Brady Statistic AP VP Percent: 1 %
Brady Statistic AP VS Percent: 98 %
Brady Statistic AS VP Percent: 1 %
Brady Statistic AS VS Percent: 1.8 %
Brady Statistic RA Percent Paced: 96 %
Brady Statistic RV Percent Paced: 1 %
Date Time Interrogation Session: 20250731040020
Implantable Lead Connection Status: 753985
Implantable Lead Connection Status: 753985
Implantable Lead Implant Date: 20210506
Implantable Lead Implant Date: 20210506
Implantable Lead Location: 753859
Implantable Lead Location: 753860
Implantable Pulse Generator Implant Date: 20210506
Lead Channel Impedance Value: 440 Ohm
Lead Channel Impedance Value: 440 Ohm
Lead Channel Pacing Threshold Amplitude: 0.5 V
Lead Channel Pacing Threshold Amplitude: 1 V
Lead Channel Pacing Threshold Pulse Width: 0.5 ms
Lead Channel Pacing Threshold Pulse Width: 0.5 ms
Lead Channel Sensing Intrinsic Amplitude: 1.8 mV
Lead Channel Sensing Intrinsic Amplitude: 10.4 mV
Lead Channel Setting Pacing Amplitude: 1.25 V
Lead Channel Setting Pacing Amplitude: 2 V
Lead Channel Setting Pacing Pulse Width: 0.5 ms
Lead Channel Setting Sensing Sensitivity: 2 mV
Pulse Gen Model: 2272
Pulse Gen Serial Number: 3825689

## 2024-06-01 ENCOUNTER — Ambulatory Visit: Payer: Self-pay | Admitting: Cardiology

## 2024-06-26 DIAGNOSIS — R350 Frequency of micturition: Secondary | ICD-10-CM | POA: Diagnosis not present

## 2024-06-26 DIAGNOSIS — R300 Dysuria: Secondary | ICD-10-CM | POA: Diagnosis not present

## 2024-07-13 ENCOUNTER — Telehealth: Payer: Self-pay

## 2024-07-13 DIAGNOSIS — Z950 Presence of cardiac pacemaker: Secondary | ICD-10-CM | POA: Diagnosis not present

## 2024-07-13 DIAGNOSIS — R1310 Dysphagia, unspecified: Secondary | ICD-10-CM | POA: Diagnosis not present

## 2024-07-13 DIAGNOSIS — R001 Bradycardia, unspecified: Secondary | ICD-10-CM | POA: Diagnosis not present

## 2024-07-13 DIAGNOSIS — K317 Polyp of stomach and duodenum: Secondary | ICD-10-CM | POA: Diagnosis not present

## 2024-07-13 NOTE — Telephone Encounter (Signed)
   Name: [name]  DOB: 15-Jun-1946  MRN: [id number]  Primary Cardiologist: Oneil Parchment, MD   Preoperative team, please contact this patient and set up a phone call appointment ASAP for further preoperative risk assessment. Please obtain consent and complete medication review. Thank you for your help. Procedure is scheduled for 07/30/2024.   I confirm that guidance regarding antiplatelet and oral anticoagulation therapy has been completed and, if necessary, noted below.  Per office protocol, if patient is without any new symptoms or concerns at the time of their virtual visit, she may hold aspirin  for 7 days prior to procedure. Please resume aspirin  as soon as possible postprocedure, at the discretion of the surgeon.    I also confirmed the patient resides in the state of [place] . As per [facility] Medical Board telemedicine laws, the patient must reside in the state in which the provider is licensed.   Lamarr Satterfield, NP 07/13/2024, 2:13 PM [facility]

## 2024-07-13 NOTE — Telephone Encounter (Signed)
 Med Rec and Consent done.    Patient Consent for Virtual Visit        [name] has provided verbal consent on 07/13/2024 for a virtual visit (video or telephone).   CONSENT FOR VIRTUAL VISIT FOR:  [name]  By participating in this virtual visit I agree to the following:  I hereby voluntarily request, consent and authorize [facility] and its employed or contracted physicians, physician assistants, nurse practitioners or other licensed health care professionals (the Practitioner), to provide me with telemedicine health care services (the "Services) as deemed necessary by the treating Practitioner. I acknowledge and consent to receive the Services by the Practitioner via telemedicine. I understand that the telemedicine visit will involve communicating with the Practitioner through live audiovisual communication technology and the disclosure of certain medical information by electronic transmission. I acknowledge that I have been given the opportunity to request an in-person assessment or other available alternative prior to the telemedicine visit and am voluntarily participating in the telemedicine visit.  I understand that I have the right to withhold or withdraw my consent to the use of telemedicine in the course of my care at any time, without affecting my right to future care or treatment, and that the Practitioner or I may terminate the telemedicine visit at any time. I understand that I have the right to inspect all information obtained and/or recorded in the course of the telemedicine visit and may receive copies of available information for a reasonable fee.  I understand that some of the potential risks of receiving the Services via telemedicine include:  Delay or interruption in medical evaluation due to technological equipment failure or disruption; Information transmitted may not be sufficient (e.g. poor resolution of images) to allow for appropriate medical  decision making by the Practitioner; and/or  In rare instances, security protocols could fail, causing a breach of personal health information.  Furthermore, I acknowledge that it is my responsibility to provide information about my medical history, conditions and care that is complete and accurate to the best of my ability. I acknowledge that Practitioner's advice, recommendations, and/or decision may be based on factors not within their control, such as incomplete or inaccurate data provided by me or distortions of diagnostic images or specimens that may result from electronic transmissions. I understand that the practice of medicine is not an exact science and that Practitioner makes no warranties or guarantees regarding treatment outcomes. I acknowledge that a copy of this consent can be made available to me via my patient portal [facility] MyChart), or I can request a printed copy by calling the office of [facility].    I understand that my insurance will be billed for this visit.   I have read or had this consent read to me. I understand the contents of this consent, which adequately explains the benefits and risks of the Services being provided via telemedicine.  I have been provided ample opportunity to ask questions regarding this consent and the Services and have had my questions answered to my satisfaction. I give my informed consent for the services to be provided through the use of telemedicine in my medical care

## 2024-07-13 NOTE — Telephone Encounter (Signed)
   Pre-operative Risk Assessment    Patient Name: [name]  DOB: 21-Nov-1945 MRN: [id number]   Date of last office visit: 10/06/23 ONEIL PARCHMENT, MD Date of next office visit: 08/09/24 WILL CAMNITZ, MD   Request for Surgical Clearance    Procedure:  ENDOSCOPY  Date of Surgery:  Clearance 07/30/24                                Surgeon:  DR OLIVA BOOTS Surgeon's Group or Practice Name:  [facility] Phone number:  [phone] Fax number:  [phone]   Type of Clearance Requested:   - Medical  - Pharmacy:  Hold Aspirin      Type of Anesthesia:  PROPOFOL    Additional requests/questions:    SignedLucie DELENA Ku   07/13/2024, 2:07 PM

## 2024-07-13 NOTE — Telephone Encounter (Signed)
 S/W pt and scheduled TELE Preop appt 07/19/24. Med Rec and Consent done.  Will update surgeons office

## 2024-07-19 ENCOUNTER — Ambulatory Visit: Attending: Cardiology | Admitting: Nurse Practitioner

## 2024-07-19 DIAGNOSIS — Z01810 Encounter for preprocedural cardiovascular examination: Secondary | ICD-10-CM | POA: Insufficient documentation

## 2024-07-19 NOTE — Progress Notes (Signed)
 Virtual Visit via Telephone Note   Because of [name] co-morbid illnesses, she is at least at moderate risk for complications without adequate follow up.  This format is felt to be most appropriate for this patient at this time.  Due to technical limitations with video connection (technology), today's appointment will be conducted as an audio only telehealth visit, and [name] verbally agreed to proceed in this manner.   All issues noted in this document were discussed and addressed.  No physical exam could be performed with this format.  Evaluation Performed:  Preoperative cardiovascular risk assessment _____________   Date:  07/19/2024   Patient ID:  [name] Primes, DOB 02-10-1946, MRN [id number] Patient Location:  Home Provider location:   Office  Primary Care Provider:  Arloa Elsie SAUNDERS, MD Primary Cardiologist:  Oneil Parchment, MD  Chief Complaint / Patient Profile   78 y.o. y/o female with a h/o symptomatic bradycardia s/p PPM, hypertension, hyperlipidemia, type 2 diabetes, CKD, chronic SIADH with hyponatremia, and hypothyroidism, who is pending endoscopy on 07/30/2024 with Dr. Oneil Boots of [facility] and presents today for telephonic preoperative cardiovascular risk assessment.  History of Present Illness    [name] is a 78 y.o. female who presents via audio/video conferencing for a telehealth visit today.  Pt was last seen in cardiology clinic on 10/06/2023 by Dr. Parchment.  At that time [name] was doing well.  The patient is now pending procedure as outlined above. Since her last visit, she   She denies chest pain, palpitations, dyspnea, pnd, orthopnea, n, v, dizziness, syncope, edema, weight gain, or early satiety. All other systems reviewed and are otherwise negative except as noted above.   Past Medical History    Past Medical History:  Diagnosis Date   Acute meniscal tear of knee    Anxiety    Basal cell carcinoma    endometrial  cancer    Colon polyp    Diabetes (HCC)    type 2   Diverticulosis    Edema    Esophageal reflux    Fibrocystic breast    Hemorrhoids    HTN (hypertension)    Hyperlipidemia    Hyponatremia    Hypothyroidism    OA (osteoarthritis) of hip    Presence of permanent cardiac pacemaker    due to bradycardia  03-2020   Seasonal allergies    Sinus bradycardia    Syncope    vagal,  in the setting of GI cramping   Past Surgical History:  Procedure Laterality Date   CHOLECYSTECTOMY     KNEE ARTHROSCOPY     meniscal tear   right   PACEMAKER IMPLANT N/A 03/06/2020   Procedure: PACEMAKER IMPLANT;  Surgeon: Kelsie Agent, MD;  Location: [facility];  Service: Cardiovascular;  Laterality: N/A;   ROBOTIC ASSISTED TOTAL HYSTERECTOMY WITH BILATERAL SALPINGO OOPHERECTOMY N/A 10/07/2020   Procedure: XI ROBOTIC ASSISTED TOTAL HYSTERECTOMY WITH BILATERAL SALPINGO OOPHORECTOMY;  Surgeon: Eloy Herring, MD;  Location: [facility];  Service: Gynecology;  Laterality: N/A;   SENTINEL NODE BIOPSY N/A 10/07/2020   Procedure: SENTINEL NODE BIOPSY;  Surgeon: Eloy Herring, MD;  Location: [facility];  Service: Gynecology;  Laterality: N/A;   TONSILLECTOMY     TUBAL LIGATION      Allergies  Allergies  Allergen Reactions   Amoxicillin Rash    Other reaction(s): rash   Sulfamethoxazole-Trimethoprim Other (See Comments)    Decreased sodium significantly  Other reaction(s): low  sodium   Ciprofloxacin Other (See Comments)    Muscle aches/pains   Atorvastatin Other (See Comments)    CPK Other reaction(s): increase CPK   Clarithromycin Nausea And Vomiting    Other reaction(s): vomiting   Ezetimibe Other (See Comments)    CPK  Other reaction(s): increase CPK   Fenofibrate Other (See Comments)    CPK Other reaction(s): increase CPK   Hydrochlorothiazide Other (See Comments)    Decreased Potassium and decreased Sodium Other reaction(s): hyponatremia, hypokalemia   Niaspan [Niacin Er (Antihyperlipidemic)] Other (See  Comments)    CPK   Welchol [Colesevelam Hcl] Other (See Comments)    CPK   Zoster Vaccine Live     Doesn't recall the reaction Other reaction(s): unknown   Clindamycin/Lincomycin Rash   Keflex [Cephalexin] Rash   Metronidazole Rash    Home Medications    Prior to Admission medications   Medication Sig Start Date End Date Taking? Authorizing Provider  amLODipine  (NORVASC ) 5 MG tablet  01/12/23   [provider]  aspirin  81 MG tablet Take 81 mg by mouth daily.     [provider]  Bioflavonoid Products (VITAMIN C) CHEW Chew 3 tablets by mouth daily.     [provider]  Calcium  Carbonate (CALCIUM  500 PO) Take by mouth.    [provider]  Cholecalciferol (VITAMIN D) 125 MCG (5000 UT) CAPS Take 5,000 Units by mouth daily.     [provider]  cloNIDine (CATAPRES) 0.1 MG tablet 2 (two) times daily. 01/12/23   [provider]  Cranberry 500 MG CHEW Chew 1,500 mg by mouth daily.    [provider]  esomeprazole (NEXIUM) 40 MG capsule Take 40 mg by mouth daily. 11/08/18   [provider]  famotidine (PEPCID) 20 MG tablet Take 20 mg by mouth daily.    [provider]  fluticasone  (FLONASE ) 50 MCG/ACT nasal spray Place 2 sprays into both nostrils daily as needed for allergies or rhinitis.     [provider]  furosemide  (LASIX ) 20 MG tablet Take 1-2 tablets by mouth daily as needed for fluid or edema.    [provider]  levothyroxine  (SYNTHROID , LEVOTHROID) 50 MCG tablet Take 50 mcg by mouth daily before breakfast.    [provider]  metFORMIN (GLUCOPHAGE-XR) 500 MG 24 hr tablet Take 500 mg by mouth 2 (two) times daily. 06/23/22   [provider]  olmesartan (BENICAR) 40 MG tablet Take 40 mg by mouth daily. 09/18/21   [provider]  Polyethyl Glyc-Propyl Glyc PF (SYSTANE HYDRATION PF) 0.4-0.3 % SOLN Place 1 drop into both eyes 2 (two) times daily as needed (dry eyes).     [provider]  Probiotic Product (PROBIOTIC DAILY PO) Take 1 capsule by mouth daily.    [provider]  psyllium (METAMUCIL) 58.6 % packet Take 1 packet by mouth at bedtime.    [provider]  rosuvastatin  (CRESTOR ) 5 MG tablet Take 5 mg by mouth daily.    [provider]    Physical Exam    Vital Signs:  NAIDELYN PARRELLA does not have vital signs available for review today.  Given telephonic nature of communication, physical exam is limited. AAOx3. NAD. Normal affect.  Speech and respirations are unlabored.  Accessory Clinical Findings    None  Assessment & Plan    1.  Preoperative Cardiovascular Risk Assessment:  According to the Revised Cardiac Risk Index (RCRI), her Perioperative Risk of Major Cardiac Event is (%):  0.4. Her Functional Capacity in METs is: 5.72 according to the Duke Activity Status Index (DASI). Therefore, based on ACC/AHA guidelines, patient would be at acceptable risk for the planned procedure without further cardiovascular testing.   The patient was advised that if she develops new symptoms prior to surgery to contact our office to arrange for a follow-up visit, and she verbalized understanding.  Per office protocol, she may hold Aspirin  for 5-7 days prior to procedure. Please resume Aspirin  as soon as possible postprocedure, at the discretion of the surgeon.    A copy of this note will be routed to requesting surgeon.  Time:   Today, I have spent 5 minutes with the patient with telehealth technology discussing medical history, symptoms, and management plan.     Damien JAYSON Braver, NP  07/19/2024, 10:32 AM

## 2024-07-23 DIAGNOSIS — R3915 Urgency of urination: Secondary | ICD-10-CM | POA: Diagnosis not present

## 2024-07-23 DIAGNOSIS — N3020 Other chronic cystitis without hematuria: Secondary | ICD-10-CM | POA: Diagnosis not present

## 2024-07-23 DIAGNOSIS — R197 Diarrhea, unspecified: Secondary | ICD-10-CM | POA: Diagnosis not present

## 2024-07-23 DIAGNOSIS — R350 Frequency of micturition: Secondary | ICD-10-CM | POA: Diagnosis not present

## 2024-07-27 DIAGNOSIS — E1165 Type 2 diabetes mellitus with hyperglycemia: Secondary | ICD-10-CM | POA: Diagnosis not present

## 2024-07-27 DIAGNOSIS — E039 Hypothyroidism, unspecified: Secondary | ICD-10-CM | POA: Diagnosis not present

## 2024-07-27 DIAGNOSIS — E7800 Pure hypercholesterolemia, unspecified: Secondary | ICD-10-CM | POA: Diagnosis not present

## 2024-07-27 DIAGNOSIS — I10 Essential (primary) hypertension: Secondary | ICD-10-CM | POA: Diagnosis not present

## 2024-07-27 DIAGNOSIS — R300 Dysuria: Secondary | ICD-10-CM | POA: Diagnosis not present

## 2024-07-27 DIAGNOSIS — R600 Localized edema: Secondary | ICD-10-CM | POA: Diagnosis not present

## 2024-07-27 DIAGNOSIS — K219 Gastro-esophageal reflux disease without esophagitis: Secondary | ICD-10-CM | POA: Diagnosis not present

## 2024-07-27 DIAGNOSIS — Z23 Encounter for immunization: Secondary | ICD-10-CM | POA: Diagnosis not present

## 2024-07-27 DIAGNOSIS — N1831 Chronic kidney disease, stage 3a: Secondary | ICD-10-CM | POA: Diagnosis not present

## 2024-07-30 DIAGNOSIS — K317 Polyp of stomach and duodenum: Secondary | ICD-10-CM | POA: Diagnosis not present

## 2024-07-30 DIAGNOSIS — K449 Diaphragmatic hernia without obstruction or gangrene: Secondary | ICD-10-CM | POA: Diagnosis not present

## 2024-07-30 DIAGNOSIS — R1310 Dysphagia, unspecified: Secondary | ICD-10-CM | POA: Diagnosis not present

## 2024-07-30 HISTORY — PX: UPPER GI ENDOSCOPY: SHX6162

## 2024-08-01 NOTE — Progress Notes (Signed)
 Remote PPM Transmission

## 2024-08-02 DIAGNOSIS — E871 Hypo-osmolality and hyponatremia: Secondary | ICD-10-CM | POA: Diagnosis not present

## 2024-08-03 DIAGNOSIS — H02831 Dermatochalasis of right upper eyelid: Secondary | ICD-10-CM | POA: Diagnosis not present

## 2024-08-03 DIAGNOSIS — H02834 Dermatochalasis of left upper eyelid: Secondary | ICD-10-CM | POA: Diagnosis not present

## 2024-08-03 DIAGNOSIS — H2513 Age-related nuclear cataract, bilateral: Secondary | ICD-10-CM | POA: Diagnosis not present

## 2024-08-03 DIAGNOSIS — H5203 Hypermetropia, bilateral: Secondary | ICD-10-CM | POA: Diagnosis not present

## 2024-08-03 DIAGNOSIS — E119 Type 2 diabetes mellitus without complications: Secondary | ICD-10-CM | POA: Diagnosis not present

## 2024-08-07 DIAGNOSIS — K317 Polyp of stomach and duodenum: Secondary | ICD-10-CM | POA: Diagnosis not present

## 2024-08-09 ENCOUNTER — Encounter: Payer: Self-pay | Admitting: Cardiology

## 2024-08-09 ENCOUNTER — Ambulatory Visit: Attending: Cardiology | Admitting: Cardiology

## 2024-08-09 VITALS — BP 136/81 | HR 60 | Ht 64.0 in | Wt 176.0 lb

## 2024-08-09 DIAGNOSIS — R001 Bradycardia, unspecified: Secondary | ICD-10-CM | POA: Diagnosis not present

## 2024-08-09 DIAGNOSIS — I495 Sick sinus syndrome: Secondary | ICD-10-CM | POA: Diagnosis not present

## 2024-08-09 DIAGNOSIS — Z950 Presence of cardiac pacemaker: Secondary | ICD-10-CM | POA: Diagnosis not present

## 2024-08-09 LAB — CUP PACEART INCLINIC DEVICE CHECK
Battery Remaining Longevity: 72 mo
Battery Voltage: 3.01 V
Brady Statistic RA Percent Paced: 96 %
Brady Statistic RV Percent Paced: 0.22 %
Date Time Interrogation Session: 20251009133226
Implantable Lead Connection Status: 753985
Implantable Lead Connection Status: 753985
Implantable Lead Implant Date: 20210506
Implantable Lead Implant Date: 20210506
Implantable Lead Location: 753859
Implantable Lead Location: 753860
Implantable Pulse Generator Implant Date: 20210506
Lead Channel Impedance Value: 425 Ohm
Lead Channel Impedance Value: 487.5 Ohm
Lead Channel Pacing Threshold Amplitude: 0.5 V
Lead Channel Pacing Threshold Amplitude: 0.5 V
Lead Channel Pacing Threshold Amplitude: 1 V
Lead Channel Pacing Threshold Pulse Width: 0.5 ms
Lead Channel Pacing Threshold Pulse Width: 0.5 ms
Lead Channel Pacing Threshold Pulse Width: 0.5 ms
Lead Channel Sensing Intrinsic Amplitude: 9.4 mV
Lead Channel Setting Pacing Amplitude: 1.25 V
Lead Channel Setting Pacing Amplitude: 2 V
Lead Channel Setting Pacing Pulse Width: 0.5 ms
Lead Channel Setting Sensing Sensitivity: 2 mV
Pulse Gen Model: 2272
Pulse Gen Serial Number: 3825689

## 2024-08-09 NOTE — Progress Notes (Signed)
  Electrophysiology Office Note:   Date:  08/09/2024  ID:  [name], DOB 1946-05-14, MRN [id number]  Primary Cardiologist: Oneil Parchment, MD Primary Heart Failure: None Electrophysiologist: Landon Bassford Gladis Norton, MD      History of Present Illness:   [name] is a 78 y.o. female with h/o symptomatic bradycardia, hypertension, hyperlipidemia seen today for routine electrophysiology followup.   Since last being seen in our clinic the patient reports doing overall well from a cardiac perspective.  She recently had an endoscopy.  Since her endoscopy, she has complained of some back pain when she walks.  This happens intermittently.  She has no other complaints and is happy with her cardiac issues.  she denies chest pain, palpitations, dyspnea, PND, orthopnea, nausea, vomiting, dizziness, syncope, edema, weight gain, or early satiety.   Review of systems complete and found to be negative unless listed in HPI.      EP Information / Studies Reviewed:    EKG is ordered today. Personal review as below.  EKG Interpretation Date/Time:  Thursday August 09 2024 12:18:02 EDT Ventricular Rate:  60 PR Interval:  180 QRS Duration:  96 QT Interval:  458 QTC Calculation: 458 R Axis:   -11  Text Interpretation: Atrial-paced rhythm Nonspecific T wave abnormality When compared with ECG of 05-Aug-2023 08:07, QRS axis Shifted right T wave inversion no longer evident in Lateral leads Confirmed by Kassidi Elza ([id number]) on 08/09/2024 12:26:25 PM   PPM Interrogation-  reviewed in detail today,  See PACEART report.  Device History: Abbott Dual Chamber PPM implanted 2021 for Sinus Node Dysfunction  Risk Assessment/Calculations:           Physical Exam:   VS:  BP 136/81 (BP Location: Left Arm, Patient Position: Sitting, Cuff Size: Large)   Pulse 60   Ht 5' 4 (1.626 m)   Wt 176 lb (79.8 kg)   SpO2 99%   BMI 30.21 kg/m    Wt Readings from Last 3 Encounters:  08/09/24 176 lb (79.8 kg)   02/22/24 173 lb 6.4 oz (78.7 kg)  10/06/23 176 lb 3.2 oz (79.9 kg)     GEN: Well nourished, well developed in no acute distress NECK: No JVD; No carotid bruits CARDIAC: Regular rate and rhythm, no murmurs, rubs, gallops RESPIRATORY:  Clear to auscultation without rales, wheezing or rhonchi  ABDOMEN: Soft, non-tender, non-distended EXTREMITIES:  No edema; No deformity   ASSESSMENT AND PLAN:    SND s/p Abbott PPM  Normal PPM function See Pace Art report No changes today  2.  Hypertension: well controlled  3.  Hyperlipidemia: Continue statin  Disposition:   Follow up with EP Team 2 years   Signed, Keghan Mcfarren Gladis Norton, MD

## 2024-08-12 ENCOUNTER — Ambulatory Visit: Payer: Self-pay | Admitting: Cardiology

## 2024-08-22 ENCOUNTER — Encounter: Payer: Self-pay | Admitting: Obstetrics & Gynecology

## 2024-08-29 ENCOUNTER — Encounter: Payer: Self-pay | Admitting: Obstetrics & Gynecology

## 2024-08-29 ENCOUNTER — Inpatient Hospital Stay: Attending: Obstetrics & Gynecology | Admitting: Obstetrics & Gynecology

## 2024-08-29 VITALS — BP 142/66 | HR 60 | Temp 97.8°F | Resp 19 | Wt 176.2 lb

## 2024-08-29 DIAGNOSIS — Z8542 Personal history of malignant neoplasm of other parts of uterus: Secondary | ICD-10-CM | POA: Diagnosis not present

## 2024-08-29 DIAGNOSIS — C541 Malignant neoplasm of endometrium: Secondary | ICD-10-CM

## 2024-08-29 NOTE — Progress Notes (Signed)
 Follow Up Note: Gyn-Onc  [name] 78 y.o. female  CC: She presents for a f/u visit   HPI: The oncology history was reviewed.  Interval History: She denies any vaginal bleeding, abdominal/pelvic pain, cough, lethargy or increasing abdominal girth.   Discussed the use of AI scribe software for clinical note transcription with the patient, who gave verbal consent to proceed. She experiences recurrent urinary tract infections (UTIs) and has tried various treatments. Previously, she used a vaginal estrogen cream, which caused burning and discomfort and was not effective in preventing infections. Currently, she takes cranberry supplements and vitamin C to help manage the UTIs. She mentioned a recommendation for another supplement starting with 'M', but has not yet started it. The patient reports that her recent infections have involved E. coli and another organism that starts with a K and ends with 'pneumoniae'.  She experiences pain and burning associated with UTIs, which sometimes occurs continuously and not just during urination. She is unsure of the exact location of the pain      Review of Systems  Review of Systems  Constitutional:  Negative for malaise/fatigue and weight loss.  Respiratory:  Negative for shortness of breath and wheezing.   Cardiovascular:  Negative for chest pain and leg swelling.  Gastrointestinal:  Negative for abdominal pain, blood in stool, constipation, nausea and vomiting.  Genitourinary:  See above  Musculoskeletal:  Negative for joint pain and myalgias.  Neurological:  Negative for weakness.  Psychiatric/Behavioral:  Negative for depression. The patient does not have insomnia.    Current medications, allergy, social history, past surgical history, past medical history, family history were all reviewed.    Vitals: BP (!) 142/66 Comment: manual recheck  Pulse 60   Temp 97.8 F (36.6 C) (Oral)   Resp 19   Wt 176 lb 3.2 oz (79.9 kg)   SpO2 100%    BMI 30.24 kg/m     Physical Exam Exam conducted with a chaperone present.  Constitutional:      General: She is not in acute distress. Cardiovascular:     Rate and Rhythm: Normal rate and regular rhythm.  Pulmonary:     Effort: Pulmonary effort is normal.     Breath sounds: Normal breath sounds. No wheezing or rhonchi.  Abdominal:     Palpations: Abdomen is soft.     Tenderness: There is no abdominal tenderness. There is no right CVA tenderness or left CVA tenderness.     Hernia: No hernia is present.  Genitourinary:    General: Normal vulva. Mild erythema. Scant white discharge    Urethra: No urethral lesion.     Vagina: No lesions. No bleeding.  Synechiae at the apex Musculoskeletal:     Cervical back: Neck supple.     Right lower leg: Mild edema.     Left lower leg: Mild edema.  Lymphadenopathy:     Upper Body:     Right upper body: No supraclavicular adenopathy.     Left upper body: No supraclavicular adenopathy.     Lower Body: No right inguinal adenopathy. No left inguinal adenopathy.  Skin:    Findings: No rash.  Neurological:     Mental Status: She is oriented to person, place, and time.    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 Otherwise negative symptom review, normal exam.  No evidence of  recurrence   - Consider Estring  for estrogen delivery; preventive supplements I.e.,methenamine, D-mannose - Continue follow-up every 6 mos for 5 yrs in accordance w/NCCN guidelines       I personally spent 25 minutes face-to-face and non-face-to-face in the care of this patient, which includes all pre, intra, and post visit time on the date of service.    Olam Mill, MD

## 2024-08-29 NOTE — Assessment & Plan Note (Addendum)
 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 Otherwise negative symptom review, normal exam.  No evidence of recurrence   - Consider Estring  for estrogen delivery; preventive supplements I.e.,methenamine, D-mannose - Continue follow-up every 6 mos for 5 yrs in accordance w/NCCN guidelines

## 2024-08-29 NOTE — Patient Instructions (Signed)
 Return in 6 months

## 2024-08-30 ENCOUNTER — Ambulatory Visit: Payer: TRICARE For Life (TFL)

## 2024-08-30 DIAGNOSIS — I495 Sick sinus syndrome: Secondary | ICD-10-CM | POA: Diagnosis not present

## 2024-08-30 LAB — CUP PACEART REMOTE DEVICE CHECK
Battery Remaining Longevity: 69 mo
Battery Remaining Percentage: 59 %
Battery Voltage: 3.01 V
Brady Statistic AP VP Percent: 1 %
Brady Statistic AP VS Percent: 98 %
Brady Statistic AS VP Percent: 1 %
Brady Statistic AS VS Percent: 1.3 %
Brady Statistic RA Percent Paced: 97 %
Brady Statistic RV Percent Paced: 1 %
Date Time Interrogation Session: 20251030040014
Implantable Lead Connection Status: 753985
Implantable Lead Connection Status: 753985
Implantable Lead Implant Date: 20210506
Implantable Lead Implant Date: 20210506
Implantable Lead Location: 753859
Implantable Lead Location: 753860
Implantable Pulse Generator Implant Date: 20210506
Lead Channel Impedance Value: 430 Ohm
Lead Channel Impedance Value: 450 Ohm
Lead Channel Pacing Threshold Amplitude: 0.5 V
Lead Channel Pacing Threshold Amplitude: 1 V
Lead Channel Pacing Threshold Pulse Width: 0.5 ms
Lead Channel Pacing Threshold Pulse Width: 0.5 ms
Lead Channel Sensing Intrinsic Amplitude: 0.5 mV
Lead Channel Sensing Intrinsic Amplitude: 10 mV
Lead Channel Setting Pacing Amplitude: 1.25 V
Lead Channel Setting Pacing Amplitude: 2 V
Lead Channel Setting Pacing Pulse Width: 0.5 ms
Lead Channel Setting Sensing Sensitivity: 2 mV
Pulse Gen Model: 2272
Pulse Gen Serial Number: 3825689

## 2024-08-31 ENCOUNTER — Ambulatory Visit: Payer: Self-pay | Admitting: Cardiology

## 2024-09-03 DIAGNOSIS — H40053 Ocular hypertension, bilateral: Secondary | ICD-10-CM | POA: Diagnosis not present

## 2024-09-03 DIAGNOSIS — H04123 Dry eye syndrome of bilateral lacrimal glands: Secondary | ICD-10-CM | POA: Diagnosis not present

## 2024-09-03 DIAGNOSIS — H2513 Age-related nuclear cataract, bilateral: Secondary | ICD-10-CM | POA: Diagnosis not present

## 2024-09-04 DIAGNOSIS — I10 Essential (primary) hypertension: Secondary | ICD-10-CM | POA: Diagnosis not present

## 2024-09-04 DIAGNOSIS — R399 Unspecified symptoms and signs involving the genitourinary system: Secondary | ICD-10-CM | POA: Diagnosis not present

## 2024-09-04 DIAGNOSIS — E871 Hypo-osmolality and hyponatremia: Secondary | ICD-10-CM | POA: Diagnosis not present

## 2024-09-04 NOTE — Progress Notes (Signed)
 Remote PPM Transmission

## 2024-09-06 DIAGNOSIS — S99922A Unspecified injury of left foot, initial encounter: Secondary | ICD-10-CM | POA: Diagnosis not present

## 2024-09-06 DIAGNOSIS — M7989 Other specified soft tissue disorders: Secondary | ICD-10-CM | POA: Diagnosis not present

## 2024-09-24 DIAGNOSIS — L03116 Cellulitis of left lower limb: Secondary | ICD-10-CM | POA: Diagnosis not present

## 2024-10-01 ENCOUNTER — Ambulatory Visit: Attending: Cardiology | Admitting: Cardiology

## 2024-10-01 ENCOUNTER — Encounter: Payer: Self-pay | Admitting: Cardiology

## 2024-10-01 VITALS — BP 148/83 | HR 78 | Ht 64.0 in | Wt 178.0 lb

## 2024-10-01 DIAGNOSIS — R072 Precordial pain: Secondary | ICD-10-CM | POA: Insufficient documentation

## 2024-10-01 DIAGNOSIS — Z950 Presence of cardiac pacemaker: Secondary | ICD-10-CM | POA: Diagnosis not present

## 2024-10-01 DIAGNOSIS — Z01812 Encounter for preprocedural laboratory examination: Secondary | ICD-10-CM | POA: Insufficient documentation

## 2024-10-01 DIAGNOSIS — I495 Sick sinus syndrome: Secondary | ICD-10-CM | POA: Diagnosis not present

## 2024-10-01 DIAGNOSIS — E871 Hypo-osmolality and hyponatremia: Secondary | ICD-10-CM | POA: Insufficient documentation

## 2024-10-01 LAB — BASIC METABOLIC PANEL WITH GFR
BUN/Creatinine Ratio: 24 (ref 12–28)
BUN: 23 mg/dL (ref 8–27)
CO2: 22 mmol/L (ref 20–29)
Calcium: 9.8 mg/dL (ref 8.7–10.3)
Chloride: 97 mmol/L (ref 96–106)
Creatinine, Ser: 0.97 mg/dL (ref 0.57–1.00)
Glucose: 134 mg/dL — ABNORMAL HIGH (ref 70–99)
Potassium: 4.9 mmol/L (ref 3.5–5.2)
Sodium: 133 mmol/L — ABNORMAL LOW (ref 134–144)
eGFR: 61 mL/min/1.73 (ref >59)

## 2024-10-01 MED ORDER — METOPROLOL TARTRATE 100 MG PO TABS: 100.0000 mg | ORAL_TABLET | ORAL | 0 refills | Status: AC

## 2024-10-01 NOTE — Patient Instructions (Addendum)
 Medication Instructions:  No medication changes were made at this visit. Continue current regimen.   *If you need a refill on your cardiac medications before your next appointment, please call your pharmacy*  Lab Work: Please have blood work today (BMP)  If you have labs (blood work) drawn today and your tests are completely normal, you will receive your results only by: MyChart Message (if you have MyChart) OR A paper copy in the mail If you have any lab test that is abnormal or we need to change your treatment, we will call you to review the results.  Testing/Procedures:   Your cardiac CT will be scheduled at:  [facility] [address]  [place], [place] [address]  Please enter the parking lot using the [address] entrance and use the FREE valet service at the patient drop-off area. Enter the building and check-in with registration on the main floor.  Please follow these instructions carefully (unless otherwise directed):  An IV will be required for this test and Nitroglycerin  will be given.  Hold all erectile dysfunction medications at least 3 days (72 hrs) prior to test. (Ie viagra, cialis, sildenafil, tadalafil, etc)   On the Night Before the Test: Be sure to Drink plenty of water . Do not consume any caffeinated/decaffeinated beverages or chocolate 12 hours prior to your test. Do not take any antihistamines 12 hours prior to your test.  On the Day of the Test: Drink plenty of water  until 1 hour prior to the test. Do not eat any food 1 hour prior to test. You may take your regular medications prior to the test.  Take metoprolol (Lopressor) two hours prior to test. If you take Furosemide /Hydrochlorothiazide/Spironolactone/Chlorthalidone, please HOLD on the morning of the test. Patients who wear a continuous glucose monitor MUST remove the device prior to scanning. FEMALES- please wear underwire-free bra if available, avoid dresses & tight  clothing   After the Test: Drink plenty of water . After receiving IV contrast, you may experience a mild flushed feeling. This is normal. On occasion, you may experience a mild rash up to 24 hours after the test. This is not dangerous. If this occurs, you can take Benadryl 25 mg, Zyrtec, Claritin, or Allegra and increase your fluid intake. (Patients taking Tikosyn should avoid Benadryl, and may take Zyrtec, Claritin, or Allegra) If you experience trouble breathing, this can be serious. If it is severe call 911 IMMEDIATELY. If it is mild, please call our office.  We will call to schedule your test 2-4 weeks out understanding that some insurance companies will need an authorization prior to the service being performed.   For more information and frequently asked questions, please visit our website : [email or web address]  For non-scheduling related questions, please contact the cardiac imaging nurse navigator should you have any questions/concerns: Cardiac Imaging Nurse Navigators Direct Office Dial: [phone]   For scheduling needs, including cancellations and rescheduling, please call Brittany, [phone].   Follow-Up: At [facility], you and your health needs are our priority.  As part of our continuing mission to provide you with exceptional heart care, our providers are all part of one team.  This team includes your primary Cardiologist (physician) and Advanced Practice Providers or APPs (Physician Assistants and Nurse Practitioners) who all work together to provide you with the care you need, when you need it.  Your next appointment:   1 year(s)  Provider:   Oneil Parchment, MD    We recommend signing up  for the patient portal called MyChart.  Sign up information is provided on this After Visit Summary.  MyChart is used to connect with patients for Virtual Visits (Telemedicine).  Patients are able to view lab/test results, encounter notes, upcoming  appointments, etc.  Non-urgent messages can be sent to your provider as well.   To learn more about what you can do with MyChart, go to [email or web address].

## 2024-10-01 NOTE — Progress Notes (Signed)
 Cardiology Office Note:  .   Date:  10/01/2024  ID:  Avelina DELENA Primes, DOB 05/03/1946, MRN [id number] PCP: Arloa Elsie SAUNDERS, MD  [facility] Providers Cardiologist:  Oneil Parchment, MD Electrophysiologist:  Will Gladis Norton, MD    History of Present Illness: .   SAYLA GOLONKA is a 78 y.o. female Discussed the use of AI scribe software   History of Present Illness SAVAHNA CASADOS is a 78 year old female with sinus node dysfunction and a pacemaker who presents for a follow-up visit.  She has a history of sinus node dysfunction and had an Abbott dual chamber pacemaker implanted in 2021. She experiences occasional brief chest pains. She reports a lack of exercise due to knee problems and a recent foot injury.  She has hyperlipidemia managed with rosuvastatin  5 mg daily, with an LDL cholesterol level of 51 mg/dL as of January 30, 2024. Her hypertension is well controlled with olmesartan 40 mg daily and amlodipine  2.5 mg daily.  She has a chronic history of SIADH and low sodium levels. Additionally, she has chronically elevated CPK levels, which have been evaluated by her primary care doctor.  She recently sustained a foot injury from dropping a stack of movies, resulting in a hematoma. Her foot becomes red when exposed to hot water , though there is no open wound. She has an appointment with her primary care doctor to recheck her foot.  She has a history of stomach and back pain, which have been thoroughly evaluated with a GI workup that was unremarkable.  She has experienced multiple urinary tract infections in the past. She is not on medications like Jardiance or Farxiga, which can increase the risk of UTIs, and is currently taking metformin for diabetes management. Her A1c was 6.7, slightly above the diabetes threshold of 6.5.      Studies Reviewed: .        Results LABS LDL cholesterol: 51 (01/30/2024) A1c: 6.7 (01/30/2024) Hemoglobin: 13 g/dL (96/68/7974) Creatinine: 0.86  mg/dL (96/68/7974)  RADIOLOGY Coronary calcium  score: 0 (2021) Risk Assessment/Calculations:           Physical Exam:   VS:  BP (!) 148/83   Pulse 78   Ht 5' 4 (1.626 m)   Wt 178 lb (80.7 kg)   SpO2 97%   BMI 30.55 kg/m    Wt Readings from Last 3 Encounters:  10/01/24 178 lb (80.7 kg)  08/29/24 176 lb 3.2 oz (79.9 kg)  08/09/24 176 lb (79.8 kg)    GEN: Well nourished, well developed in no acute distress NECK: No JVD; No carotid bruits CARDIAC: RRR, no murmurs, no rubs, no gallops RESPIRATORY:  Clear to auscultation without rales, wheezing or rhonchi  ABDOMEN: Soft, non-tender, non-distended EXTREMITIES:  No edema; No deformity   ASSESSMENT AND PLAN: .    Assessment and Plan Assessment & Plan Symptomatic bradycardia status post dual chamber pacemaker for sinus node dysfunction Pacemaker functioning well as per EP follow-up.  Intermittent chest pain Intermittent chest pain with no clear etiology. Previous coronary calcium  score was zero 4 years ago. Given the episodes of pain, further evaluation is warranted. - Ordered coronary CT scan with contrast to evaluate for major stenosis or other cardiac issues.  Hypertension, well controlled Hypertension is well controlled on current medications.  Hyperlipidemia, well controlled on statin therapy Hyperlipidemia is well controlled on rosuvastatin  5 mg daily. LDL cholesterol was 51 mg/dL on March 31st, 2025, indicating excellent control.  Type 2 diabetes mellitus, well  controlled on metformin Type 2 diabetes mellitus is well controlled on metformin. A1c is 6.7%, slightly above the diabetes range of 6.5%.  Chronic SIADH with hyponatremia Chronic SIADH with hyponatremia. 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         Dispo: 1 yr  Signed, Oneil Parchment, MD

## 2024-10-02 ENCOUNTER — Ambulatory Visit: Payer: Self-pay | Admitting: Cardiology

## 2024-10-02 DIAGNOSIS — L03119 Cellulitis of unspecified part of limb: Secondary | ICD-10-CM | POA: Diagnosis not present

## 2024-10-02 DIAGNOSIS — T148XXA Other injury of unspecified body region, initial encounter: Secondary | ICD-10-CM | POA: Diagnosis not present

## 2024-10-02 DIAGNOSIS — B070 Plantar wart: Secondary | ICD-10-CM | POA: Diagnosis not present

## 2024-10-03 ENCOUNTER — Ambulatory Visit: Admitting: Podiatry

## 2024-10-03 ENCOUNTER — Ambulatory Visit

## 2024-10-03 DIAGNOSIS — B070 Plantar wart: Secondary | ICD-10-CM

## 2024-10-03 DIAGNOSIS — S9032XA Contusion of left foot, initial encounter: Secondary | ICD-10-CM

## 2024-10-03 NOTE — Progress Notes (Signed)
 Patient presents today with complaint of a bump on the top of the foot she went to an urgent care and they thought it was a hematoma.  Apparently was red at the time so they put her on antibiotic also.  She has finished that.  She dropped a large DVD case on it.  For seems to be doing little bit better but is still pretty sore.  No fever chills nausea or vomiting.  Also has a complaint of a painful lesion on the plantar aspect of the left foot where she had a wart many years ago treated with radiation.  Has been getting painful for the past week.   Physical exam:  General appearance: Pleasant, and in no acute distress. AOx3.  Vascular: Pedal pulses: DP 2/4 bilaterally, PT 2/4 bilaterally.  Mild edema lower legs bilaterally.  Focal edema dorsal left foot.  Capillary fill time immediate bilaterally.  Neurological: Light touch intact feet bilaterally.  Normal Achilles reflex bilaterally.  No clonus or spasticity noted.   Dermatologic:   Some resolving ecchymosis dorsal left foot.  3 x 3 cm area of swelling the feel somewhat boggy.  Skin normal temperature bilaterally.  Skin normal color, tone, and texture bilaterally.   Musculoskeletal: Tenderness dorsal foot left.  No tenderness with range of motion lesser MTPs.  Radiographs: 3 views foot left: Soft tissue swelling dorsal aspect foot.  No [place] fractures or dislocations.  Some joint space narrowing at the second tarsometatarsal joint.  Normal bone density.  No subcutaneous emphysema noted.  Diagnosis: 1.  Hematoma dorsal foot left 2.  Plantar verruca left foot versus neoplastic lesion.  Plan: -New patient office visit for evaluation and management level 3. - Discussed with hematoma.  Appears to be resolving there does not appear to be any signs of infection today.  Still tender over the area.  Recommended she soak the foot in warm Epsom salt water  20 minutes 3-4 times a day.  If she notices any redness begin to appear again she should  call for an appointment immediately.  Explained that can take several months for the hematoma to completely resolved. -Discussed with the lesion on the plantar aspect of the left foot.  May just be a neoplastic lesion that formed hematoma underneath it or a plantar verruca.  The skin is very distorted so it is hard to say she had a wart taken out many years ago with radiation.  I think it might be just painful because she is altering her gait because of the dorsal hematoma.  If this continues to be a problem we can do a biopsy on it.  Return 2 weeks follow-up hematoma left foot

## 2024-10-08 DIAGNOSIS — K219 Gastro-esophageal reflux disease without esophagitis: Secondary | ICD-10-CM | POA: Diagnosis not present

## 2024-10-08 DIAGNOSIS — K317 Polyp of stomach and duodenum: Secondary | ICD-10-CM | POA: Diagnosis not present

## 2024-10-11 DIAGNOSIS — R350 Frequency of micturition: Secondary | ICD-10-CM | POA: Diagnosis not present

## 2024-10-11 DIAGNOSIS — N958 Other specified menopausal and perimenopausal disorders: Secondary | ICD-10-CM | POA: Diagnosis not present

## 2024-10-11 DIAGNOSIS — N3020 Other chronic cystitis without hematuria: Secondary | ICD-10-CM | POA: Diagnosis not present

## 2024-10-17 ENCOUNTER — Ambulatory Visit: Admitting: Podiatry

## 2024-10-17 DIAGNOSIS — B070 Plantar wart: Secondary | ICD-10-CM | POA: Diagnosis not present

## 2024-10-17 NOTE — Progress Notes (Signed)
 Patient presents follow-up hematoma dorsal foot left and lesion plantar forefoot left.  Doing well with dorsal foot with a hematoma and much less pain and swelling.  It is not hematomas that shrunken in size.  She says the skin color is back to normal.  Lesion on the plantar aspect of the foot is still painful.     Physical exam:  General appearance: Pleasant, and in no acute distress. AOx3.  Vascular: Pedal pulses: DP 2/4 bilaterally, PT 2/4 bilaterally.  Decreased edema dorsal foot left . Capillary fill time immediate bilaterally.  Neurological: Grossly intact bilaterally  Dermatologic:   Area of hematoma dorsal left foot improved hematoma continues to consolidate.  Much less tender than before.  No break in the skin noted.  Lesion about the 2 to 3 mm diameter plantar forefoot left.  Somewhat verrucous appearance with bending of skin lines.  Skin normal temperature bilaterally.  Skin normal color, tone, and texture bilaterally.   Musculoskeletal: Tenderness palpation dorsal foot left   Diagnosis: 1.  Plantar verruca left foot 2.  Hematoma left foot  Plan: -Hematoma is resolving without incident.  Told her can probably take several weeks for the clot from the hematoma to completely absorb.  She is recovering well.  I discussed with her the lesion on the plantar aspect of the foot.  Will try some Salinocaine and see if this eliminates the lesion.  If it still bothering her in 3 weeks we will do a punch biopsy.  -Applied Salinocaine compound to lesion(s) as noted in physical exam after debriding lesions to pinpoint bleeding.  Salinocaine applied to lesion(s) and covered with an occlusive dressing with Coban wrap.  Written and oral instructions given to patient.    Return 3 weeks follow-up possible biopsy lesion left

## 2024-10-18 ENCOUNTER — Encounter (HOSPITAL_COMMUNITY): Payer: Self-pay

## 2024-10-19 ENCOUNTER — Other Ambulatory Visit: Payer: Self-pay | Admitting: Family Medicine

## 2024-10-19 DIAGNOSIS — Z1231 Encounter for screening mammogram for malignant neoplasm of breast: Secondary | ICD-10-CM

## 2024-10-22 ENCOUNTER — Ambulatory Visit (HOSPITAL_COMMUNITY)
Admission: RE | Admit: 2024-10-22 | Discharge: 2024-10-22 | Disposition: A | Discharge status: Home / Self Care | Source: Ambulatory Visit | Attending: Cardiology | Admitting: Cardiology

## 2024-10-22 DIAGNOSIS — R072 Precordial pain: Secondary | ICD-10-CM | POA: Diagnosis not present

## 2024-10-22 MED ORDER — NITROGLYCERIN 0.4 MG SL SUBL
0.8000 mg | SUBLINGUAL_TABLET | Freq: Once | SUBLINGUAL | Status: AC
  Administered 2024-10-22: 0.8 mg via SUBLINGUAL

## 2024-10-22 MED ORDER — IOHEXOL 350 MG/ML SOLN
100.0000 mL | Freq: Once | INTRAVENOUS | Status: AC | PRN
  Administered 2024-10-22: 100 mL via INTRAVENOUS

## 2024-11-07 ENCOUNTER — Ambulatory Visit (INDEPENDENT_AMBULATORY_CARE_PROVIDER_SITE_OTHER): Admitting: Podiatry

## 2024-11-07 ENCOUNTER — Encounter: Payer: Self-pay | Admitting: Podiatry

## 2024-11-07 DIAGNOSIS — B070 Plantar wart: Secondary | ICD-10-CM | POA: Diagnosis not present

## 2024-11-07 NOTE — Progress Notes (Signed)
 Patient presents for follow-up of verrucous lesion plantar left foot.  Doing much better no discomfort or pain right now.  Says the skin at the area still feels a little thick   Physical exam:  General appearance: Pleasant, and in no acute distress. AOx3.  Vascular: Pedal pulses: DP 2/4 bilaterally, PT 2 to/4 bilaterally.  Minimal edema lower legs bilaterally. Capillary fill time immediate bilaterally.  Neurlogical: Grossly intact bilaterally  Dermatologic:   Verrucous lesion left foot appears to be resolved.  Still some thickening of the skin where the verrucous lesion was.  However there is no distortion of skin lines or pinpoint hemorrhages noted.  No tenderness.  Skin normal temperature bilaterally.  Skin normal color, tone, and texture bilaterally.   Musculoskeletal:     Diagnosis: 1.  Plantar verruca left  Plan: -Stemetil's visit for evaluation and management level 2. - Lesion appears to have resolved.  Told to keep an eye on if it feels like it is coming back or starts getting painful to call us .  If it would return we probably would consider doing a biopsy of the lesion.   Return as needed

## 2024-11-16 ENCOUNTER — Ambulatory Visit
Admission: RE | Admit: 2024-11-16 | Discharge: 2024-11-16 | Disposition: A | Discharge status: Home / Self Care | Source: Ambulatory Visit | Attending: Family Medicine | Admitting: Family Medicine

## 2024-11-16 DIAGNOSIS — Z1231 Encounter for screening mammogram for malignant neoplasm of breast: Secondary | ICD-10-CM

## 2024-11-29 ENCOUNTER — Ambulatory Visit: Payer: TRICARE For Life (TFL)

## 2024-11-29 ENCOUNTER — Ambulatory Visit: Payer: Self-pay | Admitting: Cardiology

## 2024-11-29 DIAGNOSIS — I495 Sick sinus syndrome: Secondary | ICD-10-CM

## 2024-11-29 LAB — CUP PACEART REMOTE DEVICE CHECK
Battery Remaining Longevity: 67 mo
Battery Remaining Percentage: 57 %
Battery Voltage: 3.01 V
Brady Statistic AP VP Percent: 1 %
Brady Statistic AP VS Percent: 98 %
Brady Statistic AS VP Percent: 1 %
Brady Statistic AS VS Percent: 1.6 %
Brady Statistic RA Percent Paced: 96 %
Brady Statistic RV Percent Paced: 1 %
Date Time Interrogation Session: 20260129040014
Implantable Lead Connection Status: 753985
Implantable Lead Connection Status: 753985
Implantable Lead Implant Date: 20210506
Implantable Lead Implant Date: 20210506
Implantable Lead Location: 753859
Implantable Lead Location: 753860
Implantable Pulse Generator Implant Date: 20210506
Lead Channel Impedance Value: 450 Ohm
Lead Channel Impedance Value: 480 Ohm
Lead Channel Pacing Threshold Amplitude: 0.5 V
Lead Channel Pacing Threshold Amplitude: 1.125 V
Lead Channel Pacing Threshold Pulse Width: 0.5 ms
Lead Channel Pacing Threshold Pulse Width: 0.5 ms
Lead Channel Sensing Intrinsic Amplitude: 0.6 mV
Lead Channel Sensing Intrinsic Amplitude: 9.4 mV
Lead Channel Setting Pacing Amplitude: 1.375
Lead Channel Setting Pacing Amplitude: 2 V
Lead Channel Setting Pacing Pulse Width: 0.5 ms
Lead Channel Setting Sensing Sensitivity: 2 mV
Pulse Gen Model: 2272
Pulse Gen Serial Number: 3825689

## 2024-12-07 NOTE — Progress Notes (Signed)
 Remote PPM Transmission
